# Patient Record
Sex: Female | Born: 1950
Health system: Southern US, Community
[De-identification: ages and names within clinical notes are randomized; demographics above are authoritative.]

## PROBLEM LIST (undated history)

## (undated) DIAGNOSIS — R51 Headache: Secondary | ICD-10-CM

## (undated) DIAGNOSIS — Z79899 Other long term (current) drug therapy: Secondary | ICD-10-CM

## (undated) DIAGNOSIS — G935 Compression of brain: Secondary | ICD-10-CM

## (undated) DIAGNOSIS — R9082 White matter disease, unspecified: Secondary | ICD-10-CM

## (undated) DIAGNOSIS — F411 Generalized anxiety disorder: Secondary | ICD-10-CM

## (undated) DIAGNOSIS — R413 Other amnesia: Secondary | ICD-10-CM

## (undated) DIAGNOSIS — M949 Disorder of cartilage, unspecified: Secondary | ICD-10-CM

## (undated) DIAGNOSIS — F32A Depression, unspecified: Secondary | ICD-10-CM

## (undated) DIAGNOSIS — N951 Menopausal and female climacteric states: Secondary | ICD-10-CM

## (undated) DIAGNOSIS — M1990 Unspecified osteoarthritis, unspecified site: Secondary | ICD-10-CM

## (undated) DIAGNOSIS — K219 Gastro-esophageal reflux disease without esophagitis: Secondary | ICD-10-CM

## (undated) DIAGNOSIS — R519 Headache, unspecified: Secondary | ICD-10-CM

## (undated) DIAGNOSIS — G5600 Carpal tunnel syndrome, unspecified upper limb: Secondary | ICD-10-CM

## (undated) DIAGNOSIS — M899 Disorder of bone, unspecified: Secondary | ICD-10-CM

## (undated) DIAGNOSIS — M8580 Other specified disorders of bone density and structure, unspecified site: Secondary | ICD-10-CM

## (undated) DIAGNOSIS — G4700 Insomnia, unspecified: Secondary | ICD-10-CM

## (undated) DIAGNOSIS — F329 Major depressive disorder, single episode, unspecified: Secondary | ICD-10-CM

## (undated) HISTORY — DX: Carpal tunnel syndrome, unspecified upper limb: G56.00

## (undated) HISTORY — DX: Insomnia, unspecified: G47.00

## (undated) HISTORY — PX: CARPAL TUNNEL RELEASE: SHX101

## (undated) HISTORY — DX: Menopausal and female climacteric states: N95.1

## (undated) HISTORY — DX: Headache: R51

## (undated) HISTORY — DX: Other amnesia: R41.3

## (undated) HISTORY — DX: Compression of brain: G93.5

## (undated) HISTORY — PX: COLONOSCOPY: SHX174

## (undated) HISTORY — DX: White matter disease, unspecified: R90.82

## (undated) HISTORY — DX: Disorder of bone, unspecified: M89.9

## (undated) HISTORY — PX: APPENDECTOMY: SHX54

## (undated) HISTORY — DX: Gastro-esophageal reflux disease without esophagitis: K21.9

## (undated) HISTORY — PX: CATARACT EXTRACTION, BILATERAL: SHX1313

## (undated) HISTORY — DX: Generalized anxiety disorder: F41.1

## (undated) HISTORY — DX: Other specified disorders of bone density and structure, unspecified site: M85.80

## (undated) HISTORY — DX: Disorder of cartilage, unspecified: M94.9

## (undated) HISTORY — DX: Other long term (current) drug therapy: Z79.899

## (undated) HISTORY — DX: Headache, unspecified: R51.9

---

## 1999-05-02 ENCOUNTER — Other Ambulatory Visit: Admission: RE | Admit: 1999-05-02 | Discharge: 1999-05-02 | Payer: Self-pay | Admitting: *Deleted

## 2000-05-06 ENCOUNTER — Other Ambulatory Visit: Admission: RE | Admit: 2000-05-06 | Discharge: 2000-05-06 | Payer: Self-pay | Admitting: *Deleted

## 2001-06-09 ENCOUNTER — Other Ambulatory Visit: Admission: RE | Admit: 2001-06-09 | Discharge: 2001-06-09 | Payer: Self-pay | Admitting: *Deleted

## 2002-06-16 ENCOUNTER — Encounter: Admission: RE | Admit: 2002-06-16 | Discharge: 2002-06-16 | Payer: Self-pay | Admitting: *Deleted

## 2005-07-10 ENCOUNTER — Encounter: Admission: RE | Admit: 2005-07-10 | Discharge: 2005-07-10 | Payer: Self-pay | Admitting: Obstetrics and Gynecology

## 2006-09-25 ENCOUNTER — Encounter: Admission: RE | Admit: 2006-09-25 | Discharge: 2006-09-25 | Payer: Self-pay | Admitting: Obstetrics and Gynecology

## 2007-10-09 ENCOUNTER — Encounter: Admission: RE | Admit: 2007-10-09 | Discharge: 2007-10-09 | Payer: Self-pay | Admitting: Obstetrics and Gynecology

## 2008-10-25 ENCOUNTER — Encounter: Admission: RE | Admit: 2008-10-25 | Discharge: 2008-10-25 | Payer: Self-pay | Admitting: Obstetrics and Gynecology

## 2009-10-20 ENCOUNTER — Encounter: Admission: RE | Admit: 2009-10-20 | Discharge: 2009-10-20 | Payer: Self-pay | Admitting: Obstetrics and Gynecology

## 2010-11-20 ENCOUNTER — Other Ambulatory Visit: Payer: Self-pay | Admitting: Obstetrics and Gynecology

## 2010-11-20 DIAGNOSIS — Z1231 Encounter for screening mammogram for malignant neoplasm of breast: Secondary | ICD-10-CM

## 2010-11-22 ENCOUNTER — Ambulatory Visit: Payer: Self-pay

## 2010-11-22 ENCOUNTER — Ambulatory Visit
Admission: RE | Admit: 2010-11-22 | Discharge: 2010-11-22 | Disposition: A | Discharge status: Home / Self Care | Payer: BC Managed Care – PPO | Source: Ambulatory Visit | Attending: Obstetrics and Gynecology | Admitting: Obstetrics and Gynecology

## 2010-11-22 DIAGNOSIS — Z1231 Encounter for screening mammogram for malignant neoplasm of breast: Secondary | ICD-10-CM

## 2011-10-24 ENCOUNTER — Other Ambulatory Visit: Payer: Self-pay | Admitting: Obstetrics and Gynecology

## 2011-10-24 DIAGNOSIS — Z1231 Encounter for screening mammogram for malignant neoplasm of breast: Secondary | ICD-10-CM

## 2011-11-23 ENCOUNTER — Ambulatory Visit
Admission: RE | Admit: 2011-11-23 | Discharge: 2011-11-23 | Disposition: A | Discharge status: Home / Self Care | Source: Ambulatory Visit | Attending: Obstetrics and Gynecology | Admitting: Obstetrics and Gynecology

## 2011-11-23 DIAGNOSIS — Z1231 Encounter for screening mammogram for malignant neoplasm of breast: Secondary | ICD-10-CM

## 2012-11-10 ENCOUNTER — Other Ambulatory Visit: Payer: Self-pay

## 2012-11-10 DIAGNOSIS — Z1231 Encounter for screening mammogram for malignant neoplasm of breast: Secondary | ICD-10-CM

## 2012-12-03 ENCOUNTER — Ambulatory Visit
Admission: RE | Admit: 2012-12-03 | Discharge: 2012-12-03 | Disposition: A | Discharge status: Home / Self Care | Source: Ambulatory Visit

## 2012-12-03 DIAGNOSIS — Z1231 Encounter for screening mammogram for malignant neoplasm of breast: Secondary | ICD-10-CM

## 2013-09-25 ENCOUNTER — Other Ambulatory Visit: Payer: Self-pay | Admitting: Family Medicine

## 2013-09-25 DIAGNOSIS — R413 Other amnesia: Secondary | ICD-10-CM

## 2013-09-29 ENCOUNTER — Other Ambulatory Visit: Payer: Self-pay | Admitting: Family Medicine

## 2013-09-29 DIAGNOSIS — Z139 Encounter for screening, unspecified: Secondary | ICD-10-CM

## 2013-10-02 ENCOUNTER — Ambulatory Visit
Admission: RE | Admit: 2013-10-02 | Discharge: 2013-10-02 | Disposition: A | Discharge status: Home / Self Care | Source: Ambulatory Visit | Attending: Family Medicine | Admitting: Family Medicine

## 2013-10-02 DIAGNOSIS — Z139 Encounter for screening, unspecified: Secondary | ICD-10-CM

## 2013-10-02 DIAGNOSIS — R413 Other amnesia: Secondary | ICD-10-CM

## 2013-10-02 MED ORDER — GADOBENATE DIMEGLUMINE 529 MG/ML IV SOLN
10.0000 mL | Freq: Once | INTRAVENOUS | Status: AC | PRN
  Administered 2013-10-02: 10 mL via INTRAVENOUS

## 2013-11-30 ENCOUNTER — Ambulatory Visit: Admitting: Neurology

## 2013-12-02 ENCOUNTER — Encounter: Payer: Self-pay | Admitting: Neurology

## 2013-12-03 ENCOUNTER — Ambulatory Visit (INDEPENDENT_AMBULATORY_CARE_PROVIDER_SITE_OTHER): Admitting: Neurology

## 2013-12-03 ENCOUNTER — Encounter (INDEPENDENT_AMBULATORY_CARE_PROVIDER_SITE_OTHER): Payer: Self-pay

## 2013-12-03 ENCOUNTER — Encounter: Payer: Self-pay | Admitting: *Deleted

## 2013-12-03 VITALS — BP 117/69 | HR 87 | Ht 59.0 in | Wt 129.0 lb

## 2013-12-03 DIAGNOSIS — R413 Other amnesia: Secondary | ICD-10-CM

## 2013-12-03 DIAGNOSIS — R51 Headache: Secondary | ICD-10-CM

## 2013-12-03 DIAGNOSIS — G4700 Insomnia, unspecified: Secondary | ICD-10-CM

## 2013-12-03 DIAGNOSIS — H538 Other visual disturbances: Secondary | ICD-10-CM

## 2013-12-03 DIAGNOSIS — G935 Compression of brain: Secondary | ICD-10-CM

## 2013-12-03 NOTE — Patient Instructions (Addendum)
You can discuss increasing the Aricept to 10 mg once daily.  Common side effects include dry eyes, dry mouth, confusion, low pulse, low blood pressure and rare side effects include hallucinations.   You have complaints of memory loss: memory loss or changes in cognitive function can have many reasons and does not always mean you have dementia. Conditions that can contribute to subjective or objective memory loss include: depression, stress, poor sleep from insomnia or sleep apnea, dehydration, fluctuation in blood sugar values, thyroid or electrolyte dysfunction. Dementia can be causes by stroke, brain atherosclerosis and by Alzheimer's disease or other, more rare and sometimes hereditary causes.  We can consider formal memory testing as well. I can refer you for neuropsychological testing if you wish or you can discuss with Mr. Tobie Lords.   Please remember, common headache triggers are: sleep deprivation, dehydration, overheating, stress, hypoglycemia or skipping meals and blood sugar fluctuations, excessive pain medications or excessive alcohol use or caffeine withdrawal. Some people have food triggers such as aged cheese, orange juice or chocolate, especially dark chocolate, or MSG (monosodium glutamate). Try to avoid these headache triggers as much possible. It may be helpful to keep a headache diary to figure out what makes your headaches worse or brings them on and what alleviates them. Some people report headache onset after exercise but studies have shown that regular exercise may actually prevent headaches from coming. If you have exercise-induced headaches, please make sure that you drink plenty of fluid before and after exercising and that you do not over do it and do not overheat.  Please remember to try to maintain good sleep hygiene, which means: Keep a regular sleep and wake schedule, try not to exercise or have a meal within 2 hours of your bedtime, try to keep your bedroom conducive for sleep,  that is, cool and dark, without light distractors such as an illuminated alarm clock, and refrain from watching TV right before sleep or in the middle of the night and do not keep the TV or radio on during the night. Also, try not to use or play on electronic devices at bedtime, such as your cell phone, tablet PC or laptop. If you like to read at bedtime on an electronic device, try to dim the background light as much as possible. Do not eat in the middle of the night.   Please make an appointment with your eye doctor.    I can see you back as needed.

## 2013-12-03 NOTE — Progress Notes (Signed)
Subjective:    Patient ID: Carly Larson is a 63 y.o. female.  HPI    Star Age, MD, PhD [facility] [address], [address] [place], [place] [address]   Dear Ovid Curd,  I saw your patient, Carly Larson, upon your kind request in my neurologic clinic today for initial consultation of her memory loss. The patient is accompanied by her husband today. As you know, Carly Larson is a 63 year old right-handed woman with an underlying medical history of reflux disease, carpal tunnel syndrome, osteopenia, anxiety, and vitamin D deficiency, who has had memory loss for over 2 years. She has been on Aricept 5 mg since March 2015. According to your office note from 11/11/2013 her MMSE in May 2013 was 24/30 and in March 2015 was 30 out of 30 but subjectively she felt that her memory was getting worse, which is why you started her on low-dose Aricept. After this she felt stabilized or perhaps a little improved. She had a brain MRI with and without contrast on 10/02/2013: The cerebellar tonsils extend 5 mm below the foramen magnum. Below there some around, the posterior fossa appear small. This suggests a mild Chiari 1 malformation. 2. Mild periventricular and subcortical white matter changes slightly advanced for age. The finding is nonspecific but can be seen in the setting of chronic microvascular ischemia, a demyelinating process such as multiple sclerosis, vasculitis, complicated migraine headaches, or as the sequelae of a prior infectious or inflammatory process. In addition, I personally reviewed the images through the PACS system.   I reviewed her labs which included normal CMP, normal TSH, normal CBC, vitamin B12 of 1185, ESR of 2, normal vitamin D.   She reports recurrent headaches. She describes a pounding HA in the top and front of her head, associated with some nausea and vomiting, and she had a total of 3 severe HAs since mid-April. She had spots in her vision. She  has no photophobia or sonophobia.  She has trouble going to sleep and takes an OTC sleep aid each night. She endorses stress. She works fulltime as a Psychiatric nurse. She describes thoughts racing. She has been on Effexor 75 mg 1 in AM and 2 at night.  There is no report of Auditory Hallucinations and Visual Hallucinations and there are no delusions, such as paranoia.  She has not been on any dementia medications. She has no symptoms of depression and denies suicidal ideations or homicidal ideations.  The patient denies prior TIA or stroke symptoms, such as sudden onset of one sided weakness, numbness, tingling, slurring of speech or droopy face, hearing loss, tinnitus, diplopia or visual field cut or monocular loss of vision.  Of note, the patient is reported to snore mildly but there is no report of witnessed apneas or choking sensations while asleep.  Her mother has Alzheimer's disease and is 61 years old. Her father passed away. He had a stroke. She has one daughter. She has been driving and has had no problems driving such as getting lost or misjudging. Her husband endorses that she drives well.  Her Past Medical History Is Significant For: Past Medical History  Diagnosis Date  . Encounter for long-term (current) use of other medications   . Esophageal reflux   . Carpal tunnel syndrome   . Disorder of bone and cartilage, unspecified   . Anxiety state, unspecified   . Symptomatic menopausal or female climacteric states   . Osteopenia     Hx of vitamin  D deficiency  . Memory loss     MMSE 24/30 on 12/05/11, 30/30 on 10/25/13    His Past Surgical History Is Significant For: Past Surgical History  Procedure Laterality Date  . Appendectomy      Her Family History Is Significant For: Family History  Problem Relation Age of Onset  . Breast cancer Maternal Aunt   . Diabetes Mother   . Osteoarthritis Mother   . Hypertension Mother   . Dementia Mother   . Stroke Father     Her Social History  Is Significant For: History   Social History  . Marital Status: Married    Spouse Name: Jeani Hawking     Number of Children: 1  . Years of Education: 12+   Occupational History  .     Social History Main Topics  . Smoking status: Never Smoker   . Smokeless tobacco: Never Used  . Alcohol Use: None  . Drug Use: None  . Sexual Activity: None   Other Topics Concern  . None   Social History Narrative   Patient lives at home with husband. Jeani Hawking   Patient has 1 child.    Patient is left handed.    Patient is currently working    Patient has a college education     Her Allergies Are:  No Known Allergies:   Her Current Medications Are:  Outpatient Encounter Prescriptions as of 12/03/2013  Medication Sig  . acyclovir ointment (ZOVIRAX) 5 % Apply 1 application topically every 3 (three) hours.  Marland Kitchen aspirin 81 MG tablet Take 81 mg by mouth daily.  . B Complex Vitamins (VITAMIN B-COMPLEX PO) Take 1 tablet by mouth daily.  . Cholecalciferol (VITAMIN D-3) 1000 UNITS CAPS Take 1 capsule by mouth 2 (two) times daily.  Marland Kitchen donepezil (ARICEPT) 5 MG tablet Take 5 mg by mouth at bedtime.  Marland Kitchen omeprazole (PRILOSEC) 20 MG capsule Take 20 mg by mouth daily.  Marland Kitchen POTASSIUM PO Take 1 tablet by mouth 2 (two) times daily.  Marland Kitchen venlafaxine (EFFEXOR) 75 MG tablet Take 75 mg by mouth 3 (three) times daily with meals.  :  Review of Systems:  Out of a complete 14 point review of systems, all are reviewed and negative with the exception of these symptoms as listed below:   Review of Systems  Eyes:       Loss of vision right eye  Endocrine: Positive for heat intolerance.       Flushing   Allergic/Immunologic: Positive for environmental allergies.       Runny nose   Neurological: Positive for headaches.       Memory loss, confusion  Psychiatric/Behavioral: Positive for confusion and sleep disturbance.    Objective:  Neurologic Exam  Physical Exam Physical Examination:   Filed Vitals:   12/03/13 1310  BP:  117/69  Pulse: 87    General Examination: The patient is a very pleasant 63 y.o. female in no acute distress. She is calm and cooperative with the exam. She denies Auditory Hallucinations and Visual Hallucinations. She is well groomed and situated in a chair.   HEENT: Normocephalic, atraumatic, pupils are equal, round and reactive to light and accommodation. Funduscopic exam is normal with sharp disc margins noted. Extraocular tracking shows no saccadic breakdown without nystagmus noted. Hearing is intact. Tympanic membranes are clear bilaterally. Face is symmetric with no facial masking and normal facial sensation. There is no lip, neck or jaw tremor. Neck is not rigid with intact passive ROM. There  are no carotid bruits on auscultation. Oropharynx exam reveals mild mouth dryness. No significant airway crowding is noted. Mallampati is class I. Tongue protrudes centrally and palate elevates symmetrically.    Chest: is clear to auscultation without wheezing, rhonchi or crackles noted.  Heart: sounds are regular and normal without murmurs, rubs or gallops noted.   Abdomen: is soft, non-tender and non-distended with normal bowel sounds appreciated on auscultation.  Extremities: There is no pitting edema in the distal lower extremities bilaterally. Pedal pulses are intact.   Skin: is warm and dry with no trophic changes noted. Age-related changes are noted on the skin.   Musculoskeletal: exam reveals no obvious joint deformities, tenderness or joint swelling or erythema.   Neurologically:  Mental status: The patient is awake and alert, paying good  attention. She is able to provide the history. Her husband provides details. She is oriented to: person, place, time/date, situation, day of week, month of year and year. Her memory, attention, language and knowledge are fairly good. There is no aphasia, agnosia, apraxia or anomia. There is a no significant degree of bradyphrenia. Speech is not hypophonic  with no dysarthria noted. Mood is congruent and affect is normal.  Her MMSE (Mini-Mental state exam) score is 28/30. AFT was 15/min.    Cranial nerves are as described above under HEENT exam. In addition, shoulder shrug is normal with equal shoulder height noted.  Motor exam: Normal bulk, and strength for age is noted. Tone is not rigid with absence of cogwheeling in the bilateral extremities. There is overall no bradykinesia. There is no drift or rebound. There is no tremor.   Romberg is negative. Reflexes are 2+ in the upper extremities and 3+ in the lower extremities. Toes are downgoing bilaterally. Fine motor skills: Finger taps, hand movements, and rapid alternating patting are not impaired bilaterally. Foot taps and foot agility are not impaired bilaterally.   Cerebellar testing shows no dysmetria or intention tremor on finger to nose testing. Heel to shin is unremarkable. There is no truncal or gait ataxia.   Sensory exam is intact to light touch, pinprick, vibration, temperature sense and proprioception in the upper and lower extremities.   Gait, station and balance: She stands up from the seated position with no difficulty. No veering to one side is noted. No leaning to one side. Posture is age-appropriate, not stooped. Stance is narrow-based. She turns en bloc. Tandem walk is not possible. Balance is not  impaired.   Assessment and Plan:   In summary, Breely R Bembenek is a very pleasant 63 y.o.-year old female with an underlying medical history of reflux disease, carpal tunnel syndrome, osteopenia, anxiety, and vitamin D deficiency, who has had memory loss for over 2 years. Her history and physical exam are in keeping with mild memory loss, probably in keeping with mild cognitive impairment or early dementia. I discussed her MRI findings with her. I explained Chiari 1 malformation to her. Her headaches may be late onset migraines. She certainly does not have any focality on exam and does not  endorse any TIA-type symptoms. Nevertheless because she has had blurry vision I asked her to have her eyes checked. She has not seen her eye doctor in 3 or 4 years she states. At this juncture, I advised her that she can go ahead and increase the Aricept with full dose to be on a maintenance dose. She would like to discuss this with you first. We also talked about potentially sending her for full  or formal memory testing in the form of neuropsychological evaluation. She would like to hold off and discuss this with you as well. At this juncture, I can see her back on an as-needed basis. We discussed her recent blood test findings, her MRI results and today's findings in detail. I answered all their questions. We talked about sleep hygiene and I also advised her about potential headache triggers. She was given instructions in writing.   Thank you very much for allowing me to participate in the care of this nice patient. If I can be of any further assistance to you please do not hesitate to call me at [phone].  Sincerely,   Star Age, MD, PhD

## 2014-01-07 ENCOUNTER — Other Ambulatory Visit: Payer: Self-pay | Admitting: Physician Assistant

## 2014-01-07 ENCOUNTER — Other Ambulatory Visit: Payer: Self-pay

## 2014-01-07 DIAGNOSIS — Z1231 Encounter for screening mammogram for malignant neoplasm of breast: Secondary | ICD-10-CM

## 2014-01-19 ENCOUNTER — Ambulatory Visit
Admission: RE | Admit: 2014-01-19 | Discharge: 2014-01-19 | Disposition: A | Discharge status: Home / Self Care | Source: Ambulatory Visit | Attending: Physician Assistant | Admitting: Physician Assistant

## 2014-01-19 DIAGNOSIS — Z1231 Encounter for screening mammogram for malignant neoplasm of breast: Secondary | ICD-10-CM

## 2014-10-26 ENCOUNTER — Ambulatory Visit: Admitting: Neurology

## 2014-10-26 ENCOUNTER — Telehealth: Payer: Self-pay

## 2014-10-26 NOTE — Telephone Encounter (Signed)
Patient did not show to appt today  

## 2014-10-27 ENCOUNTER — Encounter: Payer: Self-pay | Admitting: Neurology

## 2014-11-03 ENCOUNTER — Other Ambulatory Visit: Payer: Self-pay

## 2014-11-23 ENCOUNTER — Ambulatory Visit (INDEPENDENT_AMBULATORY_CARE_PROVIDER_SITE_OTHER): Payer: Medicare Other | Admitting: Neurology

## 2014-11-23 ENCOUNTER — Encounter: Payer: Self-pay | Admitting: Neurology

## 2014-11-23 VITALS — BP 132/70 | HR 102 | Resp 18 | Ht 59.0 in | Wt 116.0 lb

## 2014-11-23 DIAGNOSIS — R413 Other amnesia: Secondary | ICD-10-CM

## 2014-11-23 DIAGNOSIS — G935 Compression of brain: Secondary | ICD-10-CM | POA: Diagnosis not present

## 2014-11-23 NOTE — Patient Instructions (Signed)
I think overall you are doing fairly well but I do want to suggest a few things today:  Remember to drink plenty of fluid, eat healthy meals and do not skip any meals. Try to eat protein with a every meal and eat a healthy snack such as fruit or nuts in between meals. Try to keep a regular sleep-wake schedule and try to exercise daily, particularly in the form of walking, 20-30 minutes a day, if you can. Good nutrition, proper sleep and exercise can help her cognitive function.  Engage in social activities in your community and with your family and try to keep up with current events by reading the newspaper or watching the news. If you have computer and can go online, try [email or web address]. Also, you may like to do word finding puzzles or crossword puzzles.  As far as your medications are concerned, I would like to suggest: consider, changing namenda 10 mg 2 times a day to once daily longacting Namenda XR 28 mg.    As far as diagnostic testing: I would like for you to consider a more detailed memory test, called cognitive testing with a licensed neuropsychologist. Let me know. I can make a referral.   I would like to see you back in 6 months. Please call us with any interim questions, concerns, problems, updates or refill requests.

## 2014-11-23 NOTE — Progress Notes (Signed)
Subjective:    Patient ID: Carly Larson is a 64 y.o. female.  HPI     Interim history:   Carly Larson is a 64 year old right-handed woman with an underlying medical history of reflux disease, carpal tunnel syndrome, osteopenia, anxiety, and vitamin D deficiency, who presents for follow-up consultation of her memory loss of over 2 years duration. The patient is unaccompanied today. I first met her on 12/03/2013 at the request of her primary care provider, at which time she reported a 2 year history of memory loss. She had been on Aricept 5 mg since March 2015. Her MMSE was 28 out of 30 at the time. I encouraged her to increase Aricept to 10 mg. We talked about sending her for formal neuropsychological evaluation but she chose to hold off and wanted to discuss the increase in her Aricept with her primary care provider first as well. I suggested an as needed follow-up. Of note, the patient did not show for an appointment on 10/26/2014.   Today, 11/23/2014: She reports that her memory is worse. Her husband gives details and reports, that in the last year she has had a significant decline in her short-term memory. She perpetually misplaces things and forgets in particular where she put her keys and her glasses or cell phone. I reviewed an office note from her primary care physician from 09/10/2014. She is currently on Aricept 10 mg generic once daily and she has also been started on Namenda which is currently 10 mg twice daily. As I understand she may have started this about 6 months ago. She seems to tolerate her medications well. She sleeps fairly well. She does not report any other physical complaints or problems. She still works as a Psychiatric nurse. she still drives and there have not been any significant issues driving. However, they have not been driving any longer distances lately. If there is longer distance driving needed, her husband typically takes care of that. She has one daughter, age 30 who she reports  is also concerned about her memory. Her mother has memory issues.   Previously:   She has had memory loss for over 2 years. She has been on Aricept 5 mg since March 2015. According to your office note from 11/11/2013 her MMSE in May 2013 was 24/30 and in March 2015 was 30 out of 30 but subjectively she felt that her memory was getting worse, which is why you started her on low-dose Aricept. After this she felt stabilized or perhaps a little improved. She had a brain MRI with and without contrast on 10/02/2013: The cerebellar tonsils extend 5 mm below the foramen magnum. Below there some around, the posterior fossa appear small. This suggests a mild Chiari 1 malformation. 2. Mild periventricular and subcortical white matter changes slightly advanced for age. The finding is nonspecific but can be seen in the setting of chronic microvascular ischemia, a demyelinating process such as multiple sclerosis, vasculitis, complicated migraine headaches, or as the sequelae of a prior infectious or inflammatory process.  In addition, I personally reviewed the images through the PACS system.    I reviewed her labs which included normal CMP, normal TSH, normal CBC, vitamin B12 of 1185, ESR of 2, normal vitamin D.   She reports recurrent headaches. She describes a pounding HA in the top and front of her head, associated with some nausea and vomiting, and she had a total of 3 severe HAs since mid-April. She had spots in her vision. She has no  photophobia or sonophobia.   She has trouble going to sleep and takes an OTC sleep aid each night. She endorses stress. She works fulltime as a Psychiatric nurse. She describes thoughts racing. She has been on Effexor 75 mg 1 in AM and 2 at night.   There is no report of Auditory Hallucinations and Visual Hallucinations and there are no delusions, such as paranoia.   She has not been on any dementia medications. She has no symptoms of depression and denies suicidal ideations or homicidal  ideations.   The patient denies prior TIA or stroke symptoms, such as sudden onset of one sided weakness, numbness, tingling, slurring of speech or droopy face, hearing loss, tinnitus, diplopia or visual field cut or monocular loss of vision.  Of note, the patient is reported to snore mildly but there is no report of witnessed apneas or choking sensations while asleep.   Her mother has Alzheimer's disease and is 56 years old. Her father passed away. He had a stroke. She has one daughter. She has been driving and has had no problems driving such as getting lost or misjudging. Her husband endorses that she drives well.   Her Past Medical History Is Significant For: Past Medical History  Diagnosis Date  . Encounter for long-term (current) use of other medications   . Esophageal reflux   . Carpal tunnel syndrome   . Disorder of bone and cartilage, unspecified   . Anxiety state, unspecified   . Symptomatic menopausal or female climacteric states   . Osteopenia     Hx of vitamin D deficiency  . Memory loss     MMSE 24/30 on 12/05/11, 30/30 on 10/25/13  . Menopausal symptoms   . Memory loss   . Insomnia   . Chiari malformation type I   . Carpal tunnel syndrome, bilateral   . Headache   . Osteopenia   . White matter abnormality on MRI of brain   . High risk medication use     Her Past Surgical History Is Significant For: Past Surgical History  Procedure Laterality Date  . Appendectomy      Her Family History Is Significant For: Family History  Problem Relation Age of Onset  . Breast cancer Maternal Aunt   . Diabetes Mother   . Osteoarthritis Mother   . Hypertension Mother   . Dementia Mother   . Stroke Father   . Heart attack Brother   . CAD Brother     Her Social History Is Significant For: History   Social History  . Marital Status: Married    Spouse Name: Jeani Hawking   . Number of Children: 1  . Years of Education: 12+   Occupational History  . Florist    Social History  Main Topics  . Smoking status: Never Smoker   . Smokeless tobacco: Never Used  . Alcohol Use: No  . Drug Use: No  . Sexual Activity: Not on file   Other Topics Concern  . None   Social History Narrative   Patient lives at home with husband. Jeani Hawking   Patient has 1 child.    Patient is left handed.    Patient is currently working    Patient has a college education    3 cups of coffee a day     Her Allergies Are:  No Known Allergies:   Her Current Medications Are:  Outpatient Encounter Prescriptions as of 11/23/2014  Medication Sig  . acyclovir ointment (ZOVIRAX) 5 % Apply 1  application topically every 3 (three) hours.  Marland Kitchen alendronate (FOSAMAX) 70 MG tablet Take 70 mg by mouth once a week. Take with a full glass of water on an empty stomach.  . Apoaequorin 10 MG CAPS Take 10 mg by mouth.  Marland Kitchen aspirin 81 MG tablet Take 81 mg by mouth daily.  . B Complex Vitamins (VITAMIN B-COMPLEX PO) Take 1 tablet by mouth daily.  . Cholecalciferol (VITAMIN D-3) 1000 UNITS CAPS Take 1 capsule by mouth 2 (two) times daily.  Marland Kitchen donepezil (ARICEPT) 5 MG tablet Take 5 mg by mouth at bedtime.  . meloxicam (MOBIC) 15 MG tablet Take 15 mg by mouth daily.  . memantine (NAMENDA) 10 MG tablet Take 10 mg by mouth 2 (two) times daily.  . Multiple Vitamin (MULTIVITAMIN) capsule Take 1 capsule by mouth daily.  Marland Kitchen omeprazole (PRILOSEC) 20 MG capsule Take 20 mg by mouth daily.  Marland Kitchen POTASSIUM PO Take 1 tablet by mouth 2 (two) times daily.  . temazepam (RESTORIL) 30 MG capsule Take 30 mg by mouth at bedtime as needed for sleep.  . traMADol (ULTRAM) 50 MG tablet Take by mouth 2 (two) times daily.  Marland Kitchen venlafaxine (EFFEXOR) 75 MG tablet Take 75 mg by mouth 3 (three) times daily with meals.   No facility-administered encounter medications on file as of 11/23/2014.  :  Review of Systems:  Out of a complete 14 point review of systems, all are reviewed and negative with the exception of these symptoms as listed below:   Review  of Systems  HENT: Positive for rhinorrhea.   Gastrointestinal: Positive for constipation.  Endocrine:       Feeling hot   Musculoskeletal:       Joint swelling, Aching muscles   Allergic/Immunologic: Positive for environmental allergies.  Neurological: Positive for headaches.       Memory loss    Objective:  Neurologic Exam  Physical Exam Physical Examination:   Filed Vitals:   11/23/14 1333  BP: 132/70  Pulse: 102  Resp: 18   General Examination: The patient is a very pleasant 64 y.o. female in no acute distress. She is calm and cooperative with the exam. She denies Auditory Hallucinations and Visual Hallucinations. She is well groomed and situated in a chair. She is slightly anxious appearing. She is in good spirits today.   HEENT: Normocephalic, atraumatic, pupils are equal, round and reactive to light and accommodation. Funduscopic exam is normal with sharp disc margins noted. Extraocular tracking shows no saccadic breakdown without nystagmus noted. Hearing is intact. Tympanic membranes are clear bilaterally. Face is symmetric with no facial masking and normal facial sensation. There is no lip, neck or jaw tremor. Neck is not rigid with intact passive ROM. There are no carotid bruits on auscultation. Oropharynx exam reveals mild mouth dryness. No significant airway crowding is noted. Mallampati is class I. Tongue protrudes centrally and palate elevates symmetrically.    Chest: is clear to auscultation without wheezing, rhonchi or crackles noted.  Heart: sounds are regular and normal without murmurs, rubs or gallops noted.   Abdomen: is soft, non-tender and non-distended with normal bowel sounds appreciated on auscultation.  Extremities: There is no pitting edema in the distal lower extremities bilaterally. Pedal pulses are intact.   Skin: is warm and dry with no trophic changes noted. Age-related changes are noted on the skin.   Musculoskeletal: exam reveals no obvious joint  deformities, tenderness or joint swelling or erythema.   Neurologically:  Mental status: The patient is  awake and alert, paying good  attention. She is able to provide the history. Her husband provides details. She is oriented to: person, place, time/date, situation, day of week, month of year and year. Her memory, attention, language and knowledge are fairly good. There is no aphasia, agnosia, apraxia or anomia. There is a no significant degree of bradyphrenia. Speech is not hypophonic with no dysarthria noted. Mood is congruent and affect is normal.   In 5/15: Her MMSE (Mini-Mental state exam) score is 28/30. AFT was 15/min.    11/23/2014: MMSE: 24/30, CDT: 4/4, AFT: 14/min.  Cranial nerves are as described above under HEENT exam. In addition, shoulder shrug is normal with equal shoulder height noted.  Motor exam: Normal bulk, and strength for age is noted. Tone is not rigid with absence of cogwheeling in the bilateral extremities. There is overall no bradykinesia. There is no drift or rebound. There is no tremor.   Romberg is negative. Reflexes are 2+ in the upper extremities and 3+ in the lower extremities. Toes are downgoing bilaterally. Fine motor skills: Finger taps, hand movements, and rapid alternating patting are not impaired bilaterally. Foot taps and foot agility are not impaired bilaterally.   Cerebellar testing shows no dysmetria or intention tremor on finger to nose testing. Heel to shin is unremarkable. There is no truncal or gait ataxia.   Sensory exam is intact to light touch in the upper and lower extremities.   Gait, station and balance: She stands up from the seated position with no difficulty. No veering to one side is noted. No leaning to one side. Posture is age-appropriate, not stooped. Stance is narrow-based. She turns en bloc.   Assessment and Plan:   In summary, Tiffney R Messimer is a very pleasant 64 year old female with an underlying medical history of reflux disease,  carpal tunnel syndrome, osteopenia, anxiety, and vitamin D deficiency, who has had memory loss for over 3 years. her memory scores have declined since I first met her a year ago. She's currently on Aricept 10 mg once daily and Namenda 10 mg twice daily. I suggested we proceed with further more detailed memory testing in the form of neuropsychological evaluation with a licensed neuropsychologist. We discussed her prior MRI findings from March 2015 again today. Her physical exam and neurological exam are otherwise nonfocal and I reassured her in that regard. I discussed changing her Namenda 10 mg twice daily to Namenda long-acting 28 mg once daily. She would like to discuss this with her primary care provider first. She is not keen on having more testing done at this time and would like to think about it first. I had suggested cognitive testing last year but she wanted to hold off. Her husband reports that she has become worse with her memory perpetually. She may even be able to switch to Namzaric once daily instead of taking Aricept and Namenda separately. Again, she would like to check with her primary care provider with regards to any medication changes. I would like to see her back for recheck in 6 months, sooner if the need arises. I did ask her to try to stay active mentally and physically. I asked her to eat healthy and drink plenty of fluid. I did not make any changes to her medications today and I answered all their questions today and the patient and her husband were in agreement.  I spent 25 minutes in total face-to-face time with the patient, more than 50% of which was spent in  counseling and coordination of care, reviewing test results, reviewing medication and discussing or reviewing the diagnosis of dementia and memory loss, its prognosis and treatment options.

## 2014-12-16 DIAGNOSIS — R930 Abnormal findings on diagnostic imaging of skull and head, not elsewhere classified: Secondary | ICD-10-CM | POA: Diagnosis not present

## 2014-12-16 DIAGNOSIS — R413 Other amnesia: Secondary | ICD-10-CM | POA: Diagnosis not present

## 2014-12-16 DIAGNOSIS — Z0000 Encounter for general adult medical examination without abnormal findings: Secondary | ICD-10-CM | POA: Diagnosis not present

## 2014-12-16 DIAGNOSIS — Z23 Encounter for immunization: Secondary | ICD-10-CM | POA: Diagnosis not present

## 2014-12-16 DIAGNOSIS — Z6822 Body mass index (BMI) 22.0-22.9, adult: Secondary | ICD-10-CM | POA: Diagnosis not present

## 2014-12-16 DIAGNOSIS — Z136 Encounter for screening for cardiovascular disorders: Secondary | ICD-10-CM | POA: Diagnosis not present

## 2014-12-16 DIAGNOSIS — Z79899 Other long term (current) drug therapy: Secondary | ICD-10-CM | POA: Diagnosis not present

## 2014-12-16 DIAGNOSIS — M8580 Other specified disorders of bone density and structure, unspecified site: Secondary | ICD-10-CM | POA: Diagnosis not present

## 2015-01-04 DIAGNOSIS — L570 Actinic keratosis: Secondary | ICD-10-CM | POA: Diagnosis not present

## 2015-01-04 DIAGNOSIS — L918 Other hypertrophic disorders of the skin: Secondary | ICD-10-CM | POA: Diagnosis not present

## 2015-01-04 DIAGNOSIS — L821 Other seborrheic keratosis: Secondary | ICD-10-CM | POA: Diagnosis not present

## 2015-02-22 ENCOUNTER — Telehealth: Payer: Self-pay

## 2015-02-22 NOTE — Telephone Encounter (Signed)
I left a message for the patient to return my call.

## 2015-03-02 ENCOUNTER — Telehealth: Payer: Self-pay

## 2015-03-02 NOTE — Telephone Encounter (Signed)
The patient returned my called, and I presented the CREAD study to her. The patient wanted to know more information about the study and discuss it with her spouse before making a decision. I will mail the Informed Consent Form (ICF), and I will check with the patient in a week.

## 2015-03-07 ENCOUNTER — Telehealth: Payer: Self-pay

## 2015-03-07 NOTE — Telephone Encounter (Signed)
I spoke to the patient about the CREAD study. Patient is not interested in participating. I thanked her for her time.

## 2015-04-22 DIAGNOSIS — M25561 Pain in right knee: Secondary | ICD-10-CM | POA: Diagnosis not present

## 2015-04-22 DIAGNOSIS — Z6824 Body mass index (BMI) 24.0-24.9, adult: Secondary | ICD-10-CM | POA: Diagnosis not present

## 2015-04-22 DIAGNOSIS — Z1389 Encounter for screening for other disorder: Secondary | ICD-10-CM | POA: Diagnosis not present

## 2015-04-22 DIAGNOSIS — M179 Osteoarthritis of knee, unspecified: Secondary | ICD-10-CM | POA: Diagnosis not present

## 2015-04-29 ENCOUNTER — Other Ambulatory Visit: Payer: Self-pay

## 2015-04-29 DIAGNOSIS — Z1231 Encounter for screening mammogram for malignant neoplasm of breast: Secondary | ICD-10-CM

## 2015-05-25 DIAGNOSIS — M25561 Pain in right knee: Secondary | ICD-10-CM | POA: Diagnosis not present

## 2015-05-25 DIAGNOSIS — M25562 Pain in left knee: Secondary | ICD-10-CM | POA: Diagnosis not present

## 2015-05-30 ENCOUNTER — Ambulatory Visit: Payer: Medicare Other | Admitting: Neurology

## 2015-05-31 ENCOUNTER — Ambulatory Visit
Admission: RE | Admit: 2015-05-31 | Discharge: 2015-05-31 | Disposition: A | Discharge status: Home / Self Care | Payer: Medicare Other | Source: Ambulatory Visit

## 2015-05-31 DIAGNOSIS — Z1231 Encounter for screening mammogram for malignant neoplasm of breast: Secondary | ICD-10-CM | POA: Diagnosis not present

## 2015-06-02 DIAGNOSIS — G5601 Carpal tunnel syndrome, right upper limb: Secondary | ICD-10-CM | POA: Diagnosis not present

## 2015-06-15 DIAGNOSIS — M1712 Unilateral primary osteoarthritis, left knee: Secondary | ICD-10-CM | POA: Diagnosis not present

## 2015-06-15 DIAGNOSIS — M1711 Unilateral primary osteoarthritis, right knee: Secondary | ICD-10-CM | POA: Diagnosis not present

## 2015-06-22 DIAGNOSIS — F419 Anxiety disorder, unspecified: Secondary | ICD-10-CM | POA: Diagnosis not present

## 2015-06-22 DIAGNOSIS — G4700 Insomnia, unspecified: Secondary | ICD-10-CM | POA: Diagnosis not present

## 2015-06-22 DIAGNOSIS — M179 Osteoarthritis of knee, unspecified: Secondary | ICD-10-CM | POA: Diagnosis not present

## 2015-06-22 DIAGNOSIS — Z6824 Body mass index (BMI) 24.0-24.9, adult: Secondary | ICD-10-CM | POA: Diagnosis not present

## 2015-06-22 DIAGNOSIS — R413 Other amnesia: Secondary | ICD-10-CM | POA: Diagnosis not present

## 2015-06-22 DIAGNOSIS — Z23 Encounter for immunization: Secondary | ICD-10-CM | POA: Diagnosis not present

## 2015-07-13 ENCOUNTER — Ambulatory Visit

## 2015-09-01 DIAGNOSIS — C44319 Basal cell carcinoma of skin of other parts of face: Secondary | ICD-10-CM | POA: Diagnosis not present

## 2015-09-21 DIAGNOSIS — C44319 Basal cell carcinoma of skin of other parts of face: Secondary | ICD-10-CM | POA: Diagnosis not present

## 2015-10-27 ENCOUNTER — Ambulatory Visit: Payer: Medicare Other | Admitting: Neurology

## 2015-12-22 DIAGNOSIS — R930 Abnormal findings on diagnostic imaging of skull and head, not elsewhere classified: Secondary | ICD-10-CM | POA: Diagnosis not present

## 2015-12-22 DIAGNOSIS — G4700 Insomnia, unspecified: Secondary | ICD-10-CM | POA: Diagnosis not present

## 2015-12-22 DIAGNOSIS — Z6823 Body mass index (BMI) 23.0-23.9, adult: Secondary | ICD-10-CM | POA: Diagnosis not present

## 2015-12-22 DIAGNOSIS — Z79899 Other long term (current) drug therapy: Secondary | ICD-10-CM | POA: Diagnosis not present

## 2015-12-22 DIAGNOSIS — Z0000 Encounter for general adult medical examination without abnormal findings: Secondary | ICD-10-CM | POA: Diagnosis not present

## 2015-12-22 DIAGNOSIS — Z9181 History of falling: Secondary | ICD-10-CM | POA: Diagnosis not present

## 2015-12-22 DIAGNOSIS — F419 Anxiety disorder, unspecified: Secondary | ICD-10-CM | POA: Diagnosis not present

## 2015-12-22 DIAGNOSIS — R413 Other amnesia: Secondary | ICD-10-CM | POA: Diagnosis not present

## 2015-12-22 DIAGNOSIS — E7800 Pure hypercholesterolemia, unspecified: Secondary | ICD-10-CM | POA: Diagnosis not present

## 2015-12-22 DIAGNOSIS — M8580 Other specified disorders of bone density and structure, unspecified site: Secondary | ICD-10-CM | POA: Diagnosis not present

## 2015-12-22 DIAGNOSIS — Z23 Encounter for immunization: Secondary | ICD-10-CM | POA: Diagnosis not present

## 2015-12-22 DIAGNOSIS — M179 Osteoarthritis of knee, unspecified: Secondary | ICD-10-CM | POA: Diagnosis not present

## 2016-02-27 DIAGNOSIS — M1712 Unilateral primary osteoarthritis, left knee: Secondary | ICD-10-CM | POA: Diagnosis not present

## 2016-02-27 DIAGNOSIS — M1711 Unilateral primary osteoarthritis, right knee: Secondary | ICD-10-CM | POA: Diagnosis not present

## 2016-03-29 DIAGNOSIS — G309 Alzheimer's disease, unspecified: Secondary | ICD-10-CM | POA: Diagnosis not present

## 2016-03-29 DIAGNOSIS — E2839 Other primary ovarian failure: Secondary | ICD-10-CM | POA: Diagnosis not present

## 2016-03-29 DIAGNOSIS — F418 Other specified anxiety disorders: Secondary | ICD-10-CM | POA: Diagnosis not present

## 2016-03-29 DIAGNOSIS — Z6822 Body mass index (BMI) 22.0-22.9, adult: Secondary | ICD-10-CM | POA: Diagnosis not present

## 2016-04-05 DIAGNOSIS — E2839 Other primary ovarian failure: Secondary | ICD-10-CM | POA: Diagnosis not present

## 2016-04-06 ENCOUNTER — Other Ambulatory Visit: Payer: Self-pay | Admitting: Physician Assistant

## 2016-04-06 DIAGNOSIS — Z1231 Encounter for screening mammogram for malignant neoplasm of breast: Secondary | ICD-10-CM

## 2016-04-30 ENCOUNTER — Ambulatory Visit (INDEPENDENT_AMBULATORY_CARE_PROVIDER_SITE_OTHER): Payer: Medicare Other | Admitting: Orthopaedic Surgery

## 2016-05-21 DIAGNOSIS — C44319 Basal cell carcinoma of skin of other parts of face: Secondary | ICD-10-CM | POA: Diagnosis not present

## 2016-05-21 DIAGNOSIS — L570 Actinic keratosis: Secondary | ICD-10-CM | POA: Diagnosis not present

## 2016-06-05 ENCOUNTER — Ambulatory Visit
Admission: RE | Admit: 2016-06-05 | Discharge: 2016-06-05 | Disposition: A | Discharge status: Home / Self Care | Payer: Medicare Other | Source: Ambulatory Visit | Attending: Physician Assistant | Admitting: Physician Assistant

## 2016-06-05 DIAGNOSIS — Z1231 Encounter for screening mammogram for malignant neoplasm of breast: Secondary | ICD-10-CM

## 2016-07-04 DIAGNOSIS — C44319 Basal cell carcinoma of skin of other parts of face: Secondary | ICD-10-CM | POA: Diagnosis not present

## 2016-07-05 DIAGNOSIS — F419 Anxiety disorder, unspecified: Secondary | ICD-10-CM | POA: Diagnosis not present

## 2016-07-05 DIAGNOSIS — M8580 Other specified disorders of bone density and structure, unspecified site: Secondary | ICD-10-CM | POA: Diagnosis not present

## 2016-07-05 DIAGNOSIS — Z79899 Other long term (current) drug therapy: Secondary | ICD-10-CM | POA: Diagnosis not present

## 2016-07-05 DIAGNOSIS — Z23 Encounter for immunization: Secondary | ICD-10-CM | POA: Diagnosis not present

## 2016-07-05 DIAGNOSIS — M179 Osteoarthritis of knee, unspecified: Secondary | ICD-10-CM | POA: Diagnosis not present

## 2016-07-05 DIAGNOSIS — R930 Abnormal findings on diagnostic imaging of skull and head, not elsewhere classified: Secondary | ICD-10-CM | POA: Diagnosis not present

## 2016-07-05 DIAGNOSIS — R413 Other amnesia: Secondary | ICD-10-CM | POA: Diagnosis not present

## 2016-08-13 DIAGNOSIS — R748 Abnormal levels of other serum enzymes: Secondary | ICD-10-CM | POA: Diagnosis not present

## 2016-08-13 DIAGNOSIS — D751 Secondary polycythemia: Secondary | ICD-10-CM | POA: Diagnosis not present

## 2016-12-01 DIAGNOSIS — L820 Inflamed seborrheic keratosis: Secondary | ICD-10-CM | POA: Diagnosis not present

## 2017-03-08 DIAGNOSIS — Z23 Encounter for immunization: Secondary | ICD-10-CM | POA: Diagnosis not present

## 2017-03-08 DIAGNOSIS — Z0000 Encounter for general adult medical examination without abnormal findings: Secondary | ICD-10-CM | POA: Diagnosis not present

## 2017-03-08 DIAGNOSIS — Z1231 Encounter for screening mammogram for malignant neoplasm of breast: Secondary | ICD-10-CM | POA: Diagnosis not present

## 2017-03-08 DIAGNOSIS — Z1389 Encounter for screening for other disorder: Secondary | ICD-10-CM | POA: Diagnosis not present

## 2017-03-08 DIAGNOSIS — Z9181 History of falling: Secondary | ICD-10-CM | POA: Diagnosis not present

## 2017-03-08 DIAGNOSIS — R413 Other amnesia: Secondary | ICD-10-CM | POA: Diagnosis not present

## 2017-03-13 ENCOUNTER — Other Ambulatory Visit: Payer: Self-pay | Admitting: Physician Assistant

## 2017-03-13 DIAGNOSIS — Z1231 Encounter for screening mammogram for malignant neoplasm of breast: Secondary | ICD-10-CM

## 2017-03-21 DIAGNOSIS — M8580 Other specified disorders of bone density and structure, unspecified site: Secondary | ICD-10-CM | POA: Diagnosis not present

## 2017-03-21 DIAGNOSIS — M179 Osteoarthritis of knee, unspecified: Secondary | ICD-10-CM | POA: Diagnosis not present

## 2017-03-21 DIAGNOSIS — Z79899 Other long term (current) drug therapy: Secondary | ICD-10-CM | POA: Diagnosis not present

## 2017-03-21 DIAGNOSIS — G4700 Insomnia, unspecified: Secondary | ICD-10-CM | POA: Diagnosis not present

## 2017-03-21 DIAGNOSIS — F0390 Unspecified dementia without behavioral disturbance: Secondary | ICD-10-CM | POA: Diagnosis not present

## 2017-03-21 DIAGNOSIS — R748 Abnormal levels of other serum enzymes: Secondary | ICD-10-CM | POA: Diagnosis not present

## 2017-04-26 DIAGNOSIS — R748 Abnormal levels of other serum enzymes: Secondary | ICD-10-CM | POA: Diagnosis not present

## 2017-05-20 DIAGNOSIS — F419 Anxiety disorder, unspecified: Secondary | ICD-10-CM | POA: Diagnosis not present

## 2017-05-20 DIAGNOSIS — F0390 Unspecified dementia without behavioral disturbance: Secondary | ICD-10-CM | POA: Diagnosis not present

## 2017-05-20 DIAGNOSIS — R55 Syncope and collapse: Secondary | ICD-10-CM | POA: Diagnosis not present

## 2017-05-27 ENCOUNTER — Ambulatory Visit (INDEPENDENT_AMBULATORY_CARE_PROVIDER_SITE_OTHER): Payer: Medicare Other | Admitting: Neurology

## 2017-05-27 ENCOUNTER — Encounter: Payer: Self-pay | Admitting: Neurology

## 2017-05-27 VITALS — BP 139/65 | HR 72 | Ht 59.0 in | Wt 123.0 lb

## 2017-05-27 DIAGNOSIS — R55 Syncope and collapse: Secondary | ICD-10-CM

## 2017-05-27 NOTE — Patient Instructions (Addendum)
Vasovagal syncope is one of the most common causes of fainting. Vasovagal syncope occurs when your body overreacts to certain triggers, such as the sight of blood or extreme emotional distress or overheating.  The vasovagal syncope trigger causes a sudden drop in your heart rate and blood pressure, which leads to reduced blood flow to your brain, which results in a brief loss of consciousness. Vasovagal syncope is usually harmless and requires no treatment. However, if you collapse and fall, it is possible you may injure yourself.  Pre-sycopal symptoms include (but are not limited to): Skin paleness, lightheadedness, tunnel vision, nausea, a rising feeling of warmth, feeling cold and clammy, excess yawning, and blurry vision. You may have jerky, abnormal movements, a slow and weak pulse and dilated pupils and you may have some confusion and mental slowing when you come to.  Common triggers for vasovagal syncope include (but are not limited to): Standing for long periods of time, heat exposure, the sight of blood or having blood drawn, fear and straining, such as during a bowel movement.  There is no specific medication for treatment of fainting. Sometimes we use medications to keep the blood pressure elevated but this is rare. Supportive treatment includes foot exercises, wearing compression stockings or tensing your leg muscles when standing, increasing salt in your diet (unless you have high blood pressure). Avoid prolonged standing - especially in hot, crowded places - and drink plenty of fluids and change position from sitting to standing or lying to standing slowly and avoid overheating.    Your exam looks good from the neurological standpoint. We will do additional testing:  We will do a brain scan, called MRI and call you with the test results. We will have to schedule you for this on a separate date. This test requires authorization from your insurance, and we will take care of the insurance  process.  We will do an EEG (brainwave test), which we will schedule. We will call you with the results.  So long as your test results are stable or age-appropriate, I will see you back as needed.   If you have problems with feeling faint or lightheaded, talk to Cyndi Bender about seeing a cardiologist.

## 2017-05-27 NOTE — Progress Notes (Signed)
Subjective:    Patient ID: Carly Larson is a 66 y.o. female.  HPI     Interim history:   Carly Larson is a 66 year old right-handed woman with an underlying medical history of reflux disease, carpal tunnel syndrome, osteopenia, anxiety, and vitamin D deficiency, and memory loss, who was referred for a new problem of near-syncope. The patient is accompanied by her husband today. I last saw her on 11/23/2014, at which time she reported her memory was worse. Her husband had noted a decline in her memory as well, she was misplacing things and was more forgetful. She was on Aricept once daily and Namenda 10 mg twice daily. This was started per primary care physician. 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 Of note, she did not return for follow-up, canceled an appointment on 05/30/2015 and no showed for an appointment on 10/27/2015.  Today, 05/27/17: She reports feeling at baseline. Her husband reports that she had an episode of passing out which lasted less than a minute. This happened about 2 weeks ago. He did not notice any twitching or convulsion. She does not have recollection of the events. He noted that she became pale and sweaty. They were at church, cooking. She remembers feeling really hot. She tries to hydrate well typically. She denies any chest pain or shortness of breath. She had no prodromal illness. She did not lose bowel or bladder control or bit her tongue. Her husband noticed no foaming at the mouth. After a couple minutes she was back to baseline. She has not had any episode of severe headache one-sided weakness or numbness or tingling or slurring of speech or droopy face.  Previously:   The patient's allergies, current medications, family history, past medical history, past social history, past surgical history and problem list were reviewed and updated as appropriate.   I first met her on 12/03/2013 at the request of her primary care provider, at which time she reported a 2  year history of memory loss. She had been on Aricept 5 mg since March 2015. Her MMSE was 28 out of 30 at the time. I encouraged her to increase Aricept to 10 mg. We talked about sending her for formal neuropsychological evaluation but she chose to hold off and wanted to discuss the increase in her Aricept with her primary care provider first as well. I suggested an as needed follow-up. Of note, the patient did not show for an appointment on 10/26/2014.    She has had memory loss for over 2 years. She has been on Aricept 5 mg since March 2015. According to your office note from 11/11/2013 her MMSE in May 2013 was 24/30 and in March 2015 was 30 out of 30 but subjectively she felt that her memory was getting worse, which is why you started her on low-dose Aricept. After this she felt stabilized or perhaps a little improved. She had a brain MRI with and without contrast on 10/02/2013: The cerebellar tonsils extend 5 mm below the foramen magnum. Below there some around, the posterior fossa appear small. This suggests a mild Chiari 1 malformation. 2. Mild periventricular and subcortical white matter changes slightly advanced for age. The finding is nonspecific but can be seen in the setting of chronic microvascular ischemia, a demyelinating process such as multiple sclerosis, vasculitis, complicated migraine headaches, or as the sequelae of a prior infectious or inflammatory process.  In addition, I personally reviewed the images through the PACS system.  I reviewed her labs which included normal CMP, normal TSH, normal CBC, vitamin B12 of 1185, ESR of 2, normal vitamin D.    She reports recurrent headaches. She describes a pounding HA in the top and front of her head, associated with some nausea and vomiting, and she had a total of 3 severe HAs since mid-April. She had spots in her vision. She has no photophobia or sonophobia.   She has trouble going to sleep and takes an OTC sleep aid each night. She endorses  stress. She works fulltime as a Psychiatric nurse. She describes thoughts racing. She has been on Effexor 75 mg 1 in AM and 2 at night.   There is no report of Auditory Hallucinations and Visual Hallucinations and there are no delusions, such as paranoia.   She has not been on any dementia medications. She has no symptoms of depression and denies suicidal ideations or homicidal ideations.   The patient denies prior TIA or stroke symptoms, such as sudden onset of one sided weakness, numbness, tingling, slurring of speech or droopy face, hearing loss, tinnitus, diplopia or visual field cut or monocular loss of vision.  Of note, the patient is reported to snore mildly but there is no report of witnessed apneas or choking sensations while asleep.   Her mother has Alzheimer's disease and is 19 years old. Her father passed away. He had a stroke. She has one daughter. She has been driving and has had no problems driving such as getting lost or misjudging. Her husband endorses that she drives well.  Her Past Medical History Is Significant For: Past Medical History:  Diagnosis Date  . Anxiety state, unspecified   . Carpal tunnel syndrome   . Carpal tunnel syndrome, bilateral   . Chiari malformation type I ([place])   . Disorder of bone and cartilage, unspecified   . Encounter for long-term (current) use of other medications   . Esophageal reflux   . Headache   . High risk medication use   . Insomnia   . Memory loss    MMSE 24/30 on 12/05/11, 30/30 on 10/25/13  . Memory loss   . Menopausal symptoms   . Osteopenia    Hx of vitamin D deficiency  . Osteopenia   . Symptomatic menopausal or female climacteric states   . White matter abnormality on MRI of brain     Her Past Surgical History Is Significant For: Past Surgical History:  Procedure Laterality Date  . APPENDECTOMY      Her Family History Is Significant For: Family History  Problem Relation Age of Onset  . Breast cancer Maternal Aunt   . Diabetes  Mother   . Osteoarthritis Mother   . Hypertension Mother   . Dementia Mother   . Stroke Father   . Heart attack Brother   . CAD Brother     Her Social History Is Significant For: Social History   Socioeconomic History  . Marital status: Married    Spouse name: Jeani Hawking   . Number of children: 1  . Years of education: 12+  . Highest education level: None  Social Needs  . Financial resource strain: None  . Food insecurity - worry: None  . Food insecurity - inability: None  . Transportation needs - medical: None  . Transportation needs - non-medical: None  Occupational History  . Occupation: Teacher, music: [employer]  Tobacco Use  . Smoking status: Never Smoker  . Smokeless tobacco: Never  Used  Substance and Sexual Activity  . Alcohol use: No    Alcohol/week: 0.0 oz  . Drug use: No  . Sexual activity: None  Other Topics Concern  . None  Social History Narrative   Patient lives at home with husband. Jeani Hawking   Patient has 1 child.    Patient is left handed.    Patient is currently working    Patient has a college education    3 cups of coffee a day     Her Allergies Are:  No Known Allergies:   Her Current Medications Are:  Outpatient Encounter Medications as of 05/27/2017  Medication Sig  . Cholecalciferol (VITAMIN D-3) 1000 UNITS CAPS Take 1 capsule by mouth 2 (two) times daily.  Marland Kitchen donepezil (ARICEPT) 5 MG tablet Take 5 mg by mouth at bedtime.  . meloxicam (MOBIC) 15 MG tablet Take 15 mg by mouth daily.  . memantine (NAMENDA) 10 MG tablet Take 10 mg by mouth 2 (two) times daily.  . Multiple Vitamin (MULTIVITAMIN) capsule Take 1 capsule by mouth daily.  . temazepam (RESTORIL) 30 MG capsule Take 30 mg by mouth at bedtime as needed for sleep.  . [DISCONTINUED] acyclovir ointment (ZOVIRAX) 5 % Apply 1 application topically every 3 (three) hours.  . [DISCONTINUED] alendronate (FOSAMAX) 70 MG tablet Take 70 mg by mouth once a week. Take with a full glass  of water on an empty stomach.  . [DISCONTINUED] Apoaequorin 10 MG CAPS Take 10 mg by mouth.  . [DISCONTINUED] aspirin 81 MG tablet Take 81 mg by mouth daily.  . [DISCONTINUED] B Complex Vitamins (VITAMIN B-COMPLEX PO) Take 1 tablet by mouth daily.  . [DISCONTINUED] omeprazole (PRILOSEC) 20 MG capsule Take 20 mg by mouth daily.  . [DISCONTINUED] POTASSIUM PO Take 1 tablet by mouth 2 (two) times daily.  . [DISCONTINUED] traMADol (ULTRAM) 50 MG tablet Take by mouth 2 (two) times daily.  . [DISCONTINUED] venlafaxine (EFFEXOR) 75 MG tablet Take 75 mg by mouth 3 (three) times daily with meals.   No facility-administered encounter medications on file as of 05/27/2017.   :  Review of Systems:  Out of a complete 14 point review of systems, all are reviewed and negative with the exception of these symptoms as listed below:  Review of Systems  Neurological:       Patient's husband reports that about 2 weeks ago, patient got really pale, started sweating, and passed out for about 25-30 seconds. He denies any seizure activity. Patient states that she doesn't remember it happening.     Objective:  Neurological Exam  Physical Exam Physical Examination:   Vitals:   05/27/17 1429  BP: 139/65  Pulse: 72    General Examination: The patient is a very pleasant 67 y.o. female in no acute distress. She appears well-developed and well-nourished and well groomed.   HEENT: Normocephalic, atraumatic, pupils are equal, round and reactive to light and accommodation. Extraocular tracking shows no saccadic breakdown without nystagmus noted. Hearing is intact. Face is symmetric with no facial masking and normal facial sensation. There is no lip, neck or jaw tremor. Neck is not rigid with intact passive ROM. There are no carotid bruits on auscultation. Oropharynx exam reveals mild mouth dryness. No significant airway crowding is noted. Mallampati is class I. Tongue protrudes centrally and palate elevates  symmetrically.    Chest: is clear to auscultation without wheezing, rhonchi or crackles noted.  Heart: sounds are regular and normal without murmurs, rubs or gallops noted.  Abdomen: is soft, non-tender and non-distended with normal bowel sounds appreciated on auscultation.  Extremities: There is no pitting edema in the distal lower extremities bilaterally. Pedal pulses are intact.   Skin: is warm and dry with no trophic changes noted. Age-related changes are noted on the skin.   Musculoskeletal: exam reveals no obvious joint deformities, tenderness or joint swelling or erythema.   Neurologically:  Mental status: The patient is awake and alert, paying good  attention. She is able to provide the history. Her husband provides details from his end. She is oriented to: person, place, time/date, situation, day of week, month of year and year. Her memory, attention, language and knowledge are fair. There is no aphasia, agnosia, apraxia or anomia. There is a no significant degree of bradyphrenia. Speech is not hypophonic with no dysarthria noted. Mood is congruent and affect is normal.   (In 5/15: Her MMSE (Mini-Mental state exam) score is 28/30. AFT was 15/min.)    (On 11/23/2014: MMSE: 24/30, CDT: 4/4, AFT: 14/min.)  Cranial nerves are as described above under HEENT exam. In addition, shoulder shrug is normal with equal shoulder height noted.  Motor exam: Normal bulk, and strength for age is noted. Tone is not rigid with absence of cogwheeling in the bilateral extremities. There is overall no bradykinesia. There is no drift or rebound. There is no tremor.   Romberg is negative. Reflexes are 2+ in the upper extremities and lower extremities. Toes are downgoing bilaterally. Fine motor skills: Finger taps, hand movements, and rapid alternating patting are not impaired bilaterally. Foot taps and foot agility are not impaired bilaterally.   Cerebellar testing shows no dysmetria or  intention tremor on finger to nose testing. Heel to shin is unremarkable. There is no truncal or gait ataxia.   Sensory exam is intact to light touch in the upper and lower extremities.   Gait, station and balance: She stands up from the seated position with no difficulty. No veering to one side is noted. No leaning to one side. Posture is age-appropriate, not stooped. Stance is narrow-based. Tandem walk is good for age.  Assessment and Plan:   In summary, Victoriah R Clune is a very pleasant 66 year old female with an underlying medical history of reflux disease, carpal tunnel syndrome, osteopenia, anxiety, memory loss, and vitamin D deficiency, who presents for evaluation after a syncopal spell. She most likely had a vasovagal syncope. Neurological exam is nonfocal which is reassuring, history also not suggestive of a seizure events or TIA. Nevertheless, I suggested further workup from my end of things in the form of EEG and brain MRI. I suggested she continue with her memory medication and follow-up with primary care provider. If she has any recurrence of symptoms of lightheadedness or faint feeling, she is advised to talk to Mr. Tobie Lords about seeing a cardiologist next. We will keep them posted as to her test results. So long as her MRI is age-appropriate/stable/nonfocal and EEG is within normal range for age, she can follow-up with me on an as-needed basis. I did encourage patient to pursue healthy lifestyle including regular exercise, good hydration, good nutrition. I answered all their questions today and the patient and her husband were in agreement  We discussed her prior MRI findings from March 2015 again today.   Star Age, MD, PhD [facility])

## 2017-06-07 ENCOUNTER — Ambulatory Visit: Payer: Medicare Other

## 2017-06-12 ENCOUNTER — Ambulatory Visit
Admission: RE | Admit: 2017-06-12 | Discharge: 2017-06-12 | Disposition: A | Discharge status: Home / Self Care | Payer: Medicare Other | Source: Ambulatory Visit | Attending: Physician Assistant | Admitting: Physician Assistant

## 2017-06-12 DIAGNOSIS — Z1231 Encounter for screening mammogram for malignant neoplasm of breast: Secondary | ICD-10-CM | POA: Diagnosis not present

## 2017-06-17 ENCOUNTER — Other Ambulatory Visit: Payer: Medicare Other

## 2017-06-18 ENCOUNTER — Encounter: Payer: Self-pay | Admitting: Neurology

## 2017-06-19 ENCOUNTER — Ambulatory Visit
Admission: RE | Admit: 2017-06-19 | Discharge: 2017-06-19 | Disposition: A | Discharge status: Home / Self Care | Payer: Medicare Other | Source: Ambulatory Visit | Attending: Neurology | Admitting: Neurology

## 2017-06-19 DIAGNOSIS — R55 Syncope and collapse: Secondary | ICD-10-CM | POA: Diagnosis not present

## 2017-06-20 NOTE — Progress Notes (Signed)
Please call patient or her husband, her brain MRI without contrast from 06/19/2017 shows age-appropriate findings, no acute findings. I had ordered an EEG which does not appear to be scheduled yet. Please inquire about EEG appt.  Star Age, MD, PhD [facility])

## 2017-06-24 ENCOUNTER — Telehealth: Payer: Self-pay

## 2017-06-24 NOTE — Telephone Encounter (Signed)
-----   Message from Star Age, MD sent at 06/20/2017  5:20 PM EST ----- Please call patient or her husband, her brain MRI without contrast from 06/19/2017 shows age-appropriate findings, no acute findings. I had ordered an EEG which does not appear to be scheduled yet. Please inquire about EEG appt.  Star Age, MD, PhD [facility])

## 2017-06-24 NOTE — Telephone Encounter (Signed)
Called pt.'s spouse and scheduled EEG. JBA

## 2017-06-24 NOTE — Telephone Encounter (Signed)
I called pt. I advised her that her MRI brain showed age appropriate findings, nothing acute. Pt no showed for her EEG appt on 06/17/17. Pt is asking for this to be rescheduled. Will send to our check out staff to ask if pt can be rescheduled. Pt verbalized understanding of results. Pt had no questions at this time but was encouraged to call back if questions arise.

## 2017-07-04 ENCOUNTER — Ambulatory Visit (INDEPENDENT_AMBULATORY_CARE_PROVIDER_SITE_OTHER): Payer: Medicare Other

## 2017-07-04 DIAGNOSIS — R55 Syncope and collapse: Secondary | ICD-10-CM

## 2017-07-04 NOTE — Procedures (Signed)
    History:  Carly Larson is a 66 year old patient with a history of some memory loss and recent onset of near syncopal events.  The patient has had episodes of becoming pale and sweaty possibly with brief loss of consciousness without jerking.  The patient is being evaluated for these events.  This is a routine EEG.  No skull defects are noted.  Medications include vitamin D, Aricept, Mobic, Namenda, multivitamins, and Restoril.  EEG classification: Normal awake  Description of the recording: The background rhythms of this recording consists of a fairly well modulated medium amplitude alpha rhythm of 9 Hz that is reactive to eye opening and closure. As the record progresses, the patient appears to remain in the waking state throughout the recording. Photic stimulation was performed, resulting in a bilateral and symmetric photic driving response. Hyperventilation was also performed, resulting in a minimal buildup of the background rhythm activities without significant slowing seen. At no time during the recording does there appear to be evidence of spike or spike wave discharges or evidence of focal slowing. EKG monitor shows no evidence of cardiac rhythm abnormalities with a heart rate of 60.  Impression: This is a normal EEG recording in the waking state. No evidence of ictal or interictal discharges are seen.

## 2017-07-05 ENCOUNTER — Telehealth: Payer: Self-pay

## 2017-07-05 NOTE — Telephone Encounter (Signed)
I called pt to discuss her EEG results. No answer, no VM set up, will try again later.

## 2017-07-05 NOTE — Progress Notes (Signed)
Please call and advise the patient that the EEG or brain wave test we performed was reported as normal in the awake state. We checked for abnormal electrical discharges in the brain waves and the report suggested normal findings. No further action is required on this test at this time. Please remind patient to keep any upcoming appointments or tests and to call us with any interim questions, concerns, problems or updates. Thanks,  Annalis Kaczmarczyk, MD, PhD  

## 2017-07-05 NOTE — Telephone Encounter (Signed)
-----   Message from Star Age, MD sent at 07/05/2017  8:09 AM EST ----- Please call and advise the patient that the EEG or brain wave test we performed was reported as normal in the awake state. We checked for abnormal electrical discharges in the brain waves and the report suggested normal findings. No further action is required on this test at this time. Please remind patient to keep any upcoming appointments or tests and to call us with any interim questions, concerns, problems or updates. Thanks,  Star Age, MD, PhD

## 2017-07-09 NOTE — Telephone Encounter (Signed)
I called pt. I advised her that her EEG was reported as normal. Pt verbalized understanding of results. Pt had no questions at this time but was encouraged to call back if questions arise.

## 2017-07-24 DIAGNOSIS — M7062 Trochanteric bursitis, left hip: Secondary | ICD-10-CM | POA: Diagnosis not present

## 2017-07-24 DIAGNOSIS — Z6825 Body mass index (BMI) 25.0-25.9, adult: Secondary | ICD-10-CM | POA: Diagnosis not present

## 2017-08-05 DIAGNOSIS — Z6824 Body mass index (BMI) 24.0-24.9, adult: Secondary | ICD-10-CM | POA: Diagnosis not present

## 2017-08-05 DIAGNOSIS — N390 Urinary tract infection, site not specified: Secondary | ICD-10-CM | POA: Diagnosis not present

## 2017-08-05 DIAGNOSIS — F0390 Unspecified dementia without behavioral disturbance: Secondary | ICD-10-CM | POA: Diagnosis not present

## 2017-08-05 DIAGNOSIS — M25552 Pain in left hip: Secondary | ICD-10-CM | POA: Diagnosis not present

## 2017-08-08 ENCOUNTER — Ambulatory Visit (INDEPENDENT_AMBULATORY_CARE_PROVIDER_SITE_OTHER): Payer: Medicare Other | Admitting: Orthopaedic Surgery

## 2017-08-08 ENCOUNTER — Encounter (INDEPENDENT_AMBULATORY_CARE_PROVIDER_SITE_OTHER): Payer: Self-pay | Admitting: Orthopaedic Surgery

## 2017-08-08 ENCOUNTER — Other Ambulatory Visit (INDEPENDENT_AMBULATORY_CARE_PROVIDER_SITE_OTHER): Payer: Self-pay

## 2017-08-08 ENCOUNTER — Ambulatory Visit (INDEPENDENT_AMBULATORY_CARE_PROVIDER_SITE_OTHER): Payer: Medicare Other

## 2017-08-08 DIAGNOSIS — M5432 Sciatica, left side: Secondary | ICD-10-CM | POA: Insufficient documentation

## 2017-08-08 DIAGNOSIS — M25561 Pain in right knee: Secondary | ICD-10-CM | POA: Diagnosis not present

## 2017-08-08 DIAGNOSIS — G8929 Other chronic pain: Secondary | ICD-10-CM | POA: Diagnosis not present

## 2017-08-08 DIAGNOSIS — M25562 Pain in left knee: Secondary | ICD-10-CM

## 2017-08-08 DIAGNOSIS — M4807 Spinal stenosis, lumbosacral region: Secondary | ICD-10-CM

## 2017-08-08 MED ORDER — METHYLPREDNISOLONE ACETATE 40 MG/ML IJ SUSP
40.0000 mg | INTRAMUSCULAR | Status: AC | PRN
  Administered 2017-08-08: 40 mg via INTRA_ARTICULAR

## 2017-08-08 MED ORDER — LIDOCAINE HCL 1 % IJ SOLN
3.0000 mL | INTRAMUSCULAR | Status: AC | PRN
Start: 2017-08-08 — End: 2017-08-08
  Administered 2017-08-08: 3 mL

## 2017-08-08 MED ORDER — METHOCARBAMOL 500 MG PO TABS: 500.0000 mg | ORAL_TABLET | Freq: Four times a day (QID) | ORAL | 0 refills | Status: DC | PRN

## 2017-08-08 MED ORDER — GABAPENTIN 300 MG PO CAPS: 300.0000 mg | ORAL_CAPSULE | Freq: Two times a day (BID) | ORAL | 0 refills | Status: DC

## 2017-08-08 MED ORDER — LIDOCAINE HCL 1 % IJ SOLN
3.0000 mL | INTRAMUSCULAR | Status: AC | PRN
  Administered 2017-08-08: 3 mL

## 2017-08-08 NOTE — Progress Notes (Signed)
Office Visit Note   Patient: Carly Larson           Date of Birth: [birth year]           MRN: [id number] Visit Date: 08/08/2017              Requested by: Cyndi Bender, PA-C [address] [place], [place] [address] PCP: Cyndi Bender, PA-C   Assessment & Plan: Visit Diagnoses:  1. Sciatica, left side   2. Chronic pain of right knee   3. Chronic pain of left knee     Plan: At this point we would like to obtain an MRI of her lumbar spine to rule out herniated disc given the anterolisthesis and given her continued sciatic symptoms that are worsening in spite of conservative treatment including a steroid taper.  Also provide a steroid injections in both her knees today and she is appropriate candidate for hyaluronic acid in both knees.  We will see her back in 2 weeks ago for the MRI.  And will start on Neurontin as well as Robaxin to take as needed.  In 2 weeks hopefully will place a hyaluronic acid injections in both knees and I would like an AP and lateral both knees at that visit as well.  All questions concerns were answered and addressed.  Follow-Up Instructions: Return in about 2 weeks (around 08/22/2017).   Orders:  Orders Placed This Encounter  Procedures  . Large Joint Inj  . Large Joint Inj  . XR HIP UNILAT W OR W/O PELVIS 1V LEFT  . XR Lumbar Spine 2-3 Views   Meds ordered this encounter  Medications  . gabapentin (NEURONTIN) 300 MG capsule    Sig: Take 1 capsule (300 mg total) by mouth 2 (two) times daily.    Dispense:  60 capsule    Refill:  0  . methocarbamol (ROBAXIN) 500 MG tablet    Sig: Take 1 tablet (500 mg total) by mouth every 6 (six) hours as needed for muscle spasms.    Dispense:  60 tablet    Refill:  0      Procedures: Large Joint Inj: R knee on 08/08/2017 1:22 PM Indications: diagnostic evaluation and pain Details: 22 G 1.5 in needle, superolateral approach  Arthrogram: No  Medications: 3 mL lidocaine 1 %; 40 mg methylPREDNISolone acetate 40  MG/ML Outcome: tolerated well, no immediate complications Procedure, treatment alternatives, risks and benefits explained, specific risks discussed. Consent was given by the patient. Immediately prior to procedure a time out was called to verify the correct patient, procedure, equipment, support staff and site/side marked as required. Patient was prepped and draped in the usual sterile fashion.   Large Joint Inj: L knee on 08/08/2017 1:22 PM Indications: diagnostic evaluation and pain Details: 22 G 1.5 in needle, superolateral approach  Arthrogram: No  Medications: 3 mL lidocaine 1 %; 40 mg methylPREDNISolone acetate 40 MG/ML Outcome: tolerated well, no immediate complications Procedure, treatment alternatives, risks and benefits explained, specific risks discussed. Consent was given by the patient. Immediately prior to procedure a time out was called to verify the correct patient, procedure, equipment, support staff and site/side marked as required. Patient was prepped and draped in the usual sterile fashion.       Clinical Data: No additional findings.   Subjective: Chief Complaint  Patient presents with  . Left Hip - Pain  . Lower Back - Pain  The patient is a 67 year old that we have not seen a long  period of time.  She comes with a chief complaint of bilateral knee pain and low back pain with sciatica.  She is been treated for bursitis as well as her hip and is unclear what the biggest etiology of her pain is but when she stands she points to her backside on the left side and sciatic region as a source of her pain.  She is not a diabetic but does get occasional numbness and tingling pains in her foot as well who said both knees hurt quite a bit.  She had a steroid taper recently on meloxicam and from her primary care physician said that has not helped injections in her knees about a year ago she states that not help either.  She denies any locking catching but says it just aches all  the time.  She denies any trauma.  Her husband is with her today.  She is not a diabetic.  HPI  Review of Systems She denies any headache, chest pain, shortness of breath, fever, chills, nausea, vomiting.  Objective: Vital Signs: There were no vitals taken for this visit.  Physical Exam She is alert and oriented x3 and in no acute distress Ortho Exam Examination of her lower extremities showed no significant findings.  Both knees have no effusion with full range of motion but is painful there is no significant crepitation.  She has a positive straight leg raise on the left side but is only mild.  She has some mild bursitis to palpation of the trochanteric area of her hip and IT band syndrome.  She has pain on stretch his sciatic nerve area.  She has pain with flexion extension of the lumbar spine as well. Specialty Comments:  No specialty comments available.  Imaging: Xr Hip Unilat W Or W/o Pelvis 1v Left  Result Date: 08/08/2017 An AP pelvis and lateral of her left hip shows no acute findings of either hip or the pelvis in general.  Both hips are well located with no significant arthritic changes.  There are no cortical irregularities around the trochanteric areas.  Xr Lumbar Spine 2-3 Views  Result Date: 08/08/2017 2 views of the lumbar spine show no acute findings.  There is slight anterolisthesis of L3 on L4 and slight disc space narrowing at L4-L5 and L5-S1.    PMFS History: Patient Active Problem List   Diagnosis Date Noted  . Chronic pain of left knee 08/08/2017  . Chronic pain of right knee 08/08/2017  . Sciatica, left side 08/08/2017  . Memory loss 12/03/2013   Past Medical History:  Diagnosis Date  . Anxiety state, unspecified   . Carpal tunnel syndrome   . Carpal tunnel syndrome, bilateral   . Chiari malformation type I ([place])   . Disorder of bone and cartilage, unspecified   . Encounter for long-term (current) use of other medications   . Esophageal reflux   .  Headache   . High risk medication use   . Insomnia   . Memory loss    MMSE 24/30 on 12/05/11, 30/30 on 10/25/13  . Memory loss   . Menopausal symptoms   . Osteopenia    Hx of vitamin D deficiency  . Osteopenia   . Symptomatic menopausal or female climacteric states   . White matter abnormality on MRI of brain     Family History  Problem Relation Age of Onset  . Diabetes Mother   . Osteoarthritis Mother   . Hypertension Mother   . Dementia Mother   .  Stroke Father   . Heart attack Brother   . CAD Brother   . Breast cancer Maternal Aunt     Past Surgical History:  Procedure Laterality Date  . APPENDECTOMY     Social History   Occupational History  . 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 Smoking status: Never Smoker  . Smokeless tobacco: Never Used  Substance and Sexual Activity  . Alcohol use: No    Alcohol/week: 0.0 oz  . Drug use: No  . Sexual activity: Not on file

## 2017-08-22 ENCOUNTER — Ambulatory Visit (INDEPENDENT_AMBULATORY_CARE_PROVIDER_SITE_OTHER): Payer: Medicare Other

## 2017-08-22 ENCOUNTER — Encounter (INDEPENDENT_AMBULATORY_CARE_PROVIDER_SITE_OTHER): Payer: Self-pay | Admitting: Orthopaedic Surgery

## 2017-08-22 ENCOUNTER — Ambulatory Visit (INDEPENDENT_AMBULATORY_CARE_PROVIDER_SITE_OTHER): Payer: Medicare Other | Admitting: Orthopaedic Surgery

## 2017-08-22 DIAGNOSIS — M25561 Pain in right knee: Secondary | ICD-10-CM | POA: Diagnosis not present

## 2017-08-22 DIAGNOSIS — G8929 Other chronic pain: Secondary | ICD-10-CM

## 2017-08-22 DIAGNOSIS — M25562 Pain in left knee: Secondary | ICD-10-CM

## 2017-08-22 NOTE — Progress Notes (Signed)
The patient is someone I seen recently in the clinic.  I placed steroid injections in both her knees her chronic pain symptoms did not help one bit at all.  I want to get x-rays of her knees today since this did not help at all.  I want to consider hyaluronic acid injections but due to the fact that the steroid injection did help her 1 better touch her pain I am concerned about putting those injections in her knees today.  I want to get x-rays of her knees today to see what is going on she still scheduled for an MRI of her lumbar spine on 2 February she still has sciatic symptoms.  On examination of both knees neither knee has an effusion but have significant medial joint line tenderness on both knees.  Again she told me that the steroid injections did not even help her one single bit.  There is no effusion of either knee.  X-rays of both knees were obtained today and show no acute findings or gross abnormalities.  At this point due to the severity of her left knee pain and the failure of conservative treatment we will obtain a left knee MRI to rule out any type of internal derangement that explained the severity of her pain and take the steroid did not touch anything.  We will see her back in follow-up after having the MRI of her lumbar spine and MRI of her left knee.  All questions concerns were answered and addressed.

## 2017-08-23 ENCOUNTER — Other Ambulatory Visit (INDEPENDENT_AMBULATORY_CARE_PROVIDER_SITE_OTHER): Payer: Self-pay

## 2017-08-23 DIAGNOSIS — M25562 Pain in left knee: Principal | ICD-10-CM

## 2017-08-23 DIAGNOSIS — G8929 Other chronic pain: Secondary | ICD-10-CM

## 2017-08-24 ENCOUNTER — Ambulatory Visit
Admission: RE | Admit: 2017-08-24 | Discharge: 2017-08-24 | Disposition: A | Discharge status: Home / Self Care | Payer: Medicare Other | Source: Ambulatory Visit | Attending: Orthopaedic Surgery | Admitting: Orthopaedic Surgery

## 2017-08-24 DIAGNOSIS — M48061 Spinal stenosis, lumbar region without neurogenic claudication: Secondary | ICD-10-CM | POA: Diagnosis not present

## 2017-08-24 DIAGNOSIS — M4807 Spinal stenosis, lumbosacral region: Secondary | ICD-10-CM

## 2017-09-11 ENCOUNTER — Ambulatory Visit (INDEPENDENT_AMBULATORY_CARE_PROVIDER_SITE_OTHER): Payer: Medicare Other | Admitting: Orthopaedic Surgery

## 2017-09-13 ENCOUNTER — Ambulatory Visit
Admission: RE | Admit: 2017-09-13 | Discharge: 2017-09-13 | Disposition: A | Discharge status: Home / Self Care | Payer: Medicare Other | Source: Ambulatory Visit | Attending: Orthopaedic Surgery | Admitting: Orthopaedic Surgery

## 2017-09-13 DIAGNOSIS — M25562 Pain in left knee: Principal | ICD-10-CM

## 2017-09-13 DIAGNOSIS — G8929 Other chronic pain: Secondary | ICD-10-CM

## 2017-09-23 ENCOUNTER — Ambulatory Visit (INDEPENDENT_AMBULATORY_CARE_PROVIDER_SITE_OTHER): Payer: Medicare Other | Admitting: Orthopaedic Surgery

## 2017-09-23 ENCOUNTER — Encounter (INDEPENDENT_AMBULATORY_CARE_PROVIDER_SITE_OTHER): Payer: Self-pay | Admitting: Orthopaedic Surgery

## 2017-09-23 DIAGNOSIS — G8929 Other chronic pain: Secondary | ICD-10-CM | POA: Diagnosis not present

## 2017-09-23 DIAGNOSIS — M1712 Unilateral primary osteoarthritis, left knee: Secondary | ICD-10-CM | POA: Diagnosis not present

## 2017-09-23 DIAGNOSIS — M25562 Pain in left knee: Secondary | ICD-10-CM

## 2017-09-23 NOTE — Progress Notes (Signed)
Patient is here for follow-up to go over an MRI of her left knee.  She is been having chronic problems with that left knee and has had at least a steroid injection but continued pain is mainly on the medial side of her knee.  We finally sent her for an MRI since her joint space is well-maintained on plain films.  On exam she has good range of motion of her knee with slight varus malalignment of the left knee.  The knee feels ligamentously stable.  She does have medial joint line tenderness.  MRI is reviewed with her and it does show mild to moderate arthritic changes of the medial compartment of her knee but no areas of full-thickness cartilage loss.  The remainder of her knee actually appears normal.  There is no significant patellofemoral arthritis.  The ACL and PCL are intact as well as the medial lateral meniscus.  The collateral ligaments are all intact as well.  There is no effusion.  She is appropriate candidate for hyaluronic acid and I talked her about this in detail.  We will work on ordering this for her and we will see her back in follow-up to place a hyaluronic acid injection left knee to treat mild to moderate arthritic pain.  All questions concerns were answered and addressed she does wish to proceed with this next step.

## 2017-09-24 DIAGNOSIS — F0390 Unspecified dementia without behavioral disturbance: Secondary | ICD-10-CM | POA: Diagnosis not present

## 2017-09-24 DIAGNOSIS — Z6825 Body mass index (BMI) 25.0-25.9, adult: Secondary | ICD-10-CM | POA: Diagnosis not present

## 2017-09-24 DIAGNOSIS — M179 Osteoarthritis of knee, unspecified: Secondary | ICD-10-CM | POA: Diagnosis not present

## 2017-09-24 DIAGNOSIS — M8580 Other specified disorders of bone density and structure, unspecified site: Secondary | ICD-10-CM | POA: Diagnosis not present

## 2017-09-24 DIAGNOSIS — G4700 Insomnia, unspecified: Secondary | ICD-10-CM | POA: Diagnosis not present

## 2017-09-24 DIAGNOSIS — F419 Anxiety disorder, unspecified: Secondary | ICD-10-CM | POA: Diagnosis not present

## 2017-09-24 DIAGNOSIS — N951 Menopausal and female climacteric states: Secondary | ICD-10-CM | POA: Diagnosis not present

## 2017-09-24 DIAGNOSIS — Z79899 Other long term (current) drug therapy: Secondary | ICD-10-CM | POA: Diagnosis not present

## 2017-09-30 ENCOUNTER — Telehealth (INDEPENDENT_AMBULATORY_CARE_PROVIDER_SITE_OTHER): Payer: Self-pay

## 2017-09-30 NOTE — Telephone Encounter (Signed)
Submitted application online for Monovisc injection for left knee.

## 2017-10-08 ENCOUNTER — Telehealth (INDEPENDENT_AMBULATORY_CARE_PROVIDER_SITE_OTHER): Payer: Self-pay

## 2017-10-08 NOTE — Telephone Encounter (Signed)
Talked with patient and advised her that she is covered for Monovisc Inj., left knee.   Buy & Bill No co-pay Left Knee, Monovisc Inj. Patient may have to pay 5% which could be an estimate of $100 per knee or maybe nothing.

## 2017-10-15 DIAGNOSIS — F0390 Unspecified dementia without behavioral disturbance: Secondary | ICD-10-CM | POA: Diagnosis not present

## 2017-10-15 DIAGNOSIS — M179 Osteoarthritis of knee, unspecified: Secondary | ICD-10-CM | POA: Diagnosis not present

## 2017-10-15 DIAGNOSIS — R531 Weakness: Secondary | ICD-10-CM | POA: Diagnosis not present

## 2017-10-23 ENCOUNTER — Ambulatory Visit (INDEPENDENT_AMBULATORY_CARE_PROVIDER_SITE_OTHER): Payer: Medicare Other | Admitting: Orthopaedic Surgery

## 2017-10-23 ENCOUNTER — Encounter (INDEPENDENT_AMBULATORY_CARE_PROVIDER_SITE_OTHER): Payer: Self-pay | Admitting: Orthopaedic Surgery

## 2017-10-23 DIAGNOSIS — M1712 Unilateral primary osteoarthritis, left knee: Secondary | ICD-10-CM

## 2017-10-23 DIAGNOSIS — M1711 Unilateral primary osteoarthritis, right knee: Secondary | ICD-10-CM

## 2017-10-23 MED ORDER — HYALURONAN 88 MG/4ML IX SOSY
88.0000 mg | PREFILLED_SYRINGE | INTRA_ARTICULAR | Status: AC | PRN
  Administered 2017-10-23: 88 mg via INTRA_ARTICULAR

## 2017-10-23 NOTE — Progress Notes (Signed)
   Procedure Note  Patient: Carly Larson             Date of Birth: 1951-04-25           MRN: [id number]             Visit Date: 10/23/2017  Procedures: Visit Diagnoses: Unilateral primary osteoarthritis, left knee  Large Joint Inj: L knee on 10/23/2017 1:17 PM Indications: pain and diagnostic evaluation Details: 22 G 1.5 in needle, superolateral approach  Arthrogram: No  Medications: 88 mg Hyaluronan 88 MG/4ML Outcome: tolerated well, no immediate complications Procedure, treatment alternatives, risks and benefits explained, specific risks discussed. Consent was given by the patient. Immediately prior to procedure a time out was called to verify the correct patient, procedure, equipment, support staff and site/side marked as required. Patient was prepped and draped in the usual sterile fashion.   Large Joint Inj: R knee on 10/23/2017 1:24 PM Indications: pain, diagnostic evaluation and joint swelling Details: 22 G 1.5 in needle, superolateral approach  Arthrogram: No  Medications: 88 mg Hyaluronan 88 MG/4ML Outcome: tolerated well, no immediate complications Procedure, treatment alternatives, risks and benefits explained, specific risks discussed. Consent was given by the patient. Immediately prior to procedure a time out was called to verify the correct patient, procedure, equipment, support staff and site/side marked as required. Patient was prepped and draped in the usual sterile fashion.     The patient has known moderate osteoarthritis degenerative joint disease in both her knees.  She has tried steroid injections and now is here today for scheduled Monovisc injections in her knees.  This is to treat the pain from moderate osteoarthritis.  She understands fully the risk benefits injections and I explained her in detail showing of injections and literature about it.  She tolerated injections well.  She always knows that she can get steroid injections again in about 3 months if she  needs it.  All questions and concerns were answered and addressed.

## 2017-11-28 DIAGNOSIS — F0390 Unspecified dementia without behavioral disturbance: Secondary | ICD-10-CM | POA: Diagnosis not present

## 2017-11-28 DIAGNOSIS — Z6825 Body mass index (BMI) 25.0-25.9, adult: Secondary | ICD-10-CM | POA: Diagnosis not present

## 2017-11-28 DIAGNOSIS — R011 Cardiac murmur, unspecified: Secondary | ICD-10-CM | POA: Diagnosis not present

## 2017-11-28 DIAGNOSIS — R55 Syncope and collapse: Secondary | ICD-10-CM | POA: Diagnosis not present

## 2017-12-11 ENCOUNTER — Telehealth: Payer: Self-pay

## 2017-12-11 NOTE — Telephone Encounter (Signed)
SENT REFERRAL TO SCHEDULING 

## 2017-12-20 DIAGNOSIS — R011 Cardiac murmur, unspecified: Secondary | ICD-10-CM

## 2017-12-20 DIAGNOSIS — F0390 Unspecified dementia without behavioral disturbance: Secondary | ICD-10-CM

## 2017-12-20 DIAGNOSIS — R55 Syncope and collapse: Secondary | ICD-10-CM

## 2017-12-23 DIAGNOSIS — R55 Syncope and collapse: Secondary | ICD-10-CM | POA: Insufficient documentation

## 2017-12-23 DIAGNOSIS — F0390 Unspecified dementia without behavioral disturbance: Secondary | ICD-10-CM | POA: Insufficient documentation

## 2017-12-23 DIAGNOSIS — R011 Cardiac murmur, unspecified: Secondary | ICD-10-CM | POA: Insufficient documentation

## 2017-12-24 ENCOUNTER — Ambulatory Visit (INDEPENDENT_AMBULATORY_CARE_PROVIDER_SITE_OTHER): Payer: Medicare Other | Admitting: Cardiology

## 2017-12-24 ENCOUNTER — Encounter: Payer: Self-pay | Admitting: Cardiology

## 2017-12-24 VITALS — BP 110/62 | HR 64 | Ht 60.0 in | Wt 123.0 lb

## 2017-12-24 DIAGNOSIS — F0390 Unspecified dementia without behavioral disturbance: Secondary | ICD-10-CM

## 2017-12-24 DIAGNOSIS — R413 Other amnesia: Secondary | ICD-10-CM | POA: Diagnosis not present

## 2017-12-24 DIAGNOSIS — R011 Cardiac murmur, unspecified: Secondary | ICD-10-CM

## 2017-12-24 DIAGNOSIS — R55 Syncope and collapse: Secondary | ICD-10-CM

## 2017-12-24 NOTE — Progress Notes (Signed)
Cardiology Consultation:    Date:  12/24/2017   ID:  Carly Larson, DOB January 09, 1951, MRN [id number]  PCP:  Cyndi Bender, PA-C  Cardiologist:  Jenne Campus, MD   Referring MD: Alonna Buckler*   Chief Complaint  Patient presents with  . Loss of Consciousness  Passing out  History of Present Illness:    Carly Larson is a 67 y.o. female who is being seen today for the evaluation of passing out at the request of Alonna Buckler*.  For last 3 months she had 3 episodes of passing out first episode she was sitting at the table she got up and started walking started feeling strange and then next thing she knew she was on the floor according to her husband who was a witness of this event she was unconscious for about 5 seconds.  2 additional episodes happened when she was in a hot it happening [employer] she would sit in the hot tub for 7 to 10 minutes then when she was walking out of the hot tub she would get dizzy and passed out her husband was also able to catch her.  She always feel sweaty just before it happens.  There is no chest pain no palpitation tightness squeezing pressure burning chest.  She never had episode like this before. She does have some degree of dementia therefore history is somewhat difficult to obtain all history is obtained from her husband.  She adds very little to it.  She has never had any heart trouble and she was recently told to have a heart murmur.  She try to exercise on the regular basis and goes to [facility].  Past Medical History:  Diagnosis Date  . Anxiety state, unspecified   . Carpal tunnel syndrome   . Carpal tunnel syndrome, bilateral   . Chiari malformation type I ([place])   . Disorder of bone and cartilage, unspecified   . Encounter for long-term (current) use of other medications   . Esophageal reflux   . Headache   . High risk medication use   . Insomnia   . Memory loss    MMSE 24/30 on 12/05/11, 30/30 on 10/25/13  . Memory loss   .  Menopausal symptoms   . Osteopenia    Hx of vitamin D deficiency  . Osteopenia   . Symptomatic menopausal or female climacteric states   . White matter abnormality on MRI of brain     Past Surgical History:  Procedure Laterality Date  . APPENDECTOMY    . CATARACT EXTRACTION, BILATERAL      Current Medications: Current Meds  Medication Sig  . alendronate (FOSAMAX) 70 MG tablet Take 70 mg by mouth once a week. Take with a full glass of water on an empty stomach.  Marland Kitchen aspirin EC 81 MG tablet Take 81 mg by mouth daily.  . Cholecalciferol (VITAMIN D-3) 1000 UNITS CAPS Take 1 capsule by mouth 2 (two) times daily.  Marland Kitchen donepezil (ARICEPT) 23 MG TABS tablet Take 23 mg by mouth at bedtime.   . gabapentin (NEURONTIN) 300 MG capsule Take 1 capsule (300 mg total) by mouth 2 (two) times daily.  . meloxicam (MOBIC) 15 MG tablet Take 15 mg by mouth daily.  . memantine (NAMENDA) 10 MG tablet Take 10 mg by mouth 2 (two) times daily.  . Multiple Vitamin (MULTIVITAMIN) capsule Take 1 capsule by mouth daily.  . Potassium 99 MG TABS Take 1 tablet by mouth 2 (two) times daily.  Marland Kitchen  temazepam (RESTORIL) 30 MG capsule Take 30 mg by mouth at bedtime as needed for sleep.  Marland Kitchen thiamine (VITAMIN B-1) 100 MG tablet Take 100 mg by mouth daily.  Marland Kitchen venlafaxine XR (EFFEXOR-XR) 150 MG 24 hr capsule Take 150 mg by mouth 2 (two) times daily.     Allergies:   Patient has no known allergies.   Social History   Socioeconomic History  . Marital status: Married    Spouse name: Jeani Hawking   . Number of children: 1  . Years of education: 12+  . Highest education level: Not on file  Occupational History  . Occupation: Teacher, music: [employer]  . Financial resource strain: Not on file  . Food insecurity:    Worry: Not on file    Inability: Not on file  . Transportation needs:    Medical: Not on file    Non-medical: Not on file  Tobacco Use  . Smoking status: Never Smoker  . Smokeless  tobacco: Never Used  Substance and Sexual Activity  . Alcohol use: No    Alcohol/week: 0.0 oz  . Drug use: No  . Sexual activity: Not on file  Lifestyle  . Physical activity:    Days per week: Not on file    Minutes per session: Not on file  . Stress: Not on file  Relationships  . Social connections:    Talks on phone: Not on file    Gets together: Not on file    Attends religious service: Not on file    Active member of club or organization: Not on file    Attends meetings of clubs or organizations: Not on file    Relationship status: Not on file  Other Topics Concern  . Not on file  Social History Narrative   Patient lives at home with husband. Jeani Hawking   Patient has 1 child.    Patient is left handed.    Patient is currently working    Patient has a college education    3 cups of coffee a day      Family History: The patient's family history includes Breast cancer in her maternal aunt; CAD in her brother; Dementia in her mother; Diabetes in her mother; Heart attack in her brother; Hypertension in her mother; Osteoarthritis in her mother; Stroke in her father. ROS:   Please see the history of present illness.    All 14 point review of systems negative except as described per history of present illness.  EKGs/Labs/Other Studies Reviewed:    The following studies were reviewed today:   EKG:  EKG is  ordered today.  The ekg ordered today demonstrates dermal sinus rhythm normal P interval normal QS complex duration morphology  Recent Labs: No results found for requested labs within last 8760 hours.  Recent Lipid Panel No results found for: CHOL, TRIG, HDL, CHOLHDL, VLDL, LDLCALC, LDLDIRECT  Physical Exam:    VS:  BP 110/62   Pulse 64   Ht 5' (1.524 m)   Wt 123 lb (55.8 kg)   SpO2 99%   BMI 24.02 kg/m     Wt Readings from Last 3 Encounters:  12/24/17 123 lb (55.8 kg)  05/27/17 123 lb (55.8 kg)  11/23/14 116 lb (52.6 kg)     GEN:  Well nourished, well developed  in no acute distress HEENT: Normal NECK: No JVD; No carotid bruits LYMPHATICS: No lymphadenopathy CARDIAC: RRR, soft diastolic murmur grade 1/6 best  heard left border sternum., no rubs, no gallops RESPIRATORY:  Clear to auscultation without rales, wheezing or rhonchi  ABDOMEN: Soft, non-tender, non-distended MUSCULOSKELETAL:  No edema; No deformity  SKIN: Warm and dry NEUROLOGIC:  Alert and oriented x 3 PSYCHIATRIC:  Normal affect   ASSESSMENT:    1. Syncope, unspecified syncope type   2. Heart murmur   3. Memory loss   4. Dementia without behavioral disturbance, unspecified dementia type    PLAN:    In order of problems listed above:  1. Syncope.  Of these 2 episodes appears to be vasovagal/orthostatic when she was leaving her heart.  Obviously I told her not to go to have that anymore.  However another episodes when she got up of the table make me concerned.  Part of it may look somewhat vasovagal however very brief prodromal symptoms make me somewhat concerned that it may be related to arrhythmia.  I think it would be reasonable to put monitor on her for a month.  I will also ask her to have an echocardiogram to assess the murmur as well as make sure structurally her heart is normal.  I told her not to swim at the pool that we have the situation clarified. 2. Heart murmur: Echocardiogram will be done 3. Memory loss with dementia that being followed by internal medicine team.  Her back in 6 weeks or sooner if he has a problem   Medication Adjustments/Labs and Tests Ordered: Current medicines are reviewed at length with the patient today.  Concerns regarding medicines are outlined above.  No orders of the defined types were placed in this encounter.  No orders of the defined types were placed in this encounter.   Signed, Park Liter, MD, [facility]. 12/24/2017 9:30 AM    [place]

## 2017-12-24 NOTE — Patient Instructions (Signed)
Medication Instructions:  Your physician recommends that you continue on your current medications as directed. Please refer to the Current Medication list given to you today.   Labwork: None  Testing/Procedures: You had an EKG today.  Your physician has requested that you have an echocardiogram. Echocardiography is a painless test that uses sound waves to create images of your heart. It provides your doctor with information about the size and shape of your heart and how well your heart's chambers and valves are working. This procedure takes approximately one hour. There are no restrictions for this procedure.  Your physician has recommended that you wear an event monitor. Event monitors are medical devices that record the heart's electrical activity. Doctors most often us these monitors to diagnose arrhythmias. Arrhythmias are problems with the speed or rhythm of the heartbeat. The monitor is a small, portable device. You can wear one while you do your normal daily activities. This is usually used to diagnose what is causing palpitations/syncope (passing out). Wear for 30 days.   Follow-Up: Your physician recommends that you schedule a follow-up appointment in: 6 weeks.   If you need a refill on your cardiac medications before your next appointment, please call your pharmacy.   Thank you for choosing [facility]! Catherine Lockhart, RN [phone]    

## 2017-12-30 ENCOUNTER — Other Ambulatory Visit (HOSPITAL_BASED_OUTPATIENT_CLINIC_OR_DEPARTMENT_OTHER): Payer: Medicare Other

## 2018-01-09 ENCOUNTER — Ambulatory Visit (HOSPITAL_BASED_OUTPATIENT_CLINIC_OR_DEPARTMENT_OTHER)
Admission: RE | Admit: 2018-01-09 | Discharge: 2018-01-09 | Disposition: A | Discharge status: Home / Self Care | Payer: Medicare Other | Source: Ambulatory Visit | Attending: Cardiology | Admitting: Cardiology

## 2018-01-09 ENCOUNTER — Ambulatory Visit: Payer: Medicare Other

## 2018-01-09 DIAGNOSIS — I080 Rheumatic disorders of both mitral and aortic valves: Secondary | ICD-10-CM | POA: Insufficient documentation

## 2018-01-09 DIAGNOSIS — R011 Cardiac murmur, unspecified: Secondary | ICD-10-CM | POA: Insufficient documentation

## 2018-01-09 DIAGNOSIS — R55 Syncope and collapse: Secondary | ICD-10-CM

## 2018-01-09 NOTE — Progress Notes (Signed)
Echocardiogram 2D Echocardiogram has been performed.  Joelene Millin 01/09/2018, 3:38 PM

## 2018-02-05 DIAGNOSIS — F419 Anxiety disorder, unspecified: Secondary | ICD-10-CM | POA: Diagnosis not present

## 2018-02-05 DIAGNOSIS — M7062 Trochanteric bursitis, left hip: Secondary | ICD-10-CM | POA: Diagnosis not present

## 2018-02-05 DIAGNOSIS — Z1339 Encounter for screening examination for other mental health and behavioral disorders: Secondary | ICD-10-CM | POA: Diagnosis not present

## 2018-02-10 ENCOUNTER — Ambulatory Visit (INDEPENDENT_AMBULATORY_CARE_PROVIDER_SITE_OTHER): Payer: Medicare Other | Admitting: Cardiology

## 2018-02-10 VITALS — BP 144/66 | HR 101 | Ht 60.0 in | Wt 126.4 lb

## 2018-02-10 DIAGNOSIS — F0390 Unspecified dementia without behavioral disturbance: Secondary | ICD-10-CM

## 2018-02-10 DIAGNOSIS — R011 Cardiac murmur, unspecified: Secondary | ICD-10-CM

## 2018-02-10 DIAGNOSIS — R55 Syncope and collapse: Secondary | ICD-10-CM | POA: Diagnosis not present

## 2018-02-10 NOTE — Progress Notes (Signed)
Cardiology Office Note:    Date:  02/10/2018   ID:  Carly Larson, DOB [birth year], MRN [id number]  PCP:  Cyndi Bender, PA-C  Cardiologist:  Jenne Campus, MD    Referring MD: Cyndi Bender, PA-C   Chief Complaint  Patient presents with  . 6 week follow up  Doing well denies having episode of syncope  History of Present Illness:    Carly Larson is a 67 y.o. female with 3 episodes of syncope.  At least 2 or looking like vasovagal.  However one was suspicion for possibility of arrhythmia I did echocardiogram which showed preserved left ventricular ejection fraction and insignificant leaks from the valves.  She wear event recorder for a month said the echo 2017 hrs. of recording.  She had difficulty wearing the monitor.  Luckily since time I seen her last time there is no more episode of syncope.  She seems to be doing well she goes to [facility] on the regular basis  Past Medical History:  Diagnosis Date  . Anxiety state, unspecified   . Carpal tunnel syndrome   . Carpal tunnel syndrome, bilateral   . Chiari malformation type I ([place])   . Disorder of bone and cartilage, unspecified   . Encounter for long-term (current) use of other medications   . Esophageal reflux   . Headache   . High risk medication use   . Insomnia   . Memory loss    MMSE 24/30 on 12/05/11, 30/30 on 10/25/13  . Memory loss   . Menopausal symptoms   . Osteopenia    Hx of vitamin D deficiency  . Osteopenia   . Symptomatic menopausal or female climacteric states   . White matter abnormality on MRI of brain     Past Surgical History:  Procedure Laterality Date  . APPENDECTOMY    . CATARACT EXTRACTION, BILATERAL      Current Medications: Current Meds  Medication Sig  . alendronate (FOSAMAX) 70 MG tablet Take 70 mg by mouth once a week. Take with a full glass of water on an empty stomach.  Marland Kitchen aspirin EC 81 MG tablet Take 81 mg by mouth daily.  . Cholecalciferol (VITAMIN D-3) 1000 UNITS CAPS Take 1 capsule  by mouth 2 (two) times daily.  Marland Kitchen donepezil (ARICEPT) 23 MG TABS tablet Take 23 mg by mouth at bedtime.   . gabapentin (NEURONTIN) 300 MG capsule Take 1 capsule (300 mg total) by mouth 2 (two) times daily.  . meloxicam (MOBIC) 15 MG tablet Take 15 mg by mouth daily.  . memantine (NAMENDA) 10 MG tablet Take 10 mg by mouth 2 (two) times daily.  . methocarbamol (ROBAXIN) 500 MG tablet Take 1 tablet (500 mg total) by mouth every 6 (six) hours as needed for muscle spasms.  . Multiple Vitamin (MULTIVITAMIN) capsule Take 1 capsule by mouth daily.  . Potassium 99 MG TABS Take 1 tablet by mouth 2 (two) times daily.  . temazepam (RESTORIL) 30 MG capsule Take 30 mg by mouth at bedtime as needed for sleep.  Marland Kitchen thiamine (VITAMIN B-1) 100 MG tablet Take 100 mg by mouth daily.  Marland Kitchen venlafaxine XR (EFFEXOR-XR) 150 MG 24 hr capsule Take 150 mg by mouth 2 (two) times daily.     Allergies:   Patient has no known allergies.   Social History   Socioeconomic History  . Marital status: Married    Spouse name: Jeani Hawking   . Number of children: 1  . Years of education: 12+  .  Highest education level: Not on file  Occupational History  . Occupation: Teacher, music: [employer]  . Financial resource strain: Not on file  . Food insecurity:    Worry: Not on file    Inability: Not on file  . Transportation needs:    Medical: Not on file    Non-medical: Not on file  Tobacco Use  . Smoking status: Never Smoker  . Smokeless tobacco: Never Used  Substance and Sexual Activity  . Alcohol use: No    Alcohol/week: 0.0 oz  . Drug use: No  . Sexual activity: Not on file  Lifestyle  . Physical activity:    Days per week: Not on file    Minutes per session: Not on file  . Stress: Not on file  Relationships  . Social connections:    Talks on phone: Not on file    Gets together: Not on file    Attends religious service: Not on file    Active member of club or organization: Not on file      Attends meetings of clubs or organizations: Not on file    Relationship status: Not on file  Other Topics Concern  . Not on file  Social History Narrative   Patient lives at home with husband. Jeani Hawking   Patient has 1 child.    Patient is left handed.    Patient is currently working    Patient has a college education    3 cups of coffee a day      Family History: The patient's family history includes Breast cancer in her maternal aunt; CAD in her brother; Dementia in her mother; Diabetes in her mother; Heart attack in her brother; Hypertension in her mother; Osteoarthritis in her mother; Stroke in her father. ROS:   Please see the history of present illness.    All 14 point review of systems negative except as described per history of present illness  EKGs/Labs/Other Studies Reviewed:      Recent Labs: No results found for requested labs within last 8760 hours.  Recent Lipid Panel No results found for: CHOL, TRIG, HDL, CHOLHDL, VLDL, LDLCALC, LDLDIRECT  Physical Exam:    VS:  BP (!) 144/66   Pulse (!) 101   Ht 5' (1.524 m)   Wt 126 lb 6.4 oz (57.3 kg)   SpO2 98%   BMI 24.69 kg/m     Wt Readings from Last 3 Encounters:  02/10/18 126 lb 6.4 oz (57.3 kg)  12/24/17 123 lb (55.8 kg)  05/27/17 123 lb (55.8 kg)     GEN:  Well nourished, well developed in no acute distress HEENT: Normal NECK: No JVD; No carotid bruits LYMPHATICS: No lymphadenopathy CARDIAC: RRR, n systolic and diastolic murmur heard at the left upper portion of the sternum grade 1/6 to 2/6., no rubs, no gallops RESPIRATORY:  Clear to auscultation without rales, wheezing or rhonchi  ABDOMEN: Soft, non-tender, non-distended MUSCULOSKELETAL:  No edema; No deformity  SKIN: Warm and dry LOWER EXTREMITIES: no swelling NEUROLOGIC:  Alert and oriented x 3 PSYCHIATRIC:  Normal affect   ASSESSMENT:    1. Vasovagal syncope   2. Dementia without behavioral disturbance, unspecified dementia type   3. Heart  murmur    PLAN:    In order of problems listed above:  1. Vasovagal syncope.  We talked about that I  I told her how to avoid triggers.  I encouraged her to make sure  she is doing well hydrated and also reports some salt intake.  I see her back in my office in about 3 months if she was still have recurrences of syncope that would consider implantable loop recorder. 2. Dementia: Stable followed by internal medicine team. 3. Heart murmur: No critical lesions identified.  Plan is to see her back in 3 months or sooner if she has a problem   Medication Adjustments/Labs and Tests Ordered: Current medicines are reviewed at length with the patient today.  Concerns regarding medicines are outlined above.  No orders of the defined types were placed in this encounter.  Medication changes: No orders of the defined types were placed in this encounter.   Signed, Park Liter, MD, [facility] 02/10/2018 8:58 AM    [place]

## 2018-02-10 NOTE — Patient Instructions (Signed)
Medication Instructions:  Your physician recommends that you continue on your current medications as directed. Please refer to the Current Medication list given to you today.  Labwork: none  Testing/Procedures: None  Follow-Up: Your physician recommends that you schedule a follow-up appointment in: 3 months    Any Other Special Instructions Will Be Listed Below (If Applicable).     If you need a refill on your cardiac medications before your next appointment, please call your pharmacy.   

## 2018-02-19 ENCOUNTER — Telehealth: Payer: Self-pay

## 2018-02-19 NOTE — Telephone Encounter (Signed)
Called patient and could not leave voicemail because patient voicemail has not been set up so will continue my efforts and call again.

## 2018-02-21 ENCOUNTER — Telehealth: Payer: Self-pay

## 2018-02-21 NOTE — Telephone Encounter (Signed)
Called patient three times and no answer and voice mailbox is full so will mail it.

## 2018-03-13 DIAGNOSIS — M48061 Spinal stenosis, lumbar region without neurogenic claudication: Secondary | ICD-10-CM | POA: Diagnosis not present

## 2018-03-13 DIAGNOSIS — M5116 Intervertebral disc disorders with radiculopathy, lumbar region: Secondary | ICD-10-CM | POA: Diagnosis not present

## 2018-03-13 DIAGNOSIS — Z6824 Body mass index (BMI) 24.0-24.9, adult: Secondary | ICD-10-CM | POA: Diagnosis not present

## 2018-04-03 ENCOUNTER — Other Ambulatory Visit (INDEPENDENT_AMBULATORY_CARE_PROVIDER_SITE_OTHER): Payer: Self-pay | Admitting: Orthopaedic Surgery

## 2018-04-03 NOTE — Telephone Encounter (Signed)
Please advise 

## 2018-04-14 DIAGNOSIS — Z136 Encounter for screening for cardiovascular disorders: Secondary | ICD-10-CM | POA: Diagnosis not present

## 2018-04-14 DIAGNOSIS — Z1239 Encounter for other screening for malignant neoplasm of breast: Secondary | ICD-10-CM | POA: Diagnosis not present

## 2018-04-14 DIAGNOSIS — Z1331 Encounter for screening for depression: Secondary | ICD-10-CM | POA: Diagnosis not present

## 2018-04-14 DIAGNOSIS — M48061 Spinal stenosis, lumbar region without neurogenic claudication: Secondary | ICD-10-CM | POA: Diagnosis not present

## 2018-04-14 DIAGNOSIS — M5430 Sciatica, unspecified side: Secondary | ICD-10-CM | POA: Diagnosis not present

## 2018-04-14 DIAGNOSIS — Z9181 History of falling: Secondary | ICD-10-CM | POA: Diagnosis not present

## 2018-04-14 DIAGNOSIS — Z139 Encounter for screening, unspecified: Secondary | ICD-10-CM | POA: Diagnosis not present

## 2018-04-14 DIAGNOSIS — E785 Hyperlipidemia, unspecified: Secondary | ICD-10-CM | POA: Diagnosis not present

## 2018-04-14 DIAGNOSIS — Z23 Encounter for immunization: Secondary | ICD-10-CM | POA: Diagnosis not present

## 2018-04-14 DIAGNOSIS — N959 Unspecified menopausal and perimenopausal disorder: Secondary | ICD-10-CM | POA: Diagnosis not present

## 2018-04-14 DIAGNOSIS — Z0000 Encounter for general adult medical examination without abnormal findings: Secondary | ICD-10-CM | POA: Diagnosis not present

## 2018-04-15 ENCOUNTER — Other Ambulatory Visit: Payer: Self-pay | Admitting: Physician Assistant

## 2018-04-15 DIAGNOSIS — Z1231 Encounter for screening mammogram for malignant neoplasm of breast: Secondary | ICD-10-CM

## 2018-04-15 DIAGNOSIS — E2839 Other primary ovarian failure: Secondary | ICD-10-CM

## 2018-04-23 DIAGNOSIS — M25652 Stiffness of left hip, not elsewhere classified: Secondary | ICD-10-CM | POA: Diagnosis not present

## 2018-04-23 DIAGNOSIS — M47816 Spondylosis without myelopathy or radiculopathy, lumbar region: Secondary | ICD-10-CM | POA: Diagnosis not present

## 2018-04-23 DIAGNOSIS — M6281 Muscle weakness (generalized): Secondary | ICD-10-CM | POA: Diagnosis not present

## 2018-05-05 DIAGNOSIS — M48061 Spinal stenosis, lumbar region without neurogenic claudication: Secondary | ICD-10-CM | POA: Diagnosis not present

## 2018-05-07 DIAGNOSIS — M47816 Spondylosis without myelopathy or radiculopathy, lumbar region: Secondary | ICD-10-CM | POA: Diagnosis not present

## 2018-05-07 DIAGNOSIS — M25652 Stiffness of left hip, not elsewhere classified: Secondary | ICD-10-CM | POA: Diagnosis not present

## 2018-05-07 DIAGNOSIS — M6281 Muscle weakness (generalized): Secondary | ICD-10-CM | POA: Diagnosis not present

## 2018-05-12 ENCOUNTER — Ambulatory Visit (INDEPENDENT_AMBULATORY_CARE_PROVIDER_SITE_OTHER): Payer: Medicare Other | Admitting: Orthopaedic Surgery

## 2018-05-12 ENCOUNTER — Encounter (INDEPENDENT_AMBULATORY_CARE_PROVIDER_SITE_OTHER): Payer: Self-pay | Admitting: Orthopaedic Surgery

## 2018-05-12 DIAGNOSIS — M4807 Spinal stenosis, lumbosacral region: Secondary | ICD-10-CM | POA: Diagnosis not present

## 2018-05-12 DIAGNOSIS — G8929 Other chronic pain: Secondary | ICD-10-CM

## 2018-05-12 DIAGNOSIS — M5432 Sciatica, left side: Secondary | ICD-10-CM

## 2018-05-12 DIAGNOSIS — M25562 Pain in left knee: Secondary | ICD-10-CM

## 2018-05-12 NOTE — Progress Notes (Signed)
The patient somewhat seen before.  She is been dealing with left hip and left knee pain also findings in the lumbar spine.  This has been a vague type of pain.  We have an MRI of her left knee.  Left hip x-rays were normal and left knee x-rays are normal.  The MRI of her left knee showed only mild arthritic changes.  He does wear knee brace on that knee.  I provided a steroid injection of the trochanteric area which made her pain go away for only one day.  She is been to physical therapy as well.  MRI of her lumbar spine did show significant changes L3-L4 and L4-L5 this may be contributing to some of her sciatic symptoms.  Some days she is pain-free.  I see notes that indicates she has mild touch of dementia but I did not address that with her husband today.  On exam I can easily put her left hip and left knee through motion without any difficulties at all but she does withdraw to some pain but is hard to tell where it is.  At this point I would like to send her to Dr. Ernestina Patches to consider left-sided L3-L4 injection to see if this would help with her sciatic symptoms.  From my standpoint is not much else I did offer.  We will see her back in 4 weeks and hopefully she will have had an intervention by Dr. Ernestina Patches and continue therapy.

## 2018-05-13 ENCOUNTER — Other Ambulatory Visit (INDEPENDENT_AMBULATORY_CARE_PROVIDER_SITE_OTHER): Payer: Self-pay

## 2018-05-13 DIAGNOSIS — M5432 Sciatica, left side: Secondary | ICD-10-CM

## 2018-05-27 ENCOUNTER — Ambulatory Visit (INDEPENDENT_AMBULATORY_CARE_PROVIDER_SITE_OTHER): Payer: Medicare Other | Admitting: Physical Medicine and Rehabilitation

## 2018-05-27 ENCOUNTER — Encounter (INDEPENDENT_AMBULATORY_CARE_PROVIDER_SITE_OTHER): Payer: Self-pay | Admitting: Physical Medicine and Rehabilitation

## 2018-05-27 ENCOUNTER — Ambulatory Visit (INDEPENDENT_AMBULATORY_CARE_PROVIDER_SITE_OTHER): Payer: Self-pay

## 2018-05-27 VITALS — BP 127/60 | HR 82 | Temp 98.3°F

## 2018-05-27 DIAGNOSIS — M5416 Radiculopathy, lumbar region: Secondary | ICD-10-CM | POA: Diagnosis not present

## 2018-05-27 MED ORDER — BETAMETHASONE SOD PHOS & ACET 6 (3-3) MG/ML IJ SUSP
12.0000 mg | Freq: Once | INTRAMUSCULAR | Status: AC
  Administered 2018-05-27: 12 mg

## 2018-05-27 NOTE — Progress Notes (Signed)
 .  Numeric Pain Rating Scale and Functional Assessment Average Pain 10   In the last MONTH (on 0-10 scale) has pain interfered with the following?  1. General activity like being  able to carry out your everyday physical activities such as walking, climbing stairs, carrying groceries, or moving a chair?  Rating(6)   +Driver, -BT, -Dye Allergies.  

## 2018-05-27 NOTE — Patient Instructions (Signed)

## 2018-05-27 NOTE — Procedures (Signed)
Lumbosacral Transforaminal Epidural Steroid Injection - Sub-Pedicular Approach with Fluoroscopic Guidance  Patient: Carly Larson      Date of Birth: 09/19/1950 MRN: [id number] PCP: Cyndi Bender, PA-C      Visit Date: 05/27/2018   Universal Protocol:    Date/Time: 05/27/2018  Consent Given By: the patient  Position: PRONE  Additional Comments: Vital signs were monitored before and after the procedure. Patient was prepped and draped in the usual sterile fashion. The correct patient, procedure, and site was verified.   Injection Procedure Details:  Procedure Site One Meds Administered:  Meds ordered this encounter  Medications  . betamethasone acetate-betamethasone sodium phosphate (CELESTONE) injection 12 mg    Laterality: Left  Location/Site:  L3-L4  Needle size: 22 G  Needle type: Spinal  Needle Placement: Transforaminal  Findings:    -Comments: Excellent flow of contrast along the nerve and into the epidural space.  Procedure Details: After squaring off the end-plates to get a true AP view, the C-arm was positioned so that an oblique view of the foramen as noted above was visualized. The target area is just inferior to the "nose of the scotty dog" or sub pedicular. The soft tissues overlying this structure were infiltrated with 2-3 ml. of 1% Lidocaine without Epinephrine.  The spinal needle was inserted toward the target using a "trajectory" view along the fluoroscope beam.  Under AP and lateral visualization, the needle was advanced so it did not puncture dura and was located close the 6 O'Clock position of the pedical in AP tracterory. Biplanar projections were used to confirm position. Aspiration was confirmed to be negative for CSF and/or blood. A 1-2 ml. volume of Isovue-250 was injected and flow of contrast was noted at each level. Radiographs were obtained for documentation purposes.   After attaining the desired flow of contrast documented above, a 0.5 to 1.0  ml test dose of 0.25% Marcaine was injected into each respective transforaminal space.  The patient was observed for 90 seconds post injection.  After no sensory deficits were reported, and normal lower extremity motor function was noted,   the above injectate was administered so that equal amounts of the injectate were placed at each foramen (level) into the transforaminal epidural space.   Additional Comments:  The patient tolerated the procedure well Dressing: Band-Aid    Post-procedure details: Patient was observed during the procedure. Post-procedure instructions were reviewed.  Patient left the clinic in stable condition.

## 2018-06-09 ENCOUNTER — Ambulatory Visit (INDEPENDENT_AMBULATORY_CARE_PROVIDER_SITE_OTHER): Payer: Medicare Other | Admitting: Orthopaedic Surgery

## 2018-06-09 ENCOUNTER — Encounter (INDEPENDENT_AMBULATORY_CARE_PROVIDER_SITE_OTHER): Payer: Self-pay | Admitting: Orthopaedic Surgery

## 2018-06-09 DIAGNOSIS — M5432 Sciatica, left side: Secondary | ICD-10-CM | POA: Diagnosis not present

## 2018-06-09 NOTE — Progress Notes (Signed)
[name] - 67 y.o. female MRN [id number]  Date of birth: Jul 06, 1951  Office Visit Note: Visit Date: 05/27/2018 PCP: Cyndi Bender, PA-C Referred by: Cyndi Bender, PA-C  Subjective: Chief Complaint  Patient presents with  . Lower Back - Pain  . Left Thigh - Pain   HPI:  Carly Larson is a 67 y.o. female who comes in today For planned left L3 transforaminal epidural steroid injection for chronic worsening severe left sided low back and hip and leg pain.  She is failed conservative care through Dr. Ninfa Linden and he did order an MRI of the lumbar spine which does show severe multifactorial stenosis at L3-4 with listhesis.  ROS Otherwise per HPI.  Assessment & Plan: Visit Diagnoses:  1. Lumbar radiculopathy     Plan: No additional findings.   Meds & Orders:  Meds ordered this encounter  Medications  . betamethasone acetate-betamethasone sodium phosphate (CELESTONE) injection 12 mg    Orders Placed This Encounter  Procedures  . XR C-ARM NO REPORT  . Epidural Steroid injection    Follow-up: Return for Jean Rosenthal, M.D..   Procedures: No procedures performed  Lumbosacral Transforaminal Epidural Steroid Injection - Sub-Pedicular Approach with Fluoroscopic Guidance  Patient: [name]      Date of Birth: Feb 11, 1951 MRN: [id number] PCP: Cyndi Bender, PA-C      Visit Date: 05/27/2018   Universal Protocol:    Date/Time: 05/27/2018  Consent Given By: the patient  Position: PRONE  Additional Comments: Vital signs were monitored before and after the procedure. Patient was prepped and draped in the usual sterile fashion. The correct patient, procedure, and site was verified.   Injection Procedure Details:  Procedure Site One Meds Administered:  Meds ordered this encounter  Medications  . betamethasone acetate-betamethasone sodium phosphate (CELESTONE) injection 12 mg    Laterality: Left  Location/Site:  L3-L4  Needle size: 22 G  Needle type:  Spinal  Needle Placement: Transforaminal  Findings:    -Comments: Excellent flow of contrast along the nerve and into the epidural space.  Procedure Details: After squaring off the end-plates to get a true AP view, the C-arm was positioned so that an oblique view of the foramen as noted above was visualized. The target area is just inferior to the "nose of the scotty dog" or sub pedicular. The soft tissues overlying this structure were infiltrated with 2-3 ml. of 1% Lidocaine without Epinephrine.  The spinal needle was inserted toward the target using a "trajectory" view along the fluoroscope beam.  Under AP and lateral visualization, the needle was advanced so it did not puncture dura and was located close the 6 O'Clock position of the pedical in AP tracterory. Biplanar projections were used to confirm position. Aspiration was confirmed to be negative for CSF and/or blood. A 1-2 ml. volume of Isovue-250 was injected and flow of contrast was noted at each level. Radiographs were obtained for documentation purposes.   After attaining the desired flow of contrast documented above, a 0.5 to 1.0 ml test dose of 0.25% Marcaine was injected into each respective transforaminal space.  The patient was observed for 90 seconds post injection.  After no sensory deficits were reported, and normal lower extremity motor function was noted,   the above injectate was administered so that equal amounts of the injectate were placed at each foramen (level) into the transforaminal epidural space.   Additional Comments:  The patient tolerated the procedure well Dressing: Band-Aid    Post-procedure details:  Patient was observed during the procedure. Post-procedure instructions were reviewed.  Patient left the clinic in stable condition.     Clinical History: MRI LUMBAR SPINE WITHOUT CONTRAST  TECHNIQUE: Multiplanar, multisequence MR imaging of the lumbar spine was performed. No intravenous contrast was  administered.  COMPARISON:  None.  FINDINGS: Segmentation:  Standard.  Alignment: 4 mm anterolisthesis of L3 on L4 secondary to facet disease.  Vertebrae:  No fracture, evidence of discitis, or bone lesion.  Conus medullaris and cauda equina: Conus extends to the T12 level. Conus and cauda equina appear normal.  Paraspinal and other soft tissues: No focal paraspinal abnormality.  Disc levels:  Disc spaces: Disc desiccation throughout the lumbar spine. Mild disc height loss at L3-4.  T12-L1: No significant disc bulge. No evidence of neural foraminal stenosis. No central canal stenosis.  L1-L2: No significant disc bulge. No evidence of neural foraminal stenosis. No central canal stenosis. Mild bilateral facet arthropathy.  L2-L3: No significant disc bulge. No evidence of neural foraminal stenosis. No central canal stenosis. Mild bilateral facet arthropathy.  L3-L4: Broad-based disc bulge. Severe bilateral facet arthropathy with ligamentum flavum infolding. Severe spinal stenosis. Moderate right foraminal stenosis. No left foraminal stenosis.  L4-L5: Mild broad-based disc bulge flattening the ventral thecal sac. Moderate bilateral facet arthropathy ligamentum flavum infolding. Mild spinal stenosis and bilateral lateral recess stenosis. Mild right foraminal stenosis. No left foraminal stenosis.  L5-S1: Mild broad-based disc bulge. No evidence of neural foraminal stenosis. No central canal stenosis.  IMPRESSION: 1. At L3-4 there is a broad-based disc bulge. Severe bilateral facet arthropathy with ligamentum flavum infolding. Severe spinal stenosis. Moderate right foraminal stenosis. Grade 1 anterolisthesis of L3 on L4 secondary to facet arthropathy. 2. At L4-5 there is a mild broad-based disc bulge flattening the ventral thecal sac. Moderate bilateral facet arthropathy ligamentum flavum infolding. Mild spinal stenosis and bilateral lateral  recess stenosis. Mild right foraminal stenosis.   Electronically Signed   By: Kathreen Devoid   On: 08/24/2017 16:11     Objective:  VS:  HT:    WT:   BMI:     BP:127/60  HR:82bpm  TEMP:98.3 F (36.8 C)(Oral)  RESP:  Physical Exam  Ortho Exam Imaging: No results found.

## 2018-06-09 NOTE — Progress Notes (Signed)
The patient is a 67 year old who is following up after having a left-sided intervention by Dr. Ernestina Patches in terms of an epidural steroid injection.  Did help short-term and her husband said she is at least 75% better overall.  She has not had a physical therapy specifically for her back and I think that may help her the most.  She still points to the sciatic region left side as source of her pain discomfort.  On exam she has a positive straight leg raise the left side but otherwise her exam is normal.  She is mobilizing well.  I do feel its appropriate to send her to outpatient physical therapy and I gave her prescription for [facility].  We will see her back in 4 weeks to see how she is doing overall.  Her husband wants to have her consider another injection at a later date if possible.

## 2018-06-18 ENCOUNTER — Ambulatory Visit
Admission: RE | Admit: 2018-06-18 | Discharge: 2018-06-18 | Disposition: A | Discharge status: Home / Self Care | Payer: Medicare Other | Source: Ambulatory Visit | Attending: Physician Assistant | Admitting: Physician Assistant

## 2018-06-18 DIAGNOSIS — E2839 Other primary ovarian failure: Secondary | ICD-10-CM

## 2018-06-18 DIAGNOSIS — Z780 Asymptomatic menopausal state: Secondary | ICD-10-CM | POA: Diagnosis not present

## 2018-06-18 DIAGNOSIS — Z1231 Encounter for screening mammogram for malignant neoplasm of breast: Secondary | ICD-10-CM

## 2018-06-18 DIAGNOSIS — M8589 Other specified disorders of bone density and structure, multiple sites: Secondary | ICD-10-CM | POA: Diagnosis not present

## 2018-06-24 DIAGNOSIS — M6281 Muscle weakness (generalized): Secondary | ICD-10-CM | POA: Diagnosis not present

## 2018-06-24 DIAGNOSIS — M25652 Stiffness of left hip, not elsewhere classified: Secondary | ICD-10-CM | POA: Diagnosis not present

## 2018-06-24 DIAGNOSIS — M25552 Pain in left hip: Secondary | ICD-10-CM | POA: Diagnosis not present

## 2018-06-26 DIAGNOSIS — M6281 Muscle weakness (generalized): Secondary | ICD-10-CM | POA: Diagnosis not present

## 2018-06-26 DIAGNOSIS — M25652 Stiffness of left hip, not elsewhere classified: Secondary | ICD-10-CM | POA: Diagnosis not present

## 2018-06-26 DIAGNOSIS — M25552 Pain in left hip: Secondary | ICD-10-CM | POA: Diagnosis not present

## 2018-07-01 DIAGNOSIS — M6281 Muscle weakness (generalized): Secondary | ICD-10-CM | POA: Diagnosis not present

## 2018-07-01 DIAGNOSIS — M25552 Pain in left hip: Secondary | ICD-10-CM | POA: Diagnosis not present

## 2018-07-01 DIAGNOSIS — M25652 Stiffness of left hip, not elsewhere classified: Secondary | ICD-10-CM | POA: Diagnosis not present

## 2018-07-07 ENCOUNTER — Ambulatory Visit (INDEPENDENT_AMBULATORY_CARE_PROVIDER_SITE_OTHER): Payer: Medicare Other | Admitting: Orthopaedic Surgery

## 2018-07-21 ENCOUNTER — Telehealth (INDEPENDENT_AMBULATORY_CARE_PROVIDER_SITE_OTHER): Payer: Self-pay | Admitting: Orthopaedic Surgery

## 2018-07-21 ENCOUNTER — Telehealth (INDEPENDENT_AMBULATORY_CARE_PROVIDER_SITE_OTHER): Payer: Self-pay | Admitting: *Deleted

## 2018-07-21 NOTE — Telephone Encounter (Signed)
LMOM for patient letting her know we were returning her call She may call back and leave message

## 2018-07-21 NOTE — Telephone Encounter (Signed)
Ok to repeat last L3 TF esi

## 2018-07-21 NOTE — Telephone Encounter (Signed)
Called pt and lvm #1 

## 2018-07-21 NOTE — Telephone Encounter (Signed)
Pt is scheduled 08/04/18 with driver.

## 2018-07-21 NOTE — Telephone Encounter (Signed)
Patient's husband left a message requesting to speak with someone in regards to the patient.  CB#[phone].  Thank you.

## 2018-07-24 DIAGNOSIS — M9903 Segmental and somatic dysfunction of lumbar region: Secondary | ICD-10-CM | POA: Diagnosis not present

## 2018-07-24 DIAGNOSIS — M9902 Segmental and somatic dysfunction of thoracic region: Secondary | ICD-10-CM | POA: Diagnosis not present

## 2018-07-24 DIAGNOSIS — M5416 Radiculopathy, lumbar region: Secondary | ICD-10-CM | POA: Diagnosis not present

## 2018-07-24 DIAGNOSIS — M62830 Muscle spasm of back: Secondary | ICD-10-CM | POA: Diagnosis not present

## 2018-07-24 DIAGNOSIS — M9905 Segmental and somatic dysfunction of pelvic region: Secondary | ICD-10-CM | POA: Diagnosis not present

## 2018-07-29 DIAGNOSIS — M9903 Segmental and somatic dysfunction of lumbar region: Secondary | ICD-10-CM | POA: Diagnosis not present

## 2018-07-29 DIAGNOSIS — M9902 Segmental and somatic dysfunction of thoracic region: Secondary | ICD-10-CM | POA: Diagnosis not present

## 2018-07-29 DIAGNOSIS — M9905 Segmental and somatic dysfunction of pelvic region: Secondary | ICD-10-CM | POA: Diagnosis not present

## 2018-07-29 DIAGNOSIS — M62830 Muscle spasm of back: Secondary | ICD-10-CM | POA: Diagnosis not present

## 2018-07-29 DIAGNOSIS — M5416 Radiculopathy, lumbar region: Secondary | ICD-10-CM | POA: Diagnosis not present

## 2018-07-31 DIAGNOSIS — M9902 Segmental and somatic dysfunction of thoracic region: Secondary | ICD-10-CM | POA: Diagnosis not present

## 2018-07-31 DIAGNOSIS — M5416 Radiculopathy, lumbar region: Secondary | ICD-10-CM | POA: Diagnosis not present

## 2018-07-31 DIAGNOSIS — M62830 Muscle spasm of back: Secondary | ICD-10-CM | POA: Diagnosis not present

## 2018-07-31 DIAGNOSIS — M9905 Segmental and somatic dysfunction of pelvic region: Secondary | ICD-10-CM | POA: Diagnosis not present

## 2018-07-31 DIAGNOSIS — M9903 Segmental and somatic dysfunction of lumbar region: Secondary | ICD-10-CM | POA: Diagnosis not present

## 2018-08-04 ENCOUNTER — Ambulatory Visit (INDEPENDENT_AMBULATORY_CARE_PROVIDER_SITE_OTHER): Payer: Medicare Other | Admitting: Physical Medicine and Rehabilitation

## 2018-08-04 ENCOUNTER — Ambulatory Visit (INDEPENDENT_AMBULATORY_CARE_PROVIDER_SITE_OTHER): Payer: Self-pay

## 2018-08-04 ENCOUNTER — Encounter (INDEPENDENT_AMBULATORY_CARE_PROVIDER_SITE_OTHER): Payer: Self-pay | Admitting: Physical Medicine and Rehabilitation

## 2018-08-04 VITALS — BP 150/70 | HR 79 | Temp 98.8°F

## 2018-08-04 DIAGNOSIS — M25552 Pain in left hip: Secondary | ICD-10-CM

## 2018-08-04 DIAGNOSIS — M5416 Radiculopathy, lumbar region: Secondary | ICD-10-CM

## 2018-08-04 DIAGNOSIS — M48062 Spinal stenosis, lumbar region with neurogenic claudication: Secondary | ICD-10-CM | POA: Diagnosis not present

## 2018-08-04 MED ORDER — BETAMETHASONE SOD PHOS & ACET 6 (3-3) MG/ML IJ SUSP: 12.0000 mg | Freq: Once | INTRAMUSCULAR | Status: DC

## 2018-08-04 NOTE — Progress Notes (Signed)
.  Numeric Pain Rating Scale and Functional Assessment Average Pain 10   In the last MONTH (on 0-10 scale) has pain interfered with the following?  1. General activity like being  able to carry out your everyday physical activities such as walking, climbing stairs, carrying groceries, or moving a chair?  Rating(9)   +Driver, -BT, -Dye Allergies.  

## 2018-08-05 ENCOUNTER — Other Ambulatory Visit (INDEPENDENT_AMBULATORY_CARE_PROVIDER_SITE_OTHER): Payer: Self-pay | Admitting: Physical Medicine and Rehabilitation

## 2018-08-05 ENCOUNTER — Encounter (INDEPENDENT_AMBULATORY_CARE_PROVIDER_SITE_OTHER): Payer: Self-pay | Admitting: Physical Medicine and Rehabilitation

## 2018-08-05 NOTE — Procedures (Signed)
Lumbosacral Transforaminal Epidural Steroid Injection - Sub-Pedicular Approach with Fluoroscopic Guidance  Patient: Carly Larson      Date of Birth: Aug 20, 1950 MRN: [id number] PCP: Cyndi Bender, PA-C      Visit Date: 08/04/2018   Universal Protocol:    Date/Time: 08/04/2018  Consent Given By: the patient  Position: PRONE  Additional Comments: Vital signs were monitored before and after the procedure. Patient was prepped and draped in the usual sterile fashion. The correct patient, procedure, and site was verified.   Injection Procedure Details:  Procedure Site One Meds Administered:  Meds ordered this encounter  Medications  . betamethasone acetate-betamethasone sodium phosphate (CELESTONE) injection 12 mg    Laterality: Left  Location/Site:  L3-L4  Needle size: 22 G  Needle type: Spinal  Needle Placement: Transforaminal  Findings:    -Comments: Excellent flow of contrast along the nerve and into the epidural space.  Procedure Details: After squaring off the end-plates to get a true AP view, the C-arm was positioned so that an oblique view of the foramen as noted above was visualized. The target area is just inferior to the "nose of the scotty dog" or sub pedicular. The soft tissues overlying this structure were infiltrated with 2-3 ml. of 1% Lidocaine without Epinephrine.  The spinal needle was inserted toward the target using a "trajectory" view along the fluoroscope beam.  Under AP and lateral visualization, the needle was advanced so it did not puncture dura and was located close the 6 O'Clock position of the pedical in AP tracterory. Biplanar projections were used to confirm position. Aspiration was confirmed to be negative for CSF and/or blood. A 1-2 ml. volume of Isovue-250 was injected and flow of contrast was noted at each level. Radiographs were obtained for documentation purposes.   After attaining the desired flow of contrast documented above, a 0.5 to 1.0  ml test dose of 0.25% Marcaine was injected into each respective transforaminal space.  The patient was observed for 90 seconds post injection.  After no sensory deficits were reported, and normal lower extremity motor function was noted,   the above injectate was administered so that equal amounts of the injectate were placed at each foramen (level) into the transforaminal epidural space.   Additional Comments:  The patient tolerated the procedure well Dressing: Band-Aid    Post-procedure details: Patient was observed during the procedure. Post-procedure instructions were reviewed.  Patient left the clinic in stable condition.

## 2018-08-05 NOTE — Patient Instructions (Signed)

## 2018-08-05 NOTE — Progress Notes (Signed)
Carly Larson - 68 y.o. female MRN [id number]  Date of birth: [birth year]  Office Visit Note: Visit Date: 08/04/2018 PCP: Cyndi Bender, PA-C Referred by: Cyndi Bender, PA-C  Subjective: Chief Complaint  Patient presents with  . Left Thigh - Pain   HPI: Carly Larson is a 68 y.o. female who comes in today For evaluation management of chronic worsening low back pain on the left buttock and thigh pain.  We last saw her in November completed left L3 transforaminal epidural steroid injection.  She received 75 to 80% relief for about 6 weeks and then the pain returned to its prior level.  She rates her pain as a 10 out of 10.  This started really back in December 2019 worsening again after the injection.  Standing and walking make the symptoms much worse and sitting and resting makes it better.  Not much helps with the pain when she is up standing.  She is really failed other conservative care.  She has had therapy and medication management without much relief.  Pain is very severe and does limit her daily activities.  She has not noted any focal weakness or foot drop or foot dragging.  She has had no bowel or bladder changes no new trauma.  She denies any right-sided complaints.  MRI shows severe multifactorial stenosis at L3-4 and moderate at L4-5.  She has no listhesis or other alignment difficulties.  She does want to talk to a spine surgeon about decompression.  Case complicated by early dementia and memory loss.  Review of Systems  Constitutional: Negative for chills, fever, malaise/fatigue and weight loss.  HENT: Negative for hearing loss and sinus pain.   Eyes: Negative for blurred vision, double vision and photophobia.  Respiratory: Negative for cough and shortness of breath.   Cardiovascular: Negative for chest pain, palpitations and leg swelling.  Gastrointestinal: Negative for abdominal pain, nausea and vomiting.  Genitourinary: Negative for flank pain.  Musculoskeletal: Positive for  back pain. Negative for myalgias.       Left hip and thigh pain  Skin: Negative for itching and rash.  Neurological: Negative for tremors, focal weakness and weakness.  Endo/Heme/Allergies: Negative.   Psychiatric/Behavioral: Negative for depression.  All other systems reviewed and are negative.  Otherwise per HPI.  Assessment & Plan: Visit Diagnoses:  1. Lumbar radiculopathy   2. Spinal stenosis of lumbar region with neurogenic claudication   3. Pain in left hip     Plan: Findings:  Chronic worsening axial low back pain with referral in the right hip and thigh and a pretty classic L4 distribution.  She has severe stenosis multifactorial at L3-4.  No right-sided complaints and no focal weakness.  Alignment is good otherwise except for the multifactorial stenosis.  She is not had any red flag complaints other than the radicular pain consistent with stenosis and neurogenic claudication.  We are going to place a referral to Dr. Jeneen Rinks neck in our practice for decompression treatment if applicable.  We are going to complete left L3 transforaminal injection today diagnostically hopefully therapeutically.  Prior injection gave her 75% relief for 6 weeks.  I think she would possibly do well with surgical intervention if the injection just not lasting very long.    Meds & Orders:  Meds ordered this encounter  Medications  . betamethasone acetate-betamethasone sodium phosphate (CELESTONE) injection 12 mg    Orders Placed This Encounter  Procedures  . XR C-ARM NO REPORT  . Ambulatory referral to  Orthopedic Surgery  . Epidural Steroid injection    Follow-up: No follow-ups on file.   Procedures: No procedures performed  Lumbosacral Transforaminal Epidural Steroid Injection - Sub-Pedicular Approach with Fluoroscopic Guidance  Patient: Carly Larson      Date of Birth: 1951/05/08 MRN: [id number] PCP: Cyndi Bender, PA-C      Visit Date: 08/04/2018   Universal Protocol:    Date/Time:  08/04/2018  Consent Given By: the patient  Position: PRONE  Additional Comments: Vital signs were monitored before and after the procedure. Patient was prepped and draped in the usual sterile fashion. The correct patient, procedure, and site was verified.   Injection Procedure Details:  Procedure Site One Meds Administered:  Meds ordered this encounter  Medications  . betamethasone acetate-betamethasone sodium phosphate (CELESTONE) injection 12 mg    Laterality: Left  Location/Site:  L3-L4  Needle size: 22 G  Needle type: Spinal  Needle Placement: Transforaminal  Findings:    -Comments: Excellent flow of contrast along the nerve and into the epidural space.  Procedure Details: After squaring off the end-plates to get a true AP view, the C-arm was positioned so that an oblique view of the foramen as noted above was visualized. The target area is just inferior to the "nose of the scotty dog" or sub pedicular. The soft tissues overlying this structure were infiltrated with 2-3 ml. of 1% Lidocaine without Epinephrine.  The spinal needle was inserted toward the target using a "trajectory" view along the fluoroscope beam.  Under AP and lateral visualization, the needle was advanced so it did not puncture dura and was located close the 6 O'Clock position of the pedical in AP tracterory. Biplanar projections were used to confirm position. Aspiration was confirmed to be negative for CSF and/or blood. A 1-2 ml. volume of Isovue-250 was injected and flow of contrast was noted at each level. Radiographs were obtained for documentation purposes.   After attaining the desired flow of contrast documented above, a 0.5 to 1.0 ml test dose of 0.25% Marcaine was injected into each respective transforaminal space.  The patient was observed for 90 seconds post injection.  After no sensory deficits were reported, and normal lower extremity motor function was noted,   the above injectate was  administered so that equal amounts of the injectate were placed at each foramen (level) into the transforaminal epidural space.   Additional Comments:  The patient tolerated the procedure well Dressing: Band-Aid    Post-procedure details: Patient was observed during the procedure. Post-procedure instructions were reviewed.  Patient left the clinic in stable condition.     Clinical History: MRI LUMBAR SPINE WITHOUT CONTRAST  TECHNIQUE: Multiplanar, multisequence MR imaging of the lumbar spine was performed. No intravenous contrast was administered.  COMPARISON:  None.  FINDINGS: Segmentation:  Standard.  Alignment: 4 mm anterolisthesis of L3 on L4 secondary to facet disease.  Vertebrae:  No fracture, evidence of discitis, or bone lesion.  Conus medullaris and cauda equina: Conus extends to the T12 level. Conus and cauda equina appear normal.  Paraspinal and other soft tissues: No focal paraspinal abnormality.  Disc levels:  Disc spaces: Disc desiccation throughout the lumbar spine. Mild disc height loss at L3-4.  T12-L1: No significant disc bulge. No evidence of neural foraminal stenosis. No central canal stenosis.  L1-L2: No significant disc bulge. No evidence of neural foraminal stenosis. No central canal stenosis. Mild bilateral facet arthropathy.  L2-L3: No significant disc bulge. No evidence of neural foraminal stenosis. No central  canal stenosis. Mild bilateral facet arthropathy.  L3-L4: Broad-based disc bulge. Severe bilateral facet arthropathy with ligamentum flavum infolding. Severe spinal stenosis. Moderate right foraminal stenosis. No left foraminal stenosis.  L4-L5: Mild broad-based disc bulge flattening the ventral thecal sac. Moderate bilateral facet arthropathy ligamentum flavum infolding. Mild spinal stenosis and bilateral lateral recess stenosis. Mild right foraminal stenosis. No left foraminal stenosis.  L5-S1: Mild  broad-based disc bulge. No evidence of neural foraminal stenosis. No central canal stenosis.  IMPRESSION: 1. At L3-4 there is a broad-based disc bulge. Severe bilateral facet arthropathy with ligamentum flavum infolding. Severe spinal stenosis. Moderate right foraminal stenosis. Grade 1 anterolisthesis of L3 on L4 secondary to facet arthropathy. 2. At L4-5 there is a mild broad-based disc bulge flattening the ventral thecal sac. Moderate bilateral facet arthropathy ligamentum flavum infolding. Mild spinal stenosis and bilateral lateral recess stenosis. Mild right foraminal stenosis.   Electronically Signed   By: Kathreen Devoid   On: 08/24/2017 16:11   She reports that she has never smoked. She has never used smokeless tobacco. No results for input(s): HGBA1C, LABURIC in the last 8760 hours.  Objective:  VS:  HT:    WT:   BMI:     BP:(!) 150/70  HR:79bpm  TEMP:98.8 F (37.1 C)(Oral)  RESP:  Physical Exam Vitals signs and nursing note reviewed.  Constitutional:      General: She is not in acute distress.    Appearance: Normal appearance. She is well-developed. She is not ill-appearing.  HENT:     Head: Normocephalic and atraumatic.  Eyes:     Conjunctiva/sclera: Conjunctivae normal.     Pupils: Pupils are equal, round, and reactive to light.  Cardiovascular:     Rate and Rhythm: Normal rate.     Pulses: Normal pulses.  Pulmonary:     Effort: Pulmonary effort is normal.  Musculoskeletal:     Right lower leg: No edema.     Left lower leg: No edema.     Comments: Patient ambulates without aid with an antalgic gait to the left.  No pain with hip rotation and no pain over the greater trochanters and good distal strength.  She does stand with a forward flexed lumbar spine has pain with extension and facet loading.  Skin:    General: Skin is warm and dry.     Findings: No erythema or rash.  Neurological:     General: No focal deficit present.     Mental Status: She is  alert and oriented to person, place, and time.     Sensory: No sensory deficit.     Motor: No abnormal muscle tone.     Coordination: Coordination normal.     Gait: Gait abnormal.  Psychiatric:        Mood and Affect: Mood normal.        Behavior: Behavior normal.     Ortho Exam Imaging: Xr C-arm No Report  Result Date: 08/04/2018 Please see Notes tab for imaging impression.   Past Medical/Family/Surgical/Social History: Medications & Allergies reviewed per EMR, new medications updated. Patient Active Problem List   Diagnosis Date Noted  . Dementia ([place]) 12/23/2017  . Syncope 12/23/2017  . Heart murmur 12/23/2017  . Unilateral primary osteoarthritis, right knee 10/23/2017  . Unilateral primary osteoarthritis, left knee 09/23/2017  . Chronic pain of left knee 08/08/2017  . Chronic pain of right knee 08/08/2017  . Sciatica, left side 08/08/2017  . Memory loss 12/03/2013   Past Medical History:  Diagnosis  Date  . Anxiety state, unspecified   . Carpal tunnel syndrome   . Carpal tunnel syndrome, bilateral   . Chiari malformation type I ([place])   . Disorder of bone and cartilage, unspecified   . Encounter for long-term (current) use of other medications   . Esophageal reflux   . Headache   . High risk medication use   . Insomnia   . Memory loss    MMSE 24/30 on 12/05/11, 30/30 on 10/25/13  . Memory loss   . Menopausal symptoms   . Osteopenia    Hx of vitamin D deficiency  . Osteopenia   . Symptomatic menopausal or female climacteric states   . White matter abnormality on MRI of brain    Family History  Problem Relation Age of Onset  . Diabetes Mother   . Osteoarthritis Mother   . Hypertension Mother   . Dementia Mother   . Stroke Father   . Heart attack Brother   . CAD Brother   . Breast cancer Maternal Aunt    Past Surgical History:  Procedure Laterality Date  . APPENDECTOMY    . CATARACT EXTRACTION, BILATERAL     Social History   Occupational History  .  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 Smoking status: Never Smoker  . Smokeless tobacco: Never Used  Substance and Sexual Activity  . Alcohol use: No    Alcohol/week: 0.0 standard drinks  . Drug use: No  . Sexual activity: Not on file

## 2018-08-12 ENCOUNTER — Ambulatory Visit (INDEPENDENT_AMBULATORY_CARE_PROVIDER_SITE_OTHER): Payer: Self-pay

## 2018-08-12 ENCOUNTER — Ambulatory Visit (INDEPENDENT_AMBULATORY_CARE_PROVIDER_SITE_OTHER): Payer: Medicare Other | Admitting: Specialist

## 2018-08-12 ENCOUNTER — Encounter (INDEPENDENT_AMBULATORY_CARE_PROVIDER_SITE_OTHER): Payer: Self-pay | Admitting: Specialist

## 2018-08-12 VITALS — BP 147/73 | HR 69 | Ht 60.0 in | Wt 120.0 lb

## 2018-08-12 DIAGNOSIS — M4316 Spondylolisthesis, lumbar region: Secondary | ICD-10-CM

## 2018-08-12 DIAGNOSIS — M48062 Spinal stenosis, lumbar region with neurogenic claudication: Secondary | ICD-10-CM

## 2018-08-12 DIAGNOSIS — M4807 Spinal stenosis, lumbosacral region: Secondary | ICD-10-CM

## 2018-08-12 MED ORDER — TRAMADOL HCL 50 MG PO TABS: 50.0000 mg | ORAL_TABLET | Freq: Four times a day (QID) | ORAL | 0 refills | Status: DC | PRN

## 2018-08-12 NOTE — Patient Instructions (Signed)
Avoid bending, stooping and avoid lifting weights greater than 10 lbs. Avoid prolong standing and walking. Order for a new walker with wheels. Surgery scheduling secretary Kandice Hams, will call you in the next week to schedule for surgery.  Surgery recommended is a one level lumbar fusion L3-4 and decompression L3-4, L4-5 this would be done with rods, screws and cages with local bone graft and allograft (donor bone graft). Take hydrocodone for for pain. Risk of surgery includes risk of infection 1 in 300 patients, bleeding 1/2% chance you would need a transfusion.   Risk to the nerves is one in 10,000. You will need to use a brace for 3 months and wean from the brace on the 4th month. Expect improved walking and standing tolerance. Expect relief of leg pain but numbness may persist depending on the length and degree of pressure that has been present. Start gabapentin 600 mg at night.  Celebrex 200mg  po BID

## 2018-08-12 NOTE — Progress Notes (Signed)
Office Visit Note   Patient: Carly Larson           Date of Birth: Jul 27, 1950           MRN: [id number] Visit Date: 08/12/2018              Requested by: Cyndi Bender, PA-C [id number] [place], [place] [address] PCP: Cyndi Bender, PA-C   Assessment & Plan: Visit Diagnoses:  1. Spinal stenosis of lumbosacral region   2. Spondylolisthesis, lumbar region     Plan: Avoid bending, stooping and avoid lifting weights greater than 10 lbs. Avoid prolong standing and walking. Order for a new walker with wheels. Surgery scheduling secretary Kandice Hams, will call you in the next week to schedule for surgery.  Surgery recommended is a one level lumbar fusion L3-4 and decompression L3-4, L4-5 this would be done with rods, screws and cages with local bone graft and allograft (donor bone graft). Take hydrocodone for for pain. Risk of surgery includes risk of infection 1 in 300 patients, bleeding 1/2% chance you would need a transfusion.   Risk to the nerves is one in 10,000. You will need to use a brace for 3 months and wean from the brace on the 4th month. Expect improved walking and standing tolerance. Expect relief of leg pain but numbness may persist depending on the length and degree of pressure that has been present. Start gabapentin 600 mg at night.  Celebrex 200mg  po BID  Follow-Up Instructions: Return in about 4 weeks (around 09/09/2018).   Orders:  Orders Placed This Encounter  Procedures  . XR Lumbar Spine 2-3 Views   Meds ordered this encounter  Medications  . traMADol (ULTRAM) 50 MG tablet    Sig: Take 1 tablet (50 mg total) by mouth every 6 (six) hours as needed for up to 7 days.    Dispense:  28 tablet    Refill:  0      Procedures: No procedures performed   Clinical Data: No additional findings.   Subjective: Chief Complaint  Patient presents with  . Lower Back - Pain    68 year old female with long history of back and left buttock pain. The pain  is worse with sitting, bending sometimes, can't lift anything of weight. Riding in the car is painful especially when it is like it is now. She has been experiencing worsened pain the last 6 months. It just keeps on getting worse. No bowel or bladder difficulties. Can't walk a mile or even 50 yards due to pain. She goes to the [facility] in the am and swims or floats and not able to use a stationary bike because the knees hurt too much. She has been to PT and to the DC without relief.  Has undergone several injections, 2 by Dr. Ernestina Patches, Dr. Ninfa Linden injected the knee and the hip. She has pain at night and is having to take a sleeping pill and that helps. No weight loss or gain. Unable to walk for exercise for a year or more now. Pain is left buttock into the left posterior thigh. Cough or sneeze unless sitting she feel pain into the left leg. Unable to climb stairs, can reach her shoes and socks. She is very flexible, can do PT in  Her sleep.    Review of Systems  Constitutional: Negative for activity change, appetite change, chills, diaphoresis, fatigue, fever and unexpected weight change.  HENT: Positive for congestion and rhinorrhea. Negative for dental  problem, drooling, ear discharge, ear pain, postnasal drip and sinus pressure.   Eyes: Negative for photophobia, pain, discharge, redness, itching and visual disturbance.  Respiratory: Negative for apnea, cough, choking, chest tightness, shortness of breath, wheezing and stridor.   Cardiovascular: Negative for chest pain, palpitations and leg swelling.  Gastrointestinal: Negative for abdominal distention, abdominal pain, anal bleeding, blood in stool, constipation, diarrhea, nausea, rectal pain and vomiting.  Genitourinary: Negative for difficulty urinating, dyspareunia, dysuria, enuresis and flank pain.  Musculoskeletal: Positive for back pain and myalgias. Negative for arthralgias, gait problem, joint swelling, neck pain and neck stiffness.  Skin:  Negative for color change, pallor, rash and wound.  Allergic/Immunologic: Negative for environmental allergies, food allergies and immunocompromised state.  Neurological: Positive for weakness and numbness. Negative for dizziness, tremors, seizures, syncope, facial asymmetry, speech difficulty, light-headedness and headaches.  Hematological: Negative for adenopathy. Does not bruise/bleed easily.  Psychiatric/Behavioral: Negative for agitation, behavioral problems, confusion, decreased concentration, dysphoric mood, hallucinations, self-injury, sleep disturbance and suicidal ideas. The patient is not nervous/anxious and is not hyperactive.      Objective: Vital Signs: BP (!) 147/73 (BP Location: Left Arm, Patient Position: Sitting)   Pulse 69   Ht 5' (1.524 m)   Wt 120 lb (54.4 kg)   BMI 23.44 kg/m   Physical Exam Constitutional:      Appearance: She is well-developed.  HENT:     Head: Normocephalic and atraumatic.  Eyes:     Pupils: Pupils are equal, round, and reactive to light.  Neck:     Musculoskeletal: Normal range of motion and neck supple.  Pulmonary:     Effort: Pulmonary effort is normal.     Breath sounds: Normal breath sounds.  Abdominal:     General: Bowel sounds are normal.     Palpations: Abdomen is soft.  Musculoskeletal: Normal range of motion.  Skin:    General: Skin is warm and dry.  Neurological:     Mental Status: She is alert and oriented to person, place, and time.  Psychiatric:        Behavior: Behavior normal.        Thought Content: Thought content normal.        Judgment: Judgment normal.     Ortho Exam  Specialty Comments:  No specialty comments available.  Imaging: No results found.   PMFS History: Patient Active Problem List   Diagnosis Date Noted  . Dementia ([place]) 12/23/2017  . Syncope 12/23/2017  . Heart murmur 12/23/2017  . Unilateral primary osteoarthritis, right knee 10/23/2017  . Unilateral primary osteoarthritis, left knee  09/23/2017  . Chronic pain of left knee 08/08/2017  . Chronic pain of right knee 08/08/2017  . Sciatica, left side 08/08/2017  . Memory loss 12/03/2013   Past Medical History:  Diagnosis Date  . Anxiety state, unspecified   . Carpal tunnel syndrome   . Carpal tunnel syndrome, bilateral   . Chiari malformation type I ([place])   . Disorder of bone and cartilage, unspecified   . Encounter for long-term (current) use of other medications   . Esophageal reflux   . Headache   . High risk medication use   . Insomnia   . Memory loss    MMSE 24/30 on 12/05/11, 30/30 on 10/25/13  . Memory loss   . Menopausal symptoms   . Osteopenia    Hx of vitamin D deficiency  . Osteopenia   . Symptomatic menopausal or female climacteric states   . White matter abnormality  on MRI of brain     Family History  Problem Relation Age of Onset  . Diabetes Mother   . Osteoarthritis Mother   . Hypertension Mother   . Dementia Mother   . Stroke Father   . Heart attack Brother   . CAD Brother   . Breast cancer Maternal Aunt     Past Surgical History:  Procedure Laterality Date  . APPENDECTOMY    . CATARACT EXTRACTION, BILATERAL     Social History   Occupational History  . 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 Smoking status: Never Smoker  . Smokeless tobacco: Never Used  Substance and Sexual Activity  . Alcohol use: No    Alcohol/week: 0.0 standard drinks  . Drug use: No  . Sexual activity: Not on file

## 2018-08-20 DIAGNOSIS — F0390 Unspecified dementia without behavioral disturbance: Secondary | ICD-10-CM | POA: Diagnosis not present

## 2018-08-20 DIAGNOSIS — Z01818 Encounter for other preprocedural examination: Secondary | ICD-10-CM | POA: Diagnosis not present

## 2018-08-20 DIAGNOSIS — M48061 Spinal stenosis, lumbar region without neurogenic claudication: Secondary | ICD-10-CM | POA: Diagnosis not present

## 2018-08-20 DIAGNOSIS — M8580 Other specified disorders of bone density and structure, unspecified site: Secondary | ICD-10-CM | POA: Diagnosis not present

## 2018-08-25 ENCOUNTER — Telehealth (INDEPENDENT_AMBULATORY_CARE_PROVIDER_SITE_OTHER): Payer: Self-pay | Admitting: Specialist

## 2018-08-25 NOTE — Telephone Encounter (Signed)
Patient's husband called to get the patient's surgery schedule with Dr. Louanne Skye.  Please advise.  CB#[phone]

## 2018-08-26 NOTE — Telephone Encounter (Signed)
I called number listed and no answer.  I called home number and no answer/mailbox is full.

## 2018-08-28 NOTE — Telephone Encounter (Signed)
Scheduled

## 2018-09-02 NOTE — Pre-Procedure Instructions (Addendum)
Carly Larson  09/02/2018     Your procedure is scheduled on Tuesday, February 18  Report to [facility] Admitting at 5:30 AM                 Your surgery or procedure is scheduled for  7:30 AM   Call this number if you have problems the morning of surgery: [phone]  This is the number for the Pre- Surgical Desk.                 For any other questions, please call [phone], Monday - Friday 8 AM - 4 PM.   Remember:  Do not eat or drink after midnight Monday ,February 17.    Take these medicines the morning of surgery with A SIP OF WATER: Gabapentin Memantine (Namenda) Claritin Venlaflexine  Take if needed: Hydrocodone- Acetaminophen or Tramadol    STOP taking Aspirin, Aspirin Products (Goody Powder, Excedrin Migraine), Ibuprofen (Advil), Naproxen (Aleve), Meloxicam Vitamins and Herbal Products (ie Fish Oil). Follow your surgeon's instructions regarding holding or continuing Aspirin.   Special instructions:   [place]- Preparing For Surgery  Before surgery, you can play an important role. Because skin is not sterile, your skin needs to be as free of germs as possible. You can reduce the number of germs on your skin by washing with CHG (chlorahexidine gluconate) Soap before surgery.  CHG is an antiseptic cleaner which kills germs and bonds with the skin to continue killing germs even after washing.    Oral Hygiene is also important to reduce your risk of infection.  Remember - BRUSH YOUR TEETH THE MORNING OF SURGERY WITH YOUR REGULAR TOOTHPASTE  Please do not use if you have an allergy to CHG or antibacterial soaps. If your skin becomes reddened/irritated stop using the CHG.  Do not shave (including legs and underarms) for at least 48 hours prior to first CHG shower. It is OK to shave your face.  Please follow these instructions carefully.   1. Shower the NIGHT BEFORE SURGERY and the MORNING OF SURGERY with CHG.   2. If you chose to wash your hair, wash  your hair first as usual with your normal shampoo.  3. After you shampoo, wash your face and private area with the soap you use at home, then rinse your hair and body thoroughly to remove the shampoo and soap.   4. Use CHG as you would any other liquid soap. You can apply CHG directly to the skin and wash gently with a scrungie or a clean washcloth.   5. Apply the CHG Soap to your body ONLY FROM THE NECK DOWN.  Do not use on open wounds or open sores. Avoid contact with your eyes, ears, mouth and genitals (private parts).  6. Wash thoroughly, paying special attention to the area where your surgery will be performed.  7. Thoroughly rinse your body with warm water from the neck down.  8. DO NOT shower/wash with your normal soap after using and rinsing off the CHG Soap.  9. Pat yourself dry with a CLEAN TOWEL.  10. Wear CLEAN PAJAMAS to bed the night before surgery, wear comfortable clothes the morning of surgery  11. Place CLEAN SHEETS on your bed the night of your first shower and DO NOT SLEEP WITH PETS.  Day of Surgery: Shower as written above  Do not wear lotions, powders, or perfumes, or deodorant. Please wear clean clothes to the hospital/surgery center.   Remember  to brush your teeth WITH YOUR REGULAR TOOTHPASTE.  Do not wear jewelry, make-up or nail polish.  Do not shave 48 hours prior to surgery.  Men may shave face and neck.  Do not bring valuables to the hospital.  [facility] is not responsible for any belongings or valuables.  Contacts, dentures or bridgework may not be worn into surgery.  Leave your suitcase in the car.  After surgery it may be brought to your room.  For patients admitted to the hospital, discharge time will be determined by your treatment team.  Patients discharged the day of surgery will not be allowed to drive home.    Please read over the following fact sheets that you were given:  Managing Pain, Surgical Site Infection, Coughing and Deep  Breathing

## 2018-09-03 ENCOUNTER — Ambulatory Visit (INDEPENDENT_AMBULATORY_CARE_PROVIDER_SITE_OTHER): Payer: Medicare Other | Admitting: Orthopaedic Surgery

## 2018-09-03 NOTE — Pre-Procedure Instructions (Signed)
Carly Larson  09/03/2018      [facility] [place], [place] - [address] AT [address] [place] [address] Phone: [phone] Fax: [phone]    Your procedure is scheduled on Feb.18  Report to [facility] Entrance A  at 5:30 A.M.  Call this number if you have problems the morning of surgery:  [phone]   Remember:  Do not eat or drink after midnight.      Take these medicines the morning of surgery with A SIP OF WATER :              Gabapentin (neurontin)             memantine (namenda)             Methocarbamol (robaxin) if needed             Mirtazapine (remeron)             Tramadol if needed              Venlafaxine (effexor)             7 days prior to surgery STOP taking any Aspirin (unless otherwise instructed by your surgeon), Aleve, Naproxen, Ibuprofen, Motrin, Advil, Goody's, BC's, all herbal medications, fish oil, and all vitamins, including meloxicam/mobic.                 Follow your surgeon's instructions on when to stop Asprin.  If no instructions were given by your surgeon then you will need to call the office to get those instructions.      Do not wear jewelry, make-up or nail polish.  Do not wear lotions, powders, or perfumes, or deodorant.  Do not shave 48 hours prior to surgery.  Men may shave face and neck.  Do not bring valuables to the hospital.  [facility] is not responsible for any belongings or valuables.  Contacts, dentures or bridgework may not be worn into surgery.  Leave your suitcase in the car.  After surgery it may be brought to your room.  For patients admitted to the hospital, discharge time will be determined by your treatment team.  Patients discharged the day of surgery will not be allowed to drive home.    Special instructions:   [place]- Preparing For Surgery  Before surgery, you can play an important role. Because skin is not sterile, your skin needs to be as free  of germs as possible. You can reduce the number of germs on your skin by washing with CHG (chlorahexidine gluconate) Soap before surgery.  CHG is an antiseptic cleaner which kills germs and bonds with the skin to continue killing germs even after washing.    Oral Hygiene is also important to reduce your risk of infection.  Remember - BRUSH YOUR TEETH THE MORNING OF SURGERY WITH YOUR REGULAR TOOTHPASTE  Please do not use if you have an allergy to CHG or antibacterial soaps. If your skin becomes reddened/irritated stop using the CHG.  Do not shave (including legs and underarms) for at least 48 hours prior to first CHG shower. It is OK to shave your face.  Please follow these instructions carefully.   1. Shower the NIGHT BEFORE SURGERY and the MORNING OF SURGERY with CHG.   2. If you chose to wash your hair, wash your hair first as usual with your normal shampoo.  3. After you shampoo, rinse  your hair and body thoroughly to remove the shampoo.  4. Use CHG as you would any other liquid soap. You can apply CHG directly to the skin and wash gently with a scrungie or a clean washcloth.   5. Apply the CHG Soap to your body ONLY FROM THE NECK DOWN.  Do not use on open wounds or open sores. Avoid contact with your eyes, ears, mouth and genitals (private parts). Wash Face and genitals (private parts)  with your normal soap.  6. Wash thoroughly, paying special attention to the area where your surgery will be performed.  7. Thoroughly rinse your body with warm water from the neck down.  8. DO NOT shower/wash with your normal soap after using and rinsing off the CHG Soap.  9. Pat yourself dry with a CLEAN TOWEL.  10. Wear CLEAN PAJAMAS to bed the night before surgery, wear comfortable clothes the morning of surgery  11. Place CLEAN SHEETS on your bed the night of your first shower and DO NOT SLEEP WITH PETS.    Day of Surgery:  Do not apply any deodorants/lotions.  Please wear clean clothes to  the hospital/surgery center.   Remember to brush your teeth WITH YOUR REGULAR TOOTHPASTE.    Please read over the following fact sheets that you were given. Coughing and Deep Breathing, MRSA Information and Surgical Site Infection Prevention

## 2018-09-04 ENCOUNTER — Encounter (HOSPITAL_COMMUNITY): Payer: Self-pay

## 2018-09-04 ENCOUNTER — Encounter (HOSPITAL_COMMUNITY)
Admission: RE | Admit: 2018-09-04 | Discharge: 2018-09-04 | Disposition: A | Discharge status: Home / Self Care | Payer: Medicare Other | Source: Ambulatory Visit | Attending: Specialist | Admitting: Specialist

## 2018-09-04 ENCOUNTER — Encounter (INDEPENDENT_AMBULATORY_CARE_PROVIDER_SITE_OTHER): Payer: Self-pay | Admitting: Surgery

## 2018-09-04 ENCOUNTER — Ambulatory Visit (HOSPITAL_COMMUNITY)
Admission: RE | Admit: 2018-09-04 | Discharge: 2018-09-04 | Disposition: A | Discharge status: Home / Self Care | Payer: Medicare Other | Source: Ambulatory Visit | Attending: Surgery | Admitting: Surgery

## 2018-09-04 ENCOUNTER — Other Ambulatory Visit: Payer: Self-pay

## 2018-09-04 ENCOUNTER — Ambulatory Visit (INDEPENDENT_AMBULATORY_CARE_PROVIDER_SITE_OTHER): Payer: Medicare Other | Admitting: Surgery

## 2018-09-04 ENCOUNTER — Telehealth (INDEPENDENT_AMBULATORY_CARE_PROVIDER_SITE_OTHER): Payer: Self-pay | Admitting: Surgery

## 2018-09-04 VITALS — BP 138/65 | HR 65 | Temp 98.5°F | Ht 60.0 in | Wt 130.0 lb

## 2018-09-04 DIAGNOSIS — Z01818 Encounter for other preprocedural examination: Secondary | ICD-10-CM

## 2018-09-04 DIAGNOSIS — M48062 Spinal stenosis, lumbar region with neurogenic claudication: Secondary | ICD-10-CM

## 2018-09-04 DIAGNOSIS — M4316 Spondylolisthesis, lumbar region: Secondary | ICD-10-CM

## 2018-09-04 HISTORY — DX: Depression, unspecified: F32.A

## 2018-09-04 HISTORY — DX: Unspecified osteoarthritis, unspecified site: M19.90

## 2018-09-04 HISTORY — DX: Major depressive disorder, single episode, unspecified: F32.9

## 2018-09-04 LAB — URINALYSIS, ROUTINE W REFLEX MICROSCOPIC
Bilirubin Urine: NEGATIVE
Glucose, UA: NEGATIVE mg/dL
Hgb urine dipstick: NEGATIVE
Ketones, ur: NEGATIVE mg/dL
Leukocytes,Ua: NEGATIVE
NITRITE: NEGATIVE
Protein, ur: NEGATIVE mg/dL
Specific Gravity, Urine: 1.006 (ref 1.005–1.030)
pH: 5 (ref 5.0–8.0)

## 2018-09-04 LAB — CBC
HEMATOCRIT: 44.1 % (ref 36.0–46.0)
Hemoglobin: 13.4 g/dL (ref 12.0–15.0)
MCH: 30.8 pg (ref 26.0–34.0)
MCHC: 30.4 g/dL (ref 30.0–36.0)
MCV: 101.4 fL — AB (ref 80.0–100.0)
PLATELETS: 282 10*3/uL (ref 150–400)
RBC: 4.35 MIL/uL (ref 3.87–5.11)
RDW: 13.2 % (ref 11.5–15.5)
WBC: 9.1 10*3/uL (ref 4.0–10.5)
nRBC: 0 % (ref 0.0–0.2)

## 2018-09-04 LAB — TYPE AND SCREEN
ABO/RH(D): A POS
ANTIBODY SCREEN: NEGATIVE

## 2018-09-04 LAB — COMPREHENSIVE METABOLIC PANEL
ALT: 24 U/L (ref 0–44)
AST: 47 U/L — ABNORMAL HIGH (ref 15–41)
Albumin: 4 g/dL (ref 3.5–5.0)
Alkaline Phosphatase: 77 U/L (ref 38–126)
Anion gap: 8 (ref 5–15)
BUN: 8 mg/dL (ref 8–23)
CO2: 27 mmol/L (ref 22–32)
Calcium: 9.1 mg/dL (ref 8.9–10.3)
Chloride: 104 mmol/L (ref 98–111)
Creatinine, Ser: 1 mg/dL (ref 0.44–1.00)
GFR calc Af Amer: >60 mL/min (ref >60)
GFR, EST NON AFRICAN AMERICAN: 58 mL/min — AB (ref >60)
Glucose, Bld: 76 mg/dL (ref 70–99)
Potassium: 4.4 mmol/L (ref 3.5–5.1)
Sodium: 139 mmol/L (ref 135–145)
Total Bilirubin: 0.5 mg/dL (ref 0.3–1.2)
Total Protein: 6.4 g/dL — ABNORMAL LOW (ref 6.5–8.1)

## 2018-09-04 LAB — SURGICAL PCR SCREEN
MRSA, PCR: NEGATIVE
Staphylococcus aureus: NEGATIVE

## 2018-09-04 LAB — ABO/RH: ABO/RH(D): A POS

## 2018-09-04 NOTE — Progress Notes (Signed)
PCP - Cyndi Bender, PA- C, [facility] , [place].  I requested records.  Cardiologist - Dr Agustin Cree- saw 6//2020 For syncopal episodes.  Chest x-ray - 09/04/2018  EKG - 12/25/2018  Stress Test - no  ECHO - 11/2017  Cardiac Cath - no    Sleep Study - no CPAP - no  LABS- CBC, CMP, UA  ASA-no HA1C-naFasting  Blood Sugar - na Checks Blood Sugar 0 times a day  Anesthesia- patient's husband reports that patient has not had any further syncopal episodes  Pt denies having chest pain, sob, or fever at this time. All instructions explained to the pt, with a verbal understanding of the material. Pt agrees to go over the instructions while at home for a better understanding. The opportunity to ask questions was provided.

## 2018-09-04 NOTE — Telephone Encounter (Signed)
Pt called saying she is still at her pre opt at the hospital so she will be running late for her apt with Benjiman Core

## 2018-09-05 ENCOUNTER — Encounter (INDEPENDENT_AMBULATORY_CARE_PROVIDER_SITE_OTHER): Payer: Self-pay | Admitting: Surgery

## 2018-09-05 NOTE — H&P (View-Only) (Signed)
68 year old female with history of low back pain, lower extremity radiculopathy.  States that symptoms unchanged from previous visit.  She is wanting to proceed with LEFT TRANSFORAMINAL LUMBAR INTERBODY FUSION L3-4 WITH DEPUY MPACT PEDICLE SCREWS AND RODS AND CAGE L3-4, DECOMPRESSION/CENTRAL LAMINECTOMY L4-5 as scheduled.  Surgical procedure discussed in detail along with what is to be expected postop.  Patient will be needing short skilled nursing facility placement for rehab postop and would like to go to [facility] in [place].

## 2018-09-05 NOTE — Progress Notes (Signed)
Anesthesia Chart Review:  Case:  [id number] Date/Time:  09/09/18 0715   Procedure:  LEFT TRANSFORAMINAL LUMBAR INTERBODY FUSION L3-4 WITH DEPUY MPACT PEDICLE SCREWS AND RODS AND CAGE L3-4, DECOMPRESSION/CENTRAL LAMINECTOMY L4-5 (N/A )   Anesthesia type:  General   Pre-op diagnosis:  spondylolisthesis L3-4, lumbar spinal stenosis L4-5, left L4 radiculopathy   Location:  [facility] ROOM 05 / [facility] OR   Surgeon:  Jessy Oto, MD      DISCUSSION: 68 yo female never smoker. Pertinent hx includes GERD, Anxiety, HA, Depression, Chiari malformation, Dementia.  Pt was seen 12/2017 by Dr. Agustin Cree to evaluate episodes of syncope. She had an echo that showed normal LV function with EF 55-60%, mild AR, mild MR. She had an event monitor that showed sinus rhythm. She was diagnosed with vasovagal syncope. She was advised to stay well hydrated.  She has medical and cardiac clearance from PCP on chart stating pt is "average" surgical risk.    Anticipate she can proceed as planned barring acute status change.   VS: BP (!) 125/59   Pulse 73   Temp 36.8 C   Resp 18   Ht 5' (1.524 m)   Wt 59.5 kg   SpO2 98%   BMI 25.60 kg/m   PROVIDERS: Cyndi Bender, PA-C is PCP  Jenne Campus, MD is Cardiologist  LABS: Labs reviewed: Acceptable for surgery. (all labs ordered are listed, but only abnormal results are displayed)  Labs Reviewed  CBC - Abnormal; Notable for the following components:      Result Value   MCV 101.4 (*)    All other components within normal limits  COMPREHENSIVE METABOLIC PANEL - Abnormal; Notable for the following components:   Total Protein 6.4 (*)    AST 47 (*)    GFR calc non Af Amer 58 (*)    All other components within normal limits  URINALYSIS, ROUTINE W REFLEX MICROSCOPIC - Abnormal; Notable for the following components:   Color, Urine AMBER (*)    APPearance HAZY (*)    All other components within normal limits  SURGICAL PCR SCREEN  TYPE AND SCREEN  ABO/RH      IMAGES: CHEST - 2 VIEW 08/25/2018  COMPARISON:  None.  FINDINGS: The heart size and mediastinal contours are within normal limits. Both lungs are clear. The visualized skeletal structures are unremarkable.  IMPRESSION: No active cardiopulmonary disease.  EKG: 12/24/2017: NSR. Rate 67.  CV: Event monitor 01/09/2018: Baseline rhythm: Sinus rhythm  Atrial arrhythmia: None  Ventricular arrhythmia: None  Conduction abnormality: None  Symptoms: None  TTE 01/09/2018: Study Conclusions  - Left ventricle: The cavity size was normal. Systolic function was   normal. The estimated ejection fraction was in the range of 55%   to 60%. Wall motion was normal; there were no regional wall   motion abnormalities. The study is not technically sufficient to   allow evaluation of LV diastolic function. - Aortic valve: There was mild regurgitation. - Mitral valve: There was mild regurgitation.  Impressions:  - Normal LVEF.   Mild AR.   Mild MR.    Past Medical History:  Diagnosis Date  . Anxiety state, unspecified   . Arthritis   . Carpal tunnel syndrome    left  . Chiari malformation type I ([place])   . Depression   . Disorder of bone and cartilage, unspecified   . Encounter for long-term (current) use of other medications   . Esophageal reflux   . Headache   .  High risk medication use   . Insomnia   . Memory loss    MMSE 24/30 on 12/05/11, 30/30 on 10/25/13  . Memory loss   . Menopausal symptoms   . Osteopenia    Hx of vitamin D deficiency  . Osteopenia   . Symptomatic menopausal or female climacteric states   . White matter abnormality on MRI of brain     Past Surgical History:  Procedure Laterality Date  . APPENDECTOMY    . CARPAL TUNNEL RELEASE Left   . CATARACT EXTRACTION, BILATERAL    . COLONOSCOPY      MEDICATIONS: . alendronate (FOSAMAX) 70 MG tablet  . Cholecalciferol (VITAMIN D-3) 1000 UNITS CAPS  . donepezil (ARICEPT) 23 MG TABS tablet   . gabapentin (NEURONTIN) 300 MG capsule  . HYDROcodone-acetaminophen (NORCO/VICODIN) 5-325 MG tablet  . loratadine (CLARITIN) 10 MG tablet  . meloxicam (MOBIC) 15 MG tablet  . memantine (NAMENDA) 10 MG tablet  . mirtazapine (REMERON) 15 MG tablet  . Misc Natural Products (GLUCOSAMINE CHONDROITIN TRIPLE) TABS  . Multiple Minerals-Vitamins (CAL MAG ZINC +D3 PO)  . Multiple Vitamin (MULTIVITAMIN) capsule  . Potassium 99 MG TABS  . SUPER B COMPLEX/C PO  . temazepam (RESTORIL) 30 MG capsule  . thiamine (VITAMIN B-1) 100 MG tablet  . traMADol (ULTRAM) 50 MG tablet  . venlafaxine XR (EFFEXOR-XR) 150 MG 24 hr capsule   . betamethasone acetate-betamethasone sodium phosphate (CELESTONE) injection 12 mg    Wynonia Musty [facility] Short Stay Center/Anesthesiology Phone [phone] 09/05/2018 10:41 AM

## 2018-09-05 NOTE — Progress Notes (Signed)
68 year old female with history of low back pain, lower extremity radiculopathy.  States that symptoms unchanged from previous visit.  She is wanting to proceed with LEFT TRANSFORAMINAL LUMBAR INTERBODY FUSION L3-4 WITH DEPUY MPACT PEDICLE SCREWS AND RODS AND CAGE L3-4, DECOMPRESSION/CENTRAL LAMINECTOMY L4-5 as scheduled.  Surgical procedure discussed in detail along with what is to be expected postop.  Patient will be needing short skilled nursing facility placement for rehab postop and would like to go to [facility] in [place].

## 2018-09-05 NOTE — Anesthesia Preprocedure Evaluation (Addendum)
Anesthesia Evaluation  Patient identified by MRN, date of birth, ID band Patient awake    Reviewed: Allergy & Precautions, NPO status , Patient's Chart, lab work & pertinent test results  Airway Mallampati: II  TM Distance: >3 FB Neck ROM: Full    Dental  (+) Teeth Intact, Dental Advisory Given   Pulmonary neg pulmonary ROS,    Pulmonary exam normal breath sounds clear to auscultation       Cardiovascular Exercise Tolerance: Good (-) hypertension(-) angina(-) CAD and (-) Past MI negative cardio ROS Normal cardiovascular exam Rhythm:Regular Rate:Normal     Neuro/Psych  Headaches, PSYCHIATRIC DISORDERS Anxiety Depression Dementia Chiari malformation  Neuromuscular disease    GI/Hepatic Neg liver ROS, GERD  ,  Endo/Other  negative endocrine ROS  Renal/GU negative Renal ROS     Musculoskeletal  (+) Arthritis ,   Abdominal   Peds  Hematology negative hematology ROS (+)   Anesthesia Other Findings Day of surgery medications reviewed with the patient.  Reproductive/Obstetrics                            Anesthesia Physical Anesthesia Plan  ASA: III  Anesthesia Plan: General   Post-op Pain Management:    Induction:   PONV Risk Score and Plan: 4 or greater and Midazolam, Dexamethasone, Ondansetron and Propofol infusion  Airway Management Planned: Oral ETT  Additional Equipment:   Intra-op Plan:   Post-operative Plan: Extubation in OR  Informed Consent: I have reviewed the patients History and Physical, chart, labs and discussed the procedure including the risks, benefits and alternatives for the proposed anesthesia with the patient or authorized representative who has indicated his/her understanding and acceptance.     Dental advisory given  Plan Discussed with: CRNA  Anesthesia Plan Comments: (2nd IV after induction.)      Anesthesia Quick Evaluation

## 2018-09-09 ENCOUNTER — Inpatient Hospital Stay (HOSPITAL_COMMUNITY): Payer: Medicare Other

## 2018-09-09 ENCOUNTER — Inpatient Hospital Stay (HOSPITAL_COMMUNITY): Payer: Medicare Other | Admitting: Certified Registered Nurse Anesthetist

## 2018-09-09 ENCOUNTER — Other Ambulatory Visit: Payer: Self-pay

## 2018-09-09 ENCOUNTER — Inpatient Hospital Stay (HOSPITAL_COMMUNITY): Payer: Medicare Other | Admitting: Physician Assistant

## 2018-09-09 ENCOUNTER — Encounter (HOSPITAL_COMMUNITY): Payer: Self-pay

## 2018-09-09 ENCOUNTER — Inpatient Hospital Stay (HOSPITAL_COMMUNITY)
Admission: RE | Admit: 2018-09-09 | Discharge: 2018-09-12 | DRG: 453 | Disposition: A | Discharge status: Skilled Nursing Facility | Payer: Medicare Other | Attending: Specialist | Admitting: Specialist

## 2018-09-09 ENCOUNTER — Encounter (HOSPITAL_COMMUNITY)
Admission: RE | Disposition: A | Discharge status: Skilled Nursing Facility | Payer: Self-pay | Source: Home / Self Care | Attending: Specialist

## 2018-09-09 DIAGNOSIS — Z818 Family history of other mental and behavioral disorders: Secondary | ICD-10-CM | POA: Diagnosis not present

## 2018-09-09 DIAGNOSIS — M8580 Other specified disorders of bone density and structure, unspecified site: Secondary | ICD-10-CM | POA: Diagnosis present

## 2018-09-09 DIAGNOSIS — Z791 Long term (current) use of non-steroidal anti-inflammatories (NSAID): Secondary | ICD-10-CM

## 2018-09-09 DIAGNOSIS — Z79899 Other long term (current) drug therapy: Secondary | ICD-10-CM | POA: Diagnosis not present

## 2018-09-09 DIAGNOSIS — Z8249 Family history of ischemic heart disease and other diseases of the circulatory system: Secondary | ICD-10-CM | POA: Diagnosis not present

## 2018-09-09 DIAGNOSIS — R55 Syncope and collapse: Secondary | ICD-10-CM | POA: Diagnosis not present

## 2018-09-09 DIAGNOSIS — Z833 Family history of diabetes mellitus: Secondary | ICD-10-CM | POA: Diagnosis not present

## 2018-09-09 DIAGNOSIS — M4326 Fusion of spine, lumbar region: Secondary | ICD-10-CM | POA: Diagnosis not present

## 2018-09-09 DIAGNOSIS — M48062 Spinal stenosis, lumbar region with neurogenic claudication: Secondary | ICD-10-CM | POA: Diagnosis not present

## 2018-09-09 DIAGNOSIS — E559 Vitamin D deficiency, unspecified: Secondary | ICD-10-CM | POA: Diagnosis present

## 2018-09-09 DIAGNOSIS — Z419 Encounter for procedure for purposes other than remedying health state, unspecified: Secondary | ICD-10-CM

## 2018-09-09 DIAGNOSIS — F419 Anxiety disorder, unspecified: Secondary | ICD-10-CM | POA: Diagnosis not present

## 2018-09-09 DIAGNOSIS — M48061 Spinal stenosis, lumbar region without neurogenic claudication: Secondary | ICD-10-CM | POA: Diagnosis not present

## 2018-09-09 DIAGNOSIS — M5416 Radiculopathy, lumbar region: Secondary | ICD-10-CM | POA: Diagnosis present

## 2018-09-09 DIAGNOSIS — G9741 Accidental puncture or laceration of dura during a procedure: Secondary | ICD-10-CM | POA: Diagnosis not present

## 2018-09-09 DIAGNOSIS — F329 Major depressive disorder, single episode, unspecified: Secondary | ICD-10-CM | POA: Diagnosis present

## 2018-09-09 DIAGNOSIS — Z7983 Long term (current) use of bisphosphonates: Secondary | ICD-10-CM | POA: Diagnosis not present

## 2018-09-09 DIAGNOSIS — Z823 Family history of stroke: Secondary | ICD-10-CM

## 2018-09-09 DIAGNOSIS — Z803 Family history of malignant neoplasm of breast: Secondary | ICD-10-CM

## 2018-09-09 DIAGNOSIS — K219 Gastro-esophageal reflux disease without esophagitis: Secondary | ICD-10-CM | POA: Diagnosis present

## 2018-09-09 DIAGNOSIS — G5602 Carpal tunnel syndrome, left upper limb: Secondary | ICD-10-CM | POA: Diagnosis not present

## 2018-09-09 DIAGNOSIS — Z4789 Encounter for other orthopedic aftercare: Secondary | ICD-10-CM | POA: Diagnosis not present

## 2018-09-09 DIAGNOSIS — D62 Acute posthemorrhagic anemia: Secondary | ICD-10-CM | POA: Diagnosis not present

## 2018-09-09 DIAGNOSIS — G935 Compression of brain: Secondary | ICD-10-CM | POA: Diagnosis present

## 2018-09-09 DIAGNOSIS — M2550 Pain in unspecified joint: Secondary | ICD-10-CM | POA: Diagnosis not present

## 2018-09-09 DIAGNOSIS — Z79891 Long term (current) use of opiate analgesic: Secondary | ICD-10-CM

## 2018-09-09 DIAGNOSIS — R010 Benign and innocent cardiac murmurs: Secondary | ICD-10-CM | POA: Diagnosis not present

## 2018-09-09 DIAGNOSIS — F0390 Unspecified dementia without behavioral disturbance: Secondary | ICD-10-CM | POA: Diagnosis present

## 2018-09-09 DIAGNOSIS — G4700 Insomnia, unspecified: Secondary | ICD-10-CM | POA: Diagnosis present

## 2018-09-09 DIAGNOSIS — M4316 Spondylolisthesis, lumbar region: Secondary | ICD-10-CM | POA: Diagnosis not present

## 2018-09-09 DIAGNOSIS — Z981 Arthrodesis status: Secondary | ICD-10-CM | POA: Diagnosis not present

## 2018-09-09 DIAGNOSIS — M899 Disorder of bone, unspecified: Secondary | ICD-10-CM | POA: Diagnosis not present

## 2018-09-09 DIAGNOSIS — D649 Anemia, unspecified: Secondary | ICD-10-CM | POA: Diagnosis not present

## 2018-09-09 DIAGNOSIS — Z8261 Family history of arthritis: Secondary | ICD-10-CM | POA: Diagnosis not present

## 2018-09-09 DIAGNOSIS — F05 Delirium due to known physiological condition: Secondary | ICD-10-CM | POA: Diagnosis not present

## 2018-09-09 DIAGNOSIS — E46 Unspecified protein-calorie malnutrition: Secondary | ICD-10-CM | POA: Diagnosis not present

## 2018-09-09 DIAGNOSIS — Z7401 Bed confinement status: Secondary | ICD-10-CM | POA: Diagnosis not present

## 2018-09-09 DIAGNOSIS — M949 Disorder of cartilage, unspecified: Secondary | ICD-10-CM | POA: Diagnosis not present

## 2018-09-09 DIAGNOSIS — M1990 Unspecified osteoarthritis, unspecified site: Secondary | ICD-10-CM | POA: Diagnosis not present

## 2018-09-09 DIAGNOSIS — R5381 Other malaise: Secondary | ICD-10-CM | POA: Diagnosis not present

## 2018-09-09 HISTORY — PX: LUMBAR FUSION: SHX111

## 2018-09-09 SURGERY — POSTERIOR LUMBAR FUSION 1 LEVEL
Anesthesia: General

## 2018-09-09 MED ORDER — VITAMIN D 25 MCG (1000 UNIT) PO TABS
1000.0000 [IU] | ORAL_TABLET | Freq: Every day | ORAL | Status: DC
  Administered 2018-09-10 – 2018-09-12 (×3): 1000 [IU] via ORAL
  Filled 2018-09-09 (×3): qty 1

## 2018-09-09 MED ORDER — HEMOSTATIC AGENTS (NO CHARGE) OPTIME
TOPICAL | Status: DC | PRN
  Administered 2018-09-09: 1 via TOPICAL

## 2018-09-09 MED ORDER — VITAMIN B-1 100 MG PO TABS
100.0000 mg | ORAL_TABLET | Freq: Every day | ORAL | Status: DC
  Administered 2018-09-10 – 2018-09-12 (×3): 100 mg via ORAL
  Filled 2018-09-09 (×3): qty 1

## 2018-09-09 MED ORDER — MORPHINE SULFATE (PF) 2 MG/ML IV SOLN: 1.0000 mg | INTRAVENOUS | Status: DC | PRN

## 2018-09-09 MED ORDER — ROCURONIUM BROMIDE 50 MG/5ML IV SOSY
PREFILLED_SYRINGE | INTRAVENOUS | Status: DC | PRN
  Administered 2018-09-09: 50 mg via INTRAVENOUS

## 2018-09-09 MED ORDER — THROMBIN (RECOMBINANT) 20000 UNITS EX SOLR
CUTANEOUS | Status: AC
  Filled 2018-09-09: qty 20000

## 2018-09-09 MED ORDER — LACTATED RINGERS IV SOLN
INTRAVENOUS | Status: DC | PRN
  Administered 2018-09-09 (×2): via INTRAVENOUS

## 2018-09-09 MED ORDER — ONDANSETRON HCL 4 MG/2ML IJ SOLN
INTRAMUSCULAR | Status: AC
  Filled 2018-09-09: qty 2

## 2018-09-09 MED ORDER — CEFAZOLIN SODIUM-DEXTROSE 1-4 GM/50ML-% IV SOLN
1.0000 g | Freq: Three times a day (TID) | INTRAVENOUS | Status: AC
  Administered 2018-09-09 (×2): 1 g via INTRAVENOUS
  Filled 2018-09-09 (×2): qty 50

## 2018-09-09 MED ORDER — ACETAMINOPHEN 325 MG PO TABS: 650.0000 mg | ORAL_TABLET | ORAL | Status: DC | PRN

## 2018-09-09 MED ORDER — LIDOCAINE 2% (20 MG/ML) 5 ML SYRINGE
INTRAMUSCULAR | Status: AC
  Filled 2018-09-09: qty 5

## 2018-09-09 MED ORDER — ACETAMINOPHEN 500 MG PO TABS
1000.0000 mg | ORAL_TABLET | Freq: Once | ORAL | Status: AC
  Administered 2018-09-09: 1000 mg via ORAL
  Filled 2018-09-09: qty 2

## 2018-09-09 MED ORDER — ONDANSETRON HCL 4 MG/2ML IJ SOLN
INTRAMUSCULAR | Status: DC | PRN
  Administered 2018-09-09: 4 mg via INTRAVENOUS

## 2018-09-09 MED ORDER — BISACODYL 5 MG PO TBEC
5.0000 mg | DELAYED_RELEASE_TABLET | Freq: Every day | ORAL | Status: DC | PRN
  Administered 2018-09-11: 5 mg via ORAL
  Filled 2018-09-09: qty 1

## 2018-09-09 MED ORDER — DEXAMETHASONE SODIUM PHOSPHATE 10 MG/ML IJ SOLN
INTRAMUSCULAR | Status: AC
  Filled 2018-09-09: qty 1

## 2018-09-09 MED ORDER — ALBUMIN HUMAN 5 % IV SOLN
INTRAVENOUS | Status: DC | PRN
  Administered 2018-09-09 (×2): via INTRAVENOUS

## 2018-09-09 MED ORDER — ROCURONIUM BROMIDE 50 MG/5ML IV SOSY
PREFILLED_SYRINGE | INTRAVENOUS | Status: AC
  Filled 2018-09-09: qty 5

## 2018-09-09 MED ORDER — LORATADINE 10 MG PO TABS
10.0000 mg | ORAL_TABLET | Freq: Every day | ORAL | Status: DC
  Administered 2018-09-10 – 2018-09-12 (×3): 10 mg via ORAL
  Filled 2018-09-09 (×3): qty 1

## 2018-09-09 MED ORDER — DOCUSATE SODIUM 100 MG PO CAPS
100.0000 mg | ORAL_CAPSULE | Freq: Two times a day (BID) | ORAL | Status: DC
  Administered 2018-09-09 – 2018-09-12 (×6): 100 mg via ORAL
  Filled 2018-09-09 (×6): qty 1

## 2018-09-09 MED ORDER — CEFAZOLIN SODIUM-DEXTROSE 2-4 GM/100ML-% IV SOLN
2.0000 g | INTRAVENOUS | Status: AC
  Administered 2018-09-09: 2 g via INTRAVENOUS

## 2018-09-09 MED ORDER — ONDANSETRON HCL 4 MG/2ML IJ SOLN: 4.0000 mg | Freq: Four times a day (QID) | INTRAMUSCULAR | Status: DC | PRN

## 2018-09-09 MED ORDER — HYDROCODONE-ACETAMINOPHEN 10-325 MG PO TABS: 2.0000 | ORAL_TABLET | ORAL | Status: DC | PRN

## 2018-09-09 MED ORDER — PHENYLEPHRINE 40 MCG/ML (10ML) SYRINGE FOR IV PUSH (FOR BLOOD PRESSURE SUPPORT)
PREFILLED_SYRINGE | INTRAVENOUS | Status: AC
  Filled 2018-09-09: qty 10

## 2018-09-09 MED ORDER — DONEPEZIL HCL 23 MG PO TABS
23.0000 mg | ORAL_TABLET | Freq: Every day | ORAL | Status: DC
  Administered 2018-09-09 – 2018-09-11 (×3): 23 mg via ORAL
  Filled 2018-09-09 (×3): qty 1

## 2018-09-09 MED ORDER — KETAMINE HCL 10 MG/ML IJ SOLN
INTRAMUSCULAR | Status: DC | PRN
  Administered 2018-09-09 (×5): 10 mg via INTRAVENOUS

## 2018-09-09 MED ORDER — METHOCARBAMOL 500 MG PO TABS
ORAL_TABLET | ORAL | Status: AC
  Filled 2018-09-09: qty 1

## 2018-09-09 MED ORDER — THROMBIN 20000 UNITS EX SOLR: CUTANEOUS | Status: DC | PRN

## 2018-09-09 MED ORDER — POLYETHYLENE GLYCOL 3350 17 G PO PACK
17.0000 g | PACK | Freq: Every day | ORAL | Status: DC | PRN
  Administered 2018-09-11: 17 g via ORAL
  Filled 2018-09-09: qty 1

## 2018-09-09 MED ORDER — KETOROLAC TROMETHAMINE 15 MG/ML IJ SOLN
INTRAMUSCULAR | Status: AC
  Administered 2018-09-09: 15 mg
  Filled 2018-09-09: qty 1

## 2018-09-09 MED ORDER — LACTATED RINGERS IV SOLN
INTRAVENOUS | Status: DC | PRN
  Administered 2018-09-09: 08:00:00 via INTRAVENOUS

## 2018-09-09 MED ORDER — HYDROMORPHONE HCL 1 MG/ML IJ SOLN
0.2500 mg | INTRAMUSCULAR | Status: DC | PRN
  Administered 2018-09-09 (×2): 0.5 mg via INTRAVENOUS

## 2018-09-09 MED ORDER — SURGIFOAM 100 EX MISC
CUTANEOUS | Status: DC | PRN
  Administered 2018-09-09: 1 via TOPICAL

## 2018-09-09 MED ORDER — BUPIVACAINE HCL 0.5 % IJ SOLN
INTRAMUSCULAR | Status: DC | PRN
  Administered 2018-09-09: 10 mL

## 2018-09-09 MED ORDER — GABAPENTIN 300 MG PO CAPS
300.0000 mg | ORAL_CAPSULE | Freq: Once | ORAL | Status: AC
  Administered 2018-09-09: 300 mg via ORAL
  Filled 2018-09-09: qty 1

## 2018-09-09 MED ORDER — BUPIVACAINE HCL (PF) 0.5 % IJ SOLN
INTRAMUSCULAR | Status: AC
  Filled 2018-09-09: qty 30

## 2018-09-09 MED ORDER — PHENOL 1.4 % MT LIQD: 1.0000 | OROMUCOSAL | Status: DC | PRN

## 2018-09-09 MED ORDER — SODIUM CHLORIDE 0.9 % IV SOLN
INTRAVENOUS | Status: DC
  Administered 2018-09-09: 16:00:00 via INTRAVENOUS

## 2018-09-09 MED ORDER — PROPOFOL 10 MG/ML IV BOLUS
INTRAVENOUS | Status: AC
  Filled 2018-09-09: qty 40

## 2018-09-09 MED ORDER — HYDROCODONE-ACETAMINOPHEN 7.5-325 MG PO TABS
ORAL_TABLET | ORAL | Status: AC
  Filled 2018-09-09: qty 1

## 2018-09-09 MED ORDER — GABAPENTIN 300 MG PO CAPS
300.0000 mg | ORAL_CAPSULE | Freq: Two times a day (BID) | ORAL | Status: DC
  Administered 2018-09-09 – 2018-09-12 (×7): 300 mg via ORAL
  Filled 2018-09-09 (×7): qty 1

## 2018-09-09 MED ORDER — FENTANYL CITRATE (PF) 250 MCG/5ML IJ SOLN
INTRAMUSCULAR | Status: AC
  Filled 2018-09-09: qty 5

## 2018-09-09 MED ORDER — HYDROCODONE-ACETAMINOPHEN 7.5-325 MG PO TABS
1.0000 | ORAL_TABLET | ORAL | Status: DC | PRN
  Administered 2018-09-10 – 2018-09-11 (×3): 1 via ORAL
  Filled 2018-09-09 (×3): qty 1

## 2018-09-09 MED ORDER — HYDROMORPHONE HCL 1 MG/ML IJ SOLN: 0.2500 mg | INTRAMUSCULAR | Status: DC | PRN

## 2018-09-09 MED ORDER — MENTHOL 3 MG MT LOZG: 1.0000 | LOZENGE | OROMUCOSAL | Status: DC | PRN

## 2018-09-09 MED ORDER — POTASSIUM 99 MG PO TABS: 99.0000 mg | ORAL_TABLET | Freq: Every day | ORAL | Status: DC

## 2018-09-09 MED ORDER — TRAMADOL HCL 50 MG PO TABS
50.0000 mg | ORAL_TABLET | Freq: Four times a day (QID) | ORAL | Status: DC | PRN
  Administered 2018-09-11: 50 mg via ORAL
  Filled 2018-09-09: qty 1

## 2018-09-09 MED ORDER — KETAMINE HCL 50 MG/5ML IJ SOSY
PREFILLED_SYRINGE | INTRAMUSCULAR | Status: AC
  Filled 2018-09-09: qty 5

## 2018-09-09 MED ORDER — SODIUM CHLORIDE 0.9 % IV SOLN: 250.0000 mL | INTRAVENOUS | Status: DC

## 2018-09-09 MED ORDER — EPHEDRINE SULFATE 50 MG/ML IJ SOLN
INTRAMUSCULAR | Status: DC | PRN
  Administered 2018-09-09: 5 mg via INTRAVENOUS

## 2018-09-09 MED ORDER — HYDROCODONE-ACETAMINOPHEN 7.5-325 MG PO TABS
1.0000 | ORAL_TABLET | Freq: Four times a day (QID) | ORAL | Status: DC
  Administered 2018-09-09 – 2018-09-12 (×11): 1 via ORAL
  Filled 2018-09-09 (×10): qty 1

## 2018-09-09 MED ORDER — BUPIVACAINE LIPOSOME 1.3 % IJ SUSP
20.0000 mL | INTRAMUSCULAR | Status: AC
  Administered 2018-09-09: 10 mL
  Filled 2018-09-09: qty 20

## 2018-09-09 MED ORDER — MIDAZOLAM HCL 2 MG/2ML IJ SOLN
INTRAMUSCULAR | Status: AC
  Filled 2018-09-09: qty 2

## 2018-09-09 MED ORDER — MIDAZOLAM HCL 5 MG/5ML IJ SOLN
INTRAMUSCULAR | Status: DC | PRN
  Administered 2018-09-09 (×2): 1 mg via INTRAVENOUS

## 2018-09-09 MED ORDER — MIRTAZAPINE 15 MG PO TABS
15.0000 mg | ORAL_TABLET | Freq: Every day | ORAL | Status: DC
  Administered 2018-09-09 – 2018-09-11 (×3): 15 mg via ORAL
  Filled 2018-09-09 (×3): qty 1

## 2018-09-09 MED ORDER — ACETAMINOPHEN 650 MG RE SUPP: 650.0000 mg | RECTAL | Status: DC | PRN

## 2018-09-09 MED ORDER — SODIUM CHLORIDE 0.9 % IV SOLN
INTRAVENOUS | Status: DC | PRN
  Administered 2018-09-09: 20 ug/min via INTRAVENOUS

## 2018-09-09 MED ORDER — VENLAFAXINE HCL ER 150 MG PO CP24
150.0000 mg | ORAL_CAPSULE | Freq: Two times a day (BID) | ORAL | Status: DC
  Administered 2018-09-09 – 2018-09-12 (×6): 150 mg via ORAL
  Filled 2018-09-09 (×6): qty 1

## 2018-09-09 MED ORDER — SODIUM CHLORIDE 0.9% FLUSH
3.0000 mL | Freq: Two times a day (BID) | INTRAVENOUS | Status: DC
  Administered 2018-09-11 – 2018-09-12 (×3): 3 mL via INTRAVENOUS

## 2018-09-09 MED ORDER — KETOROLAC TROMETHAMINE 15 MG/ML IJ SOLN
7.5000 mg | Freq: Four times a day (QID) | INTRAMUSCULAR | Status: AC
  Administered 2018-09-09 – 2018-09-10 (×4): 7.5 mg via INTRAVENOUS
  Filled 2018-09-09 (×3): qty 1

## 2018-09-09 MED ORDER — MEMANTINE HCL 10 MG PO TABS
10.0000 mg | ORAL_TABLET | Freq: Two times a day (BID) | ORAL | Status: DC
  Administered 2018-09-09 – 2018-09-12 (×6): 10 mg via ORAL
  Filled 2018-09-09 (×6): qty 1

## 2018-09-09 MED ORDER — PROMETHAZINE HCL 25 MG/ML IJ SOLN: 6.2500 mg | INTRAMUSCULAR | Status: DC | PRN

## 2018-09-09 MED ORDER — ALUM & MAG HYDROXIDE-SIMETH 200-200-20 MG/5ML PO SUSP: 30.0000 mL | Freq: Four times a day (QID) | ORAL | Status: DC | PRN

## 2018-09-09 MED ORDER — CHLORHEXIDINE GLUCONATE 4 % EX LIQD: 60.0000 mL | Freq: Once | CUTANEOUS | Status: DC

## 2018-09-09 MED ORDER — ONDANSETRON HCL 4 MG PO TABS: 4.0000 mg | ORAL_TABLET | Freq: Four times a day (QID) | ORAL | Status: DC | PRN

## 2018-09-09 MED ORDER — TEMAZEPAM 15 MG PO CAPS
30.0000 mg | ORAL_CAPSULE | Freq: Every day | ORAL | Status: DC
  Administered 2018-09-09 – 2018-09-11 (×3): 30 mg via ORAL
  Filled 2018-09-09 (×3): qty 2

## 2018-09-09 MED ORDER — DEXAMETHASONE SODIUM PHOSPHATE 4 MG/ML IJ SOLN
INTRAMUSCULAR | Status: DC | PRN
  Administered 2018-09-09 (×2): 5 mg via INTRAVENOUS

## 2018-09-09 MED ORDER — SODIUM CHLORIDE 0.9% FLUSH: 3.0000 mL | INTRAVENOUS | Status: DC | PRN

## 2018-09-09 MED ORDER — FLEET ENEMA 7-19 GM/118ML RE ENEM: 1.0000 | ENEMA | Freq: Once | RECTAL | Status: DC | PRN

## 2018-09-09 MED ORDER — PANTOPRAZOLE SODIUM 40 MG IV SOLR
40.0000 mg | Freq: Every day | INTRAVENOUS | Status: DC
  Administered 2018-09-09: 40 mg via INTRAVENOUS
  Filled 2018-09-09: qty 40

## 2018-09-09 MED ORDER — HYDROMORPHONE HCL 1 MG/ML IJ SOLN
INTRAMUSCULAR | Status: AC
  Filled 2018-09-09: qty 1

## 2018-09-09 MED ORDER — 0.9 % SODIUM CHLORIDE (POUR BTL) OPTIME
TOPICAL | Status: DC | PRN
  Administered 2018-09-09: 1000 mL

## 2018-09-09 MED ORDER — PHENYLEPHRINE HCL 10 MG/ML IJ SOLN
INTRAMUSCULAR | Status: DC | PRN
  Administered 2018-09-09: 80 ug via INTRAVENOUS

## 2018-09-09 MED ORDER — PROPOFOL 10 MG/ML IV BOLUS
INTRAVENOUS | Status: DC | PRN
  Administered 2018-09-09: 120 mg via INTRAVENOUS

## 2018-09-09 MED ORDER — FENTANYL CITRATE (PF) 100 MCG/2ML IJ SOLN
INTRAMUSCULAR | Status: DC | PRN
  Administered 2018-09-09: 50 ug via INTRAVENOUS
  Administered 2018-09-09 (×2): 100 ug via INTRAVENOUS

## 2018-09-09 MED ORDER — METHOCARBAMOL 1000 MG/10ML IJ SOLN
500.0000 mg | Freq: Four times a day (QID) | INTRAVENOUS | Status: DC | PRN
  Filled 2018-09-09: qty 5

## 2018-09-09 MED ORDER — EPHEDRINE 5 MG/ML INJ
INTRAVENOUS | Status: AC
  Filled 2018-09-09: qty 10

## 2018-09-09 MED ORDER — METHOCARBAMOL 500 MG PO TABS
500.0000 mg | ORAL_TABLET | Freq: Four times a day (QID) | ORAL | Status: DC | PRN
  Administered 2018-09-09: 500 mg via ORAL

## 2018-09-09 MED ORDER — LIDOCAINE 2% (20 MG/ML) 5 ML SYRINGE
INTRAMUSCULAR | Status: DC | PRN
  Administered 2018-09-09: 80 mg via INTRAVENOUS

## 2018-09-09 MED ORDER — CELECOXIB 200 MG PO CAPS
200.0000 mg | ORAL_CAPSULE | Freq: Two times a day (BID) | ORAL | Status: DC
  Administered 2018-09-09 – 2018-09-12 (×7): 200 mg via ORAL
  Filled 2018-09-09 (×7): qty 1

## 2018-09-09 SURGICAL SUPPLY — 85 items
ADH SKN CLS APL DERMABOND .7 (GAUZE/BANDAGES/DRESSINGS) ×1
AGENT HMST MTR 8 SURGIFLO (HEMOSTASIS)
BLADE CLIPPER SURG (BLADE) IMPLANT
BONE CANC CHIPS 20CC PCAN1/4 (Bone Implant) ×3 IMPLANT
BONE VIVIGEN FORMABLE 5.4CC (Bone Implant) ×3 IMPLANT
BUR MATCHSTICK NEURO 3.0 LAGG (BURR) ×3 IMPLANT
BUR SABER RD CUTTING 3.0 (BURR) IMPLANT
BUR SABER RD CUTTING 3.0MM (BURR)
CANNULA FEN OPEN EXPEDIUM (MISCELLANEOUS) ×8 IMPLANT
CHIPS CANC BONE 20CC PCAN1/4 (Bone Implant) ×1 IMPLANT
CONNECTOR CROSS SFX A2 (Connector) ×2 IMPLANT
COVER BACK TABLE 80X110 HD (DRAPES) ×3 IMPLANT
COVER SURGICAL LIGHT HANDLE (MISCELLANEOUS) ×3 IMPLANT
COVER WAND RF STERILE (DRAPES) ×3 IMPLANT
DECANTER SPIKE VIAL GLASS SM (MISCELLANEOUS) ×3 IMPLANT
DERMABOND ADVANCED (GAUZE/BANDAGES/DRESSINGS) ×2
DERMABOND ADVANCED .7 DNX12 (GAUZE/BANDAGES/DRESSINGS) ×1 IMPLANT
DRAPE C-ARM 42X72 X-RAY (DRAPES) ×3 IMPLANT
DRAPE C-ARMOR (DRAPES) ×3 IMPLANT
DRAPE INCISE IOBAN 66X45 STRL (DRAPES) ×2 IMPLANT
DRAPE MICROSCOPE LEICA (MISCELLANEOUS) ×3 IMPLANT
DRAPE POUCH INSTRU U-SHP 10X18 (DRAPES) ×3 IMPLANT
DRAPE SURG 17X23 STRL (DRAPES) ×12 IMPLANT
DRSG MEPILEX BORDER 4X4 (GAUZE/BANDAGES/DRESSINGS) IMPLANT
DRSG MEPILEX BORDER 4X8 (GAUZE/BANDAGES/DRESSINGS) ×2 IMPLANT
DURAPREP 26ML APPLICATOR (WOUND CARE) ×3 IMPLANT
DURASEAL SPINE SEALANT 3ML (MISCELLANEOUS) ×2 IMPLANT
ELECT BLADE 4.0 EZ CLEAN MEGAD (MISCELLANEOUS) ×3
ELECT BLADE 6.5 EXT (BLADE) ×2 IMPLANT
ELECT CAUTERY BLADE 6.4 (BLADE) ×3 IMPLANT
ELECT REM PT RETURN 9FT ADLT (ELECTROSURGICAL) ×3
ELECTRODE BLDE 4.0 EZ CLN MEGD (MISCELLANEOUS) ×1 IMPLANT
ELECTRODE REM PT RTRN 9FT ADLT (ELECTROSURGICAL) ×1 IMPLANT
EVACUATOR 1/8 PVC DRAIN (DRAIN) IMPLANT
GAUZE SPONGE 4X4 12PLY STRL (GAUZE/BANDAGES/DRESSINGS) ×3 IMPLANT
GLOVE BIOGEL PI IND STRL 8 (GLOVE) ×1 IMPLANT
GLOVE BIOGEL PI INDICATOR 8 (GLOVE) ×2
GLOVE ECLIPSE 9.0 STRL (GLOVE) ×3 IMPLANT
GLOVE ORTHO TXT STRL SZ7.5 (GLOVE) ×3 IMPLANT
GLOVE SURG 8.5 LATEX PF (GLOVE) ×3 IMPLANT
GOWN STRL REUS W/ TWL LRG LVL3 (GOWN DISPOSABLE) ×1 IMPLANT
GOWN STRL REUS W/TWL 2XL LVL3 (GOWN DISPOSABLE) ×6 IMPLANT
GOWN STRL REUS W/TWL LRG LVL3 (GOWN DISPOSABLE) ×9
GRAFT BNE CANC CHIPS 1-8 20CC (Bone Implant) IMPLANT
GRAFT BNE MATRIX VG FRMBL MD 5 (Bone Implant) IMPLANT
KIT BASIN OR (CUSTOM PROCEDURE TRAY) ×3 IMPLANT
KIT POSITION SURG JACKSON T1 (MISCELLANEOUS) ×3 IMPLANT
KIT TURNOVER KIT B (KITS) ×3 IMPLANT
NDL SPNL 18GX3.5 QUINCKE PK (NEEDLE) ×1 IMPLANT
NEEDLE 22X1 1/2 (OR ONLY) (NEEDLE) ×3 IMPLANT
NEEDLE SPNL 18GX3.5 QUINCKE PK (NEEDLE) ×3 IMPLANT
NS IRRIG 1000ML POUR BTL (IV SOLUTION) ×3 IMPLANT
PACK LAMINECTOMY ORTHO (CUSTOM PROCEDURE TRAY) ×3 IMPLANT
PAD ARMBOARD 7.5X6 YLW CONV (MISCELLANEOUS) ×6 IMPLANT
PATTIES SURGICAL .75X.75 (GAUZE/BANDAGES/DRESSINGS) ×3 IMPLANT
PATTIES SURGICAL 1X1 (DISPOSABLE) ×3 IMPLANT
ROD PRE BENT EXP 40MM (Rod) ×4 IMPLANT
SCREW SET SINGLE INNER (Screw) ×8 IMPLANT
SCREW VIPER 7X45MM (Screw) ×6 IMPLANT
SCREW VIPER 7X50MM (Screw) ×2 IMPLANT
SPACER CONCORDE LOR 9X10X27 5D (Spacer) ×2 IMPLANT
SPOGE SURGIFLO 8M (HEMOSTASIS)
SPONGE SURGIFLO 8M (HEMOSTASIS) IMPLANT
SPONGE SURGIFOAM ABS GEL 100 (HEMOSTASIS) ×3 IMPLANT
SUT NURALON 4 0 TR CR/8 (SUTURE) ×2 IMPLANT
SUT VIC AB 0 CT1 27 (SUTURE) ×3
SUT VIC AB 0 CT1 27XBRD ANBCTR (SUTURE) ×1 IMPLANT
SUT VIC AB 1 CTX 36 (SUTURE) ×6
SUT VIC AB 1 CTX36XBRD ANBCTR (SUTURE) ×2 IMPLANT
SUT VIC AB 2-0 CT1 27 (SUTURE) ×3
SUT VIC AB 2-0 CT1 TAPERPNT 27 (SUTURE) ×1 IMPLANT
SUT VIC AB 3-0 X1 27 (SUTURE) ×3 IMPLANT
SYR 20CC LL (SYRINGE) ×3 IMPLANT
SYR CONTROL 10ML LL (SYRINGE) ×6 IMPLANT
SYS SPINAL CEMT CONFIDENCE 11C (Cement) ×3 IMPLANT
SYSTEM SPINAL CEMT CONFDNC 11C (Cement) IMPLANT
TAP CANN VIPER2 DL 5.0 (TAP) ×2 IMPLANT
TAP CANN VIPER2 DL 6.0 (TAP) ×2 IMPLANT
TAP CANN VIPER2 DL 7.0 (TAP) ×2 IMPLANT
TAP VIPER MIS 4.35MM (TAP) ×2 IMPLANT
TOWEL GREEN STERILE (TOWEL DISPOSABLE) ×3 IMPLANT
TOWEL GREEN STERILE FF (TOWEL DISPOSABLE) ×3 IMPLANT
TRAY FOLEY MTR SLVR 16FR STAT (SET/KITS/TRAYS/PACK) ×3 IMPLANT
WATER STERILE IRR 1000ML POUR (IV SOLUTION) ×3 IMPLANT
YANKAUER SUCT BULB TIP NO VENT (SUCTIONS) ×3 IMPLANT

## 2018-09-09 NOTE — Brief Op Note (Signed)
09/09/2018  12:35 PM  PATIENT:  Carly Larson  68 y.o. female  PRE-OPERATIVE DIAGNOSIS:  spondylolisthesis L3-4, lumbar spinal stenosis L4-5, left L4 radiculopathy  POST-OPERATIVE DIAGNOSIS:  spondylolisthesis L3-4, lumbar spinal stenosis L4-5, left L4   PROCEDURE:  Procedure(s): LEFT TRANSFORAMINAL LUMBAR INTERBODY FUSION L3-4 WITH DEPUY MPACT PEDICLE SCREWS AND RODS AND CAGE L3-4, DECOMPRESSION/CENTRAL LAMINECTOMY L4-5 (N/A)  SURGEON:  Surgeon(s) and Role:    * Jessy Oto, MD - Primary  PHYSICIAN ASSISTANT: Benjiman Core, PA-C  ANESTHESIA:   local and general, Dr.Turk, Ebbie Latus CRNA   EBL:  098 mL   COMPLICATION: 2 tiny dural tears repair with one 4-0 Neurolon suture each and duraseal. Left L3-4. No neural element involvement.  BLOOD ADMINISTERED:none and 0 CC CELLSAVER  DRAINS: Urinary Catheter (Foley)   LOCAL MEDICATIONS USED:  MARCAINE 0.5% 1:1 EXPAREL 1.3%    and Amount:20 ml  SPECIMEN:  No Specimen  DISPOSITION OF SPECIMEN:  N/A  COUNTS:  YES  TOURNIQUET:  * No tourniquets in log *  DICTATION: .Dragon Dictation  PLAN OF CARE: Admit to inpatient   PATIENT DISPOSITION:  PACU - hemodynamically stable.   Delay start of Pharmacological VTE agent (>24hrs) due to surgical blood loss or risk of bleeding: yes

## 2018-09-09 NOTE — Progress Notes (Signed)
Orthopedic Tech Progress Note Patient Details:  Carly Larson 10-May-1951 [id number]  Patient ID: Rockney Ghee, female   DOB: 05-13-1951, 68 y.o.   MRN: [id number]   [place] Pink 09/09/2018, 3:03 PMCalled [employer] for Lumbar brace.

## 2018-09-09 NOTE — Anesthesia Postprocedure Evaluation (Signed)
Anesthesia Post Note  Patient: Carly Larson  Procedure(s) Performed: LEFT TRANSFORAMINAL LUMBAR INTERBODY FUSION L3-4 WITH DEPUY MPACT PEDICLE SCREWS AND RODS AND CAGE L3-4, DECOMPRESSION/CENTRAL LAMINECTOMY L4-5 (N/A )     Patient location during evaluation: PACU Anesthesia Type: General Level of consciousness: awake and sedated Pain management: pain level controlled Vital Signs Assessment: post-procedure vital signs reviewed and stable Respiratory status: spontaneous breathing Cardiovascular status: stable Postop Assessment: no apparent nausea or vomiting Anesthetic complications: no    Last Vitals:  Vitals:   09/09/18 1400 09/09/18 1415  BP: (!) 125/58   Pulse: (!) 116 (!) 103  Resp: (!) 26 (!) 25  Temp:  (!) 36.3 C  SpO2: 95% 98%    Last Pain:  Vitals:   09/09/18 1415  TempSrc:   PainSc: 5    Pain Goal: Patients Stated Pain Goal: 2 (09/09/18 0616)  LLE Motor Response: Purposeful movement (09/09/18 1355) LLE Sensation: Numbness, Tingling(affirms same preop) (09/09/18 1355) RLE Motor Response: Purposeful movement (09/09/18 1355) RLE Sensation: Numbness, Tingling(affirms same preop) (09/09/18 1355)        Huston Foley

## 2018-09-09 NOTE — Progress Notes (Signed)
Physical Therapy Evaluation Patient Details Name: Carly Larson MRN: [id number] DOB: 03/29/1951 Today's Date: 09/09/2018   History of Present Illness  Patient 68 y/o female s/p PLIF L 3-L4 and decompression L4-5. Patient with tiny dural tear, however per MD notes, okay to mobilize. PMH includes memory loss, anxiety, chiari malformation, and osteopenia.   Clinical Impression  Patient admitted to hospital secondary to problems above and with deficits below. Patient required min guard to stand and ambulate with use of RW. Educated and reviewed back precautions, car transfer technique, and walking program with patient and family. Patient required frequent cues for safety and to maintain precautions secondary to baseline cognitive deficits. Per patient and family, plan is to go to SNF following d/c. Patient will benefit from acute physical therapy to maximize independence and safety with functional mobility.     Follow Up Recommendations SNF    Equipment Recommendations  Other (comment)(TBD at next venue)    Recommendations for Other Services       Precautions / Restrictions Precautions Precautions: Back;Fall Precaution Booklet Issued: Yes (comment) Precaution Comments: reviewed back precautions with patient Required Braces or Orthoses: Spinal Brace Spinal Brace: Lumbar corset;Applied in sitting position Restrictions Weight Bearing Restrictions: No      Mobility  Bed Mobility Overal bed mobility: Needs Assistance Bed Mobility: Supine to Sit     Supine to sit: Supervision     General bed mobility comments: Patient required supervision for bed mobility for safety. Required cues for log roll technique to adhere to precautions. Patient with difficulty following commands and rolled too far during technique. Required step by step cues for correction. Educated about reverse log roll technique to return to supine.   Transfers Overall transfer level: Needs assistance Equipment used:  None;Rolling walker (2 wheeled) Transfers: Sit to/from Stand Sit to Stand: Min guard         General transfer comment: Patient required min guard to stand x2. Patient with unsteadiness in standing. Improved stability with increased BOS. Second stand completed with RW. Verbal cues for hand placement prior to standing when using RW.   Ambulation/Gait Ambulation/Gait assistance: Min guard Gait Distance (Feet): 75 Feet(x2) Assistive device: Rolling walker (2 wheeled) Gait Pattern/deviations: Step-through pattern;Decreased step length - right;Decreased step length - left;Decreased stride length Gait velocity: decreased Gait velocity interpretation: <1.8 ft/sec, indicate of risk for recurrent falls General Gait Details: Attempted ambulation with IV pole but patient with unsteadiness. Transitioned to RW with improved stability. Patient ambulated with min guard and use of RW for safety. Required 1 standing rest break during ambulation. Verbal cues for sequencing with RW. Verbal cues for proximity to RW.   Stairs            Wheelchair Mobility    Modified Rankin (Stroke Patients Only)       Balance Overall balance assessment: Needs assistance Sitting-balance support: No upper extremity supported;Feet supported Sitting balance-Leahy Scale: Good     Standing balance support: Bilateral upper extremity supported Standing balance-Leahy Scale: Poor Standing balance comment: reliant on BUE support to maintain standing balance                             Pertinent Vitals/Pain Pain Assessment: 0-10 Pain Score: 8  Pain Location: back Pain Descriptors / Indicators: Aching;Operative site guarding;Discomfort;Grimacing Pain Intervention(s): Limited activity within patient's tolerance;Monitored during session;Ice applied;Repositioned;Patient requesting pain meds-RN notified    Home Living Family/patient expects to be discharged to:: Skilled nursing facility  Prior Function Level of Independence: Independent               Hand Dominance        Extremity/Trunk Assessment   Upper Extremity Assessment Upper Extremity Assessment: Defer to OT evaluation    Lower Extremity Assessment Lower Extremity Assessment: Generalized weakness    Cervical / Trunk Assessment Cervical / Trunk Assessment: Normal  Communication   Communication: No difficulties  Cognition Arousal/Alertness: Awake/alert Behavior During Therapy: Impulsive Overall Cognitive Status: History of cognitive impairments - at baseline                                 General Comments: per daughter, patient with slight dementia. Required frequent cues for safety and maintaining precautions during session due to memory loss at baseline.      General Comments General comments (skin integrity, edema, etc.): Patient daughter in room during session. Education provided about car transfer technique and walking program following d/c    Exercises     Assessment/Plan    PT Assessment Patient needs continued PT services  PT Problem List Decreased strength;Decreased range of motion;Decreased activity tolerance;Decreased balance;Decreased mobility;Decreased knowledge of use of DME;Decreased safety awareness;Decreased knowledge of precautions;Pain;Decreased cognition       PT Treatment Interventions DME instruction;Gait training;Functional mobility training;Therapeutic activities;Therapeutic exercise;Balance training;Cognitive remediation;Patient/family education    PT Goals (Current goals can be found in the Care Plan section)  Acute Rehab PT Goals Patient Stated Goal: to decrease pain PT Goal Formulation: With patient Time For Goal Achievement: 09/23/18 Potential to Achieve Goals: Good    Frequency Min 5X/week   Barriers to discharge        Co-evaluation               AM-PAC PT "6 Clicks" Mobility  Outcome Measure Help needed turning  from your back to your side while in a flat bed without using bedrails?: A Little Help needed moving from lying on your back to sitting on the side of a flat bed without using bedrails?: A Little Help needed moving to and from a bed to a chair (including a wheelchair)?: A Little Help needed standing up from a chair using your arms (e.g., wheelchair or bedside chair)?: A Little Help needed to walk in hospital room?: A Little Help needed climbing 3-5 steps with a railing? : A Lot 6 Click Score: 17    End of Session Equipment Utilized During Treatment: Gait belt;Back brace(aspen lumbar brace) Activity Tolerance: Patient tolerated treatment well Patient left: in chair;with call bell/phone within reach;with family/visitor present;with nursing/sitter in room Nurse Communication: Mobility status;Patient requests pain meds PT Visit Diagnosis: Unsteadiness on feet (R26.81);Muscle weakness (generalized) (M62.81);Pain Pain - part of body: (back)    Time: 1705-1740 PT Time Calculation (min) (ACUTE ONLY): 35 min   Charges:   PT Evaluation $PT Eval Moderate Complexity: 1 Mod PT Treatments $Gait Training: 8-22 mins        Erick Blinks, SPT  Erick Blinks 09/09/2018, 6:20 PM

## 2018-09-09 NOTE — H&P (Signed)
Carly Larson is an 68 y.o. female.   Chief Complaint: back pain and leg pain HPI:  68 year old female with history of low back pain, lower extremity radiculopathy.  States that symptoms unchanged from previous visit.  She is wanting to proceed with LEFT TRANSFORAMINAL LUMBAR INTERBODY FUSION L3-4 WITH DEPUY MPACT PEDICLE SCREWS AND RODS AND CAGE L3-4, DECOMPRESSION/CENTRAL LAMINECTOMY L4-5 as scheduled.   Past Medical History:  Diagnosis Date  . Anxiety state, unspecified   . Arthritis   . Carpal tunnel syndrome    left  . Chiari malformation type I ([place])   . Depression   . Disorder of bone and cartilage, unspecified   . Encounter for long-term (current) use of other medications   . Esophageal reflux   . Headache   . High risk medication use   . Insomnia   . Memory loss    MMSE 24/30 on 12/05/11, 30/30 on 10/25/13  . Memory loss   . Menopausal symptoms   . Osteopenia    Hx of vitamin D deficiency  . Osteopenia   . Symptomatic menopausal or female climacteric states   . White matter abnormality on MRI of brain     Past Surgical History:  Procedure Laterality Date  . APPENDECTOMY    . CARPAL TUNNEL RELEASE Left   . CATARACT EXTRACTION, BILATERAL    . COLONOSCOPY      Family History  Problem Relation Age of Onset  . Diabetes Mother   . Osteoarthritis Mother   . Hypertension Mother   . Dementia Mother   . Stroke Father   . Heart attack Brother   . CAD Brother   . Breast cancer Maternal Aunt    Social History:  reports that she has never smoked. She has never used smokeless tobacco. She reports that she does not drink alcohol or use drugs.  Allergies: No Known Allergies  Facility-Administered Medications Prior to Admission  Medication Dose Route Frequency Provider Last Rate Last Dose  . betamethasone acetate-betamethasone sodium phosphate (CELESTONE) injection 12 mg  12 mg Other Once Magnus Sinning, MD       Medications Prior to Admission  Medication Sig Dispense  Refill  . alendronate (FOSAMAX) 70 MG tablet Take 70 mg by mouth every Monday. Take with a full glass of water on an empty stomach.     . Cholecalciferol (VITAMIN D-3) 1000 UNITS CAPS Take 1,000 Units by mouth daily.     Marland Kitchen donepezil (ARICEPT) 23 MG TABS tablet Take 23 mg by mouth at bedtime.     . gabapentin (NEURONTIN) 300 MG capsule TAKE 1 CAPSULE(300 MG) BY MOUTH TWICE DAILY (Patient taking differently: Take 300 mg by mouth 2 (two) times daily. ) 60 capsule 0  . HYDROcodone-acetaminophen (NORCO/VICODIN) 5-325 MG tablet Take 1 tablet by mouth daily as needed for moderate pain.    Marland Kitchen loratadine (CLARITIN) 10 MG tablet Take 10 mg by mouth daily.    . meloxicam (MOBIC) 15 MG tablet Take 15 mg by mouth daily.    . memantine (NAMENDA) 10 MG tablet Take 10 mg by mouth 2 (two) times daily.    . mirtazapine (REMERON) 15 MG tablet Take 15 mg by mouth at bedtime.   2  . Misc Natural Products (GLUCOSAMINE CHONDROITIN TRIPLE) TABS Take 1 tablet by mouth daily.    . Multiple Minerals-Vitamins (CAL MAG ZINC +D3 PO) Take 1 tablet by mouth daily.    . Multiple Vitamin (MULTIVITAMIN) capsule Take 1 capsule by mouth daily.    Marland Kitchen  Potassium 99 MG TABS Take 99 mg by mouth daily.    . SUPER B COMPLEX/C PO Take 1 tablet by mouth daily.    . temazepam (RESTORIL) 30 MG capsule Take 30 mg by mouth at bedtime.     . thiamine (VITAMIN B-1) 100 MG tablet Take 100 mg by mouth daily.    . traMADol (ULTRAM) 50 MG tablet Take 1 tablet (50 mg total) by mouth every 6 (six) hours as needed for up to 7 days. (Patient taking differently: Take 50 mg by mouth every 6 (six) hours as needed for moderate pain. ) 28 tablet 0  . venlafaxine XR (EFFEXOR-XR) 150 MG 24 hr capsule Take 150 mg by mouth 2 (two) times daily.      No results found for this or any previous visit (from the past 48 hour(s)). No results found.  Review of Systems  Constitutional: Negative.   HENT: Negative.   Respiratory: Negative.   Cardiovascular: Negative.    Gastrointestinal: Negative.   Genitourinary: Negative.   Musculoskeletal: Positive for back pain.  Skin: Negative.   Neurological: Positive for tingling.  Psychiatric/Behavioral:       Hx dementia    Blood pressure (!) 148/53, pulse 85, temperature 98.2 F (36.8 C), temperature source Oral, resp. rate 20, height 5' (1.524 m), weight 59 kg, SpO2 98 %. Physical Exam  Constitutional: She is oriented to person, place, and time. She appears well-developed. No distress.  HENT:  Head: Normocephalic and atraumatic.  Eyes: Pupils are equal, round, and reactive to light. EOM are normal.  Neck: Normal range of motion.  Cardiovascular: Normal heart sounds.  Respiratory: Effort normal. No respiratory distress. She has no wheezes.  GI: Soft. She exhibits no distension. There is no abdominal tenderness.  Musculoskeletal:        General: Tenderness present.  Neurological: She is alert and oriented to person, place, and time.  Skin: Skin is warm and dry.  Psychiatric: She has a normal mood and affect.     Assessment/Plan L3-4, L4-5 stenosis  Proceed with LEFT TRANSFORAMINAL LUMBAR INTERBODY FUSION L3-4 WITH DEPUY MPACT PEDICLE SCREWS AND RODS AND CAGE L3-4, DECOMPRESSION/CENTRAL LAMINECTOMY L4-5 as scheduled.  Surgical procedure discussed in detail along with what is to be expected postop.  Patient will be needing short skilled nursing facility placement for rehab postop and would like to go to [facility] in [place].  Benjiman Core, PA-C 09/09/2018, 7:15 AM

## 2018-09-09 NOTE — Interval H&P Note (Signed)
History and Physical Interval Note:  09/09/2018 7:43 AM  Carly Larson  has presented today for surgery, with the diagnosis of spondylolisthesis L3-4, lumbar spinal stenosis L4-5, left L4 radiculopathy  The various methods of treatment have been discussed with the patient and family. After consideration of risks, benefits and other options for treatment, the patient has consented to  Procedure(s): LEFT TRANSFORAMINAL LUMBAR INTERBODY FUSION L3-4 WITH DEPUY MPACT PEDICLE SCREWS AND RODS AND CAGE L3-4, DECOMPRESSION/CENTRAL LAMINECTOMY L4-5 (N/A) as a surgical intervention .  The patient's history has been reviewed, patient examined, no change in status, stable for surgery.  I have reviewed the patient's chart and labs.  Questions were answered to the patient's satisfaction.     [name]

## 2018-09-09 NOTE — Discharge Instructions (Addendum)

## 2018-09-09 NOTE — Plan of Care (Signed)

## 2018-09-09 NOTE — Op Note (Signed)
09/09/2018  12:40 PM  PATIENT:  Carly Larson  68 y.o. female  MRN: [id number]  OPERATIVE REPORT  PRE-OPERATIVE DIAGNOSIS:  spondylolisthesis L3-4, lumbar spinal stenosis L4-5, left L4 radiculopathy  POST-OPERATIVE DIAGNOSIS:  spondylolisthesis L3-4, lumbar spinal stenosis L4-5, left L4   PROCEDURE:  Procedure(s): LEFT TRANSFORAMINAL LUMBAR INTERBODY FUSION L3-4 WITH DEPUY MPACT PEDICLE SCREWS AND RODS AND CAGE L3-4, DECOMPRESSION/CENTRAL LAMINECTOMY L4-5    SURGEON:  Jessy Oto, MD     ASSISTANT:  Benjiman Core, PA-C  (Present throughout the entire procedure and necessary for completion of procedure in a timely manner)     ANESTHESIA:  General,supplemented with local marcain and exparel, 159YV    COMPLICATIONS:  Two small less than 2 mm dural tears left L3-4, each repaired with one 4-0 neurolon suture and duraseal.    COMPONENTS:   Implant Name Type Inv. Item Serial No. Manufacturer Lot No. LRB No. Used  SCREW VIPER 7X45MM - [id number] Screw SCREW VIPER 7X45MM  [employer]  N/A 3  SCREW SET SINGLE INNER - [id number] Screw SCREW SET SINGLE INNER  [employer]  N/A 4  SCREW VIPER 7X50MM - [id number] Screw SCREW VIPER 7X50MM  [employer]  N/A 1  BONE VIVIGEN FORMABLE 5.4CC - [id number] Bone Implant BONE VIVIGEN FORMABLE 5.4CC [id number] [facility] [employer]  N/A 1  BONE CANC CHIPS 20CC - [id number] Bone Implant BONE [facility] CHIPS 20CC [id number] [employer]  N/A 1  SPACER CONCORDE LOR 9X10X27 5D - [id number] Spacer SPACER CONCORDE LOR 9X10X27 5D  [employer] [id number] N/A 1  Confidence High Viscosity Spinal Cement Cement   [employer] [id number] N/A 1  ROD PRE BENT EXP 40MM - [id number] Rod ROD PRE BENT EXP 40MM  [employer]  N/A 2  CONNECTOR CROSS SFX A2 - [id number] Connector CONNECTOR CROSS SFX A2  [employer]  N/A 1    PROCEDURE: The patient was met in the  holding area, and the appropriate lumbar levels left L4-5 and L3-4 identified and marked with an "X" and my initials. I had discussion with the patient in the preop holding area regarding a change of consent form.The fusion levels are reidentified as left L3-4. Patient understands the rationale to perform TLIF at L3-4  level to decompress the bilateral L4-5  lateral recess and foramenal stenosis. The patient was then transported to OR and was placed under general anestheticwithout difficulty. The patient received appropriate preoperative antibiotic prophylaxis 1 gm Ancef for negative nasal swab.  Nursing staff inserted a Foley catheter under sterile conditions. The patient was then turned to a prone position using the Newton spine frame. PAS. all pressure points well padded the arms at the side to 90 90. Standard prep with DuraPrep solution draped in the usual manner from the lower dorsal spine the mid sacral segment. Iodine Vi-Drape was used and the old incision scar was marked. Time-out procedure was called and correct. Skin in the midline between L2 and L5 was then infiltrated with local anesthesia, marcaine 1/2% 1:1 exparel 1.3% total 20 cc used. Incision was then made  extending from L3-L5  through the skin and subcutaneous layers down to the patient's lumbodorsal fascia and spinous processes. The incision then carried sharply excising the supraspinous ligament and then continuing the lateral aspect of the spinous processes of L2, L3, L4 and L5. Cobb elevator used to carefully elevate the paralumbar muscles off of the  posterior elements using electrocautery carefully drilled bleeding and perform dissection of the muscle tissues of the preserving the facet capsule at the L2-3. Continuing the exposure out laterally to expose the lateral margin of the facet joint line at L2-3, L3-4 and L4-5 .  C-arm fluoroscopy was then brought into the field and using C-arm fluoroscopy then a hole made into the medial  aspect of the pedicle of left L3 observed in the pedicle using C arm at the 5 oclock position on the left L3 pedicle nerve probe initial entry was determined on fluoroscopy to be good position alignment so that a 4.52m tap was passed to 45 mm within the left L3 pedicle to a depth of nearly 50 mm observed on C-arm fluoroscopy to be beyond the midpoint of the lumbar vertebra and then position alignment within the left L3 pedicle this was then removed and the pedicle channel probed demonstrating patency no sign of rupture the cortex of the pedicle. Tapping with a 4.5 mm screw tap then 5 mm tap then 6.020mtap and then a 7.0 mm tap, then 7.0 mm x 50 mm screw was placed on the table for later placement following left TLIF at L3-4 left side at the L3 level. C-arm fluoroscopy was then brought into the field and using C-arm fluoroscopy then a hole made into the posterior medial aspect of the pedicle of right L3 observed in the pedicle using ball tipped nerve hook and hockey stick nerve probe initial entry was determined on fluoroscopy to be good position alignment so that 4.6m26map was then used to tap the right L3 pedicle to a depth of nearly 40 mm observed on C-arm fluoroscopy to be beyond the midpoint of the lumbar vertebra and then position alignment within the right L3 pedicle this was then removed and the pedicle channel probed demonstrating patency no sign of rupture the cortex of the pedicle. Tapping with a 5 mm screw tap, then 6.0mm22mp and then a 7.0 mm tap, then 7.0 mm x 45 mm screw was placed on the right side at the L3 pedicle. C-arm fluoroscopy was then brought into the field and using C-arm fluoroscopy then a hole made into the posterior and medial aspect of the left pedicle of L4 observed in the pedicle using ball tipped nerve hook and hockey stick nerve probe initial entry was determined on fluoroscopy to be good position alignment so that a 4.0mm 57m was then used to tap the left L4 pedicle to a depth of  nearly 45 mm observed on C-arm fluoroscopy to be beyond the posterior one third of the lumbar vertebra and good position alignment within the left L4 pedicle this was then removed and the pedicle channel probed demonstrating patency no sign of rupture the cortex of the pedicle. Tapping with a 5 mm screw tap, then 6.0mm t6mand then a 7.0 mm tap, then 7.0 mm x 45 mm screw was placed on  5.0mm x 76mmm screw was placed on the left side at the L4 level. The pedicle channel of L5 on the left probed demonstrating patency no sign of rupture the cortex of the pedicle. Viper screw for fixation of this level was measured as 5.0 mm x 45 mm screw so  was placed on the table for later insertion on the left side at the L4 level following TLIF.  C-arm fluoroscopy was then brought into the field and using C-arm fluoroscopy then a hole made into the posterior and medial aspect of  the right pedicle of L4 observed in the pedicle using ball tipped nerve hook and hockey stick nerve probe initial entry was determined on fluoroscopy to be good position alignment so that a 4.1m tap was then used to tap the right L4 pedicle to a depth of nearly 45 mm observed on C-arm fluoroscopy to be beyond the posterior one third of the lumbar vertebra and good position alignment within the right L4 pedicle this was then removed and the pedicle channel probed demonstrating patency no sign of rupture the cortex of the pedicle. Tapping with a 5 mm screw, then 6.083mtap and then a 7.0 mm tap, then 7.0 mm x 45 mm screw was placed on the right side at the L4 level. The pedicle channel of L4 on the right probed demonstrating patency no sign of rupture the cortex of the pedicle. Viper screw for fixation of this level was measured as 7.0 mm x 45 mm screw left for insertion after decompression and TLIF. Flow Seal was used for hemostasis of the left L3 and L4 pedicle screw holes.  The bilateral inferior lamina of  L4 and right inferior lamina of L3 were then  resected by 50% at each segment the lower 50% of the spinous process of L2 was resected and Leksell rongeur used to resect inferior aspect of the lamina on the left side at the L4 level and partially on the left side at L3. The left and right medial 40% of the facet of L4-5 and 100% of left L3-4 were resected in order to decompress the left and right side of the lumbar thecal sac at  L4-5 and left L3-4 and decompress the left L3, L4 and L5 neuroforamen. Osteotomes and 63m64mnd 3mm463mrrisons were used for this portion of the decompression. The left side decompression was carried out with complete facetectomy performed on the right at L4-5 and L3-4 to provide for exposure of the left side L3-4 neuroforamen for ease of placement of TLIF (transforaminal lumbar interbody fusion) at the L3-4 level inferior portions of the left L3 lamina and pars were also resected first beginning with the Leksell rongeur and osteotomes and then resecting using 2 and 3 mm Kerrison. Continued laminectomy was carried out resecting the central portions of the lamina of L3and L4 performing foraminotomies on the left side at the L3 and L4. The inferior articular process L4 and L3 were resected on the left side. The L5 nerve root identified bilaterally and the medial aspect of the L5 pedicle. Superior articular process of L5 was then resected from the right side further decompressing the right L5 nerve and providing for exposure of the area just superior to the L4 pedicle decompressing left L3-4 for TLIF cage. A large amount of hypertrophic ligmentum flavum was found impressing on the bilateral lateral recesses at L4-5 and left L3-4 and narrowing the respective L3, L4 and L5 neuroforamen. Loupe magnification and headlight were used early portion during this portion procedure. The OR microscope sterilely draped and brought into the field to perform the bilateral lateral recess decompression L4-5 and left  Side TLIF at L3-4.  Attention then turned  to placement of the transforaminal lumbar interbody fusion cages. Using a Penfield 4 the left lateral aspect of the thecal sac at the L3-4 disc space was carefully freed up The thecal sac could then easily be retracted in the posterior lateral aspect of the L3-4 disc was exposed 15 blade scalpel used to incise the posterolateral disc and an osteotome  used to resect a small portion of bone off the superior aspect of the posterior superior vertebral body of L5 in order to ease the entry into the L3-4 disc space. A  7m kerrison rongeur was then able to be introduced in the disc space debrided it was quite narrow. 8 mm dilator was used to dialate the L3-4 disc space on the left side attempts were made to dilate further the 10 mm were successful and using small curettes and the disc space was debrided of moderate amount of degenerative disc present in the endplates debrided to bleeding endplate bone. Shavers were inserted to trial the intervertebral disc space. A lordotic 10 mm x 274mDepuy ProTi Concord cage was carefully packed with morcellized bone graft and the been harvested from previous laminotomies.The cage was then inserted with the articulating insertion handle.  Additional bone graft was then packed into the intervertebral disc space. Bleeding controlled using bipolar electrocautery thrombin soaked gel cottonoids. The posterior intervertebral disc space was then packed with autogenous local bone graft, vivigen and allograft cancellous bone graft. The local bone had been harvested from the left L3-4 and bilateral L4-5 laminectomies. Bleeding controlled using bipolar electrocautery.  Observed on C-arm fluoroscopy to be in good position alignment. The cages at  L3-4 were placed anteriorly as best as possible the correct patient's kyphosis that was present. With this then the transforaminal lumbar interbody fusion portion of the case was completed bleeders were controlled using bipolar electrocautery  thrombin-soaked Gelfoam were appropriate.Decortication of the facet joints carried out right L3-4. These were packed with cancellous local bone graft. 2 pedicle screws on the right L3 and L4 already in place, the 2 viper corticofixation screws on the left L3-4 were each placed and then each fastener carefully aligned  to allow for placement of rods. The left L4 pedicle had fracture through the superficial cortex superiorly so that cement augmentation carried out. Due to the softness of the patient's bone decision made to cement augment all four pedicle screws. This was done following verification of the pedicle screws inserted at L3 and L4. The insertion extensions first placed on the the left pedicle screws and the cement mixed and placed into the left L4 and L3 pedicle screws obtaining good filling o f the left L3 and L4 pedicle screws within the vertebral bodies no extravesation noted. The right pedicle screws similarly augmented with the cement obtaining excellent fixation. The right side precontoured [id number] placed and then the left. This was then placed into the pedicle screws on the left extending from L3-L4 each of the caps were carefully placed loosely tightened. Attention turned to the left side were similarly and then screws  caps onto the L3 fasteners were tightened to 80 foot lbs. Across the left side  L3-4 screw fasteners compression was obtained on the left side between L3 and L4, then L4 fastener tightening of the screw cap 85 pounds. Similarly this was done on the right side at L3-4 obtaining compression and tightened 85 pounds. An AA-2 crosslink measured and placed at the rod between each of the fasteners to further stabilize the construct. Two small dural tears noted left L3-4 each repaired with a single 4-0 neurolon suture, examined for leak and none present at 26m59m valsalva. Irrigation was carried out with copious amounts of saline solution this was done throughout the case. Cell Saver was  used during the case. Hockey stick neuroprobe was used to probe the neuroforamen bilateral L3, L4 and L5, these  were determined to be well decompressed. Permanent C-arm images were obtained in AP and lateral plane and oblique planes. Remaining local bone graft was then applied along both lateral posterior lateral region extending from L3 to L5 facet beds.Gelfoam was then removed spinal canal. Two small 10m repaired dural tears were evaluated and showed no CSF leakage, a duraseal was placed along the area of the left L3-4 The lumbodorsal musculature carefully exam debrided of any devitalized tissue following removal of Viper retractors were the bleeders were controlled using electrocautery and the area dorsal lumbar muscle were then approximated in the midline with interrupted #1 Vicryl sutures loose the dorsal fascia was reattached to the spinous process of L2 to superiorly and L5  inferiorly this was done with #1 Vicryl sutures. Subcutaneous layers then approximated using interrupted 0 Vicryl sutures and 2-0 Vicryl sutures. Skin was closed with a running subcutaneous stitch of 4-0 Vicryl Dermabond was applied then MedPlex bandage. All instrument and sponge counts were correct. The patient was then returned to a supine position on her bed reactivated extubated and returned to the recovery room in satisfactory condition.   JBenjiman CorePA-C perform the duties of assistant surgeon during this case.He was present from the beginning of the case to the end of the case assisting in transfer the patient from his stretcher to the OR table and back to the stretcher at the end of the case. Assisted in careful retraction and suction of the laminectomy site delicate neural structures operating under the operating room microscope. He performed closure of the incision from the fascia to the skin applying the dressing.         JBasil Dess 09/09/2018, 12:40 PM

## 2018-09-09 NOTE — Transfer of Care (Signed)
Immediate Anesthesia Transfer of Care Note  Patient: Carly Larson  Procedure(s) Performed: LEFT TRANSFORAMINAL LUMBAR INTERBODY FUSION L3-4 WITH DEPUY MPACT PEDICLE SCREWS AND RODS AND CAGE L3-4, DECOMPRESSION/CENTRAL LAMINECTOMY L4-5 (N/A )  Patient Location: PACU  Anesthesia Type:General  Level of Consciousness: Awake  Airway & Oxygen Therapy: Natural, 2 l O2 [place]  Post-op Assessment: Stable  Post vital signs: VSSAF  Last Vitals:  Vitals Value Taken Time  BP    Temp    Pulse 98 09/09/2018  2:31 PM  Resp 18 09/09/2018  2:31 PM  SpO2 90 % 09/09/2018  2:31 PM  Vitals shown include unvalidated device data.  Last Pain:  Vitals:   09/09/18 1415  TempSrc:   PainSc: 5       Patients Stated Pain Goal: 2 (55/00/16 4290)  Complications: No complications from anesthesia

## 2018-09-09 NOTE — Anesthesia Procedure Notes (Signed)
Procedure Name: Intubation Date/Time: 09/09/2018 7:47 AM Performed by: Scheryl Darter, CRNA Pre-anesthesia Checklist: Patient identified, Emergency Drugs available, Suction available and Patient being monitored Patient Re-evaluated:Patient Re-evaluated prior to induction Oxygen Delivery Method: Circle System Utilized Preoxygenation: Pre-oxygenation with 100% oxygen Induction Type: IV induction Ventilation: Mask ventilation without difficulty Laryngoscope Size: Miller and 2 Grade View: Grade I Tube type: Oral Tube size: 7.0 mm Number of attempts: 1 Airway Equipment and Method: Stylet and Oral airway Placement Confirmation: ETT inserted through vocal cords under direct vision,  positive ETCO2 and breath sounds checked- equal and bilateral Secured at: 22 cm Tube secured with: Tape Dental Injury: Teeth and Oropharynx as per pre-operative assessment

## 2018-09-10 DIAGNOSIS — M4316 Spondylolisthesis, lumbar region: Secondary | ICD-10-CM | POA: Diagnosis present

## 2018-09-10 DIAGNOSIS — D62 Acute posthemorrhagic anemia: Secondary | ICD-10-CM | POA: Diagnosis not present

## 2018-09-10 DIAGNOSIS — M48062 Spinal stenosis, lumbar region with neurogenic claudication: Secondary | ICD-10-CM | POA: Diagnosis present

## 2018-09-10 LAB — BASIC METABOLIC PANEL
Anion gap: 4 — ABNORMAL LOW (ref 5–15)
BUN: 10 mg/dL (ref 8–23)
CO2: 27 mmol/L (ref 22–32)
CREATININE: 0.84 mg/dL (ref 0.44–1.00)
Calcium: 8.3 mg/dL — ABNORMAL LOW (ref 8.9–10.3)
Chloride: 110 mmol/L (ref 98–111)
GFR calc Af Amer: >60 mL/min (ref >60)
GFR calc non Af Amer: >60 mL/min (ref >60)
Glucose, Bld: 97 mg/dL (ref 70–99)
Potassium: 4.2 mmol/L (ref 3.5–5.1)
SODIUM: 141 mmol/L (ref 135–145)

## 2018-09-10 LAB — CBC
HCT: 28.6 % — ABNORMAL LOW (ref 36.0–46.0)
Hemoglobin: 9.3 g/dL — ABNORMAL LOW (ref 12.0–15.0)
MCH: 32.5 pg (ref 26.0–34.0)
MCHC: 32.5 g/dL (ref 30.0–36.0)
MCV: 100 fL (ref 80.0–100.0)
Platelets: 190 10*3/uL (ref 150–400)
RBC: 2.86 MIL/uL — ABNORMAL LOW (ref 3.87–5.11)
RDW: 13.6 % (ref 11.5–15.5)
WBC: 13.6 10*3/uL — AB (ref 4.0–10.5)
nRBC: 0 % (ref 0.0–0.2)

## 2018-09-10 MED ORDER — PANTOPRAZOLE SODIUM 40 MG PO TBEC
40.0000 mg | DELAYED_RELEASE_TABLET | Freq: Every day | ORAL | Status: DC
  Administered 2018-09-10 – 2018-09-11 (×2): 40 mg via ORAL
  Filled 2018-09-10 (×2): qty 1

## 2018-09-10 NOTE — Progress Notes (Signed)
Physical Therapy Treatment Patient Details Name: Carly Larson MRN: [id number] DOB: 11/14/1950 Today's Date: 09/10/2018    History of Present Illness Patient 68 y/o female s/p PLIF L 3-L4 and decompression L4-5. Patient with tiny dural tear, however per MD notes, okay to mobilize. PMH includes memory loss, anxiety, chiari malformation, and osteopenia.     PT Comments    Patient seen for mobility progression. Pt is making progress toward PT goals and tolerated gait training well without c/o increased pain. This session pt ambulated with single UE support using IV pole with min A for balance using gait belt and assist to guide IV pole. Initially ambulated with RW however pt demonstrates unsafe use of AD.  Continue to progress as tolerated.   Follow Up Recommendations  SNF     Equipment Recommendations  Other (comment)(TBD at next venue)    Recommendations for Other Services       Precautions / Restrictions Precautions Precautions: Back;Fall Precaution Comments: pt needs cues for back precautions while mobilizing Required Braces or Orthoses: Spinal Brace Spinal Brace: Lumbar corset;Applied in sitting position    Mobility  Bed Mobility               General bed mobility comments: pt OOB in chair upon arrival  Transfers Overall transfer level: Needs assistance Equipment used: None Transfers: Sit to/from Stand Sit to Stand: Min guard         General transfer comment: min guard for safety  Ambulation/Gait Ambulation/Gait assistance: Min assist Gait Distance (Feet): (120) Assistive device: Rolling walker (2 wheeled) Gait Pattern/deviations: Step-through pattern;Decreased step length - right;Decreased step length - left;Decreased stride length Gait velocity: decreased   General Gait Details: initially ambulating with RW however pt demonstrates unsafe use of RW despite cues so most of session ambulated with use of gait belt and single UE on IV pole with assistance for  balance and guiding IV pole   Stairs             Wheelchair Mobility    Modified Rankin (Stroke Patients Only)       Balance Overall balance assessment: Needs assistance Sitting-balance support: No upper extremity supported;Feet supported Sitting balance-Leahy Scale: Good     Standing balance support: Single extremity supported Standing balance-Leahy Scale: Poor Standing balance comment: reliant on single UE support; LOB X 1 when performing hand hygiene at sink without UE support                             Cognition Arousal/Alertness: Awake/alert Behavior During Therapy: Impulsive Overall Cognitive Status: History of cognitive impairments - at baseline Area of Impairment: Memory;Safety/judgement                     Memory: Decreased short-term memory;Decreased recall of precautions   Safety/Judgement: Decreased awareness of safety            Exercises      General Comments        Pertinent Vitals/Pain Pain Assessment: Faces Faces Pain Scale: Hurts little more Pain Location: back(most pain with sitting upright) Pain Descriptors / Indicators: Discomfort;Grimacing;Guarding;Sore Pain Intervention(s): Limited activity within patient's tolerance;Monitored during session;Repositioned    Home Living                      Prior Function            PT Goals (current goals can now be found in  the care plan section) Acute Rehab PT Goals Patient Stated Goal: to decrease pain Progress towards PT goals: Progressing toward goals    Frequency    Min 5X/week      PT Plan Current plan remains appropriate    Co-evaluation              AM-PAC PT "6 Clicks" Mobility   Outcome Measure  Help needed turning from your back to your side while in a flat bed without using bedrails?: A Little Help needed moving from lying on your back to sitting on the side of a flat bed without using bedrails?: A Little Help needed moving to  and from a bed to a chair (including a wheelchair)?: A Little Help needed standing up from a chair using your arms (e.g., wheelchair or bedside chair)?: A Little Help needed to walk in hospital room?: A Little Help needed climbing 3-5 steps with a railing? : A Little 6 Click Score: 18    End of Session Equipment Utilized During Treatment: Gait belt;Back brace(aspen lumbar brace) Activity Tolerance: Patient tolerated treatment well Patient left: in chair;with call bell/phone within reach;with family/visitor present Nurse Communication: Mobility status PT Visit Diagnosis: Unsteadiness on feet (R26.81);Muscle weakness (generalized) (M62.81);Pain Pain - part of body: (back)     Time: 1040-1052 PT Time Calculation (min) (ACUTE ONLY): 12 min  Charges:  $Gait Training: 8-22 mins                     Earney Navy, PTA Acute Rehabilitation Services Pager: [phone] Office: [phone]     Darliss Cheney 09/10/2018, 11:13 AM

## 2018-09-10 NOTE — [place] FL2 (Signed)
[place] MEDICAID FL2 LEVEL OF CARE SCREENING TOOL     IDENTIFICATION  Patient Name: Carly Larson Birthdate: [birth year] Sex: female Admission Date (Current Location): 09/09/2018  [facility] and [place] Number:  Herbalist and Address:  [facility], [place] [address] [place], [place] [address]      Provider Number: [id number]  Attending Physician Name and Address:  Jessy Oto, MD  Relative Name and Phone Number:       Current Level of Care: Hospital Recommended Level of Care: [facility] Prior Approval Number:    Date Approved/Denied:   PASRR Number: pending  Discharge Plan: SNF    Current Diagnoses: Patient Active Problem List   Diagnosis Date Noted  . Spondylolisthesis, lumbar region 09/10/2018    Class: Chronic  . Spinal stenosis of lumbar region with neurogenic claudication 09/10/2018    Class: Chronic  . Anemia due to acute blood loss 09/10/2018  . Fusion of spine of lumbar region 09/09/2018  . Dementia ([place]) 12/23/2017  . Syncope 12/23/2017  . Heart murmur 12/23/2017  . Unilateral primary osteoarthritis, right knee 10/23/2017  . Unilateral primary osteoarthritis, left knee 09/23/2017  . Chronic pain of left knee 08/08/2017  . Chronic pain of right knee 08/08/2017  . Sciatica, left side 08/08/2017  . Memory loss 12/03/2013    Orientation RESPIRATION BLADDER Height & Weight     Time, Self, Situation, Place  Normal Continent Weight: 59 kg Height:  5' (152.4 cm)  BEHAVIORAL SYMPTOMS/MOOD NEUROLOGICAL BOWEL NUTRITION STATUS      Continent Diet(please see DC summary)  AMBULATORY STATUS COMMUNICATION OF NEEDS Skin   Limited Assist Verbally Surgical wounds(closed incision on back)                       Personal Care Assistance Level of Assistance  Bathing, Feeding, Dressing Bathing Assistance: Limited assistance Feeding assistance: Independent Dressing Assistance: Limited assistance     Functional  Limitations Info  Sight, Speech, Hearing Sight Info: Adequate Hearing Info: Adequate Speech Info: Adequate    SPECIAL CARE FACTORS FREQUENCY  PT (By licensed PT), OT (By licensed OT)     PT Frequency: 5x/week OT Frequency: 5x/week            Contractures Contractures Info: Not present    Additional Factors Info  Code Status, Allergies, Psychotropic Code Status Info: Full Allergies Info: No Known Allergies Psychotropic Info: aricept, namenda, remeron, effexor         Current Medications (09/10/2018):  This is the current hospital active medication list Current Facility-Administered Medications  Medication Dose Route Frequency Provider Last Rate Last Dose  . 0.9 %  sodium chloride infusion  250 mL Intravenous Continuous Jessy Oto, MD      . 0.9 %  sodium chloride infusion   Intravenous Continuous Jessy Oto, MD 100 mL/hr at 09/09/18 1606    . acetaminophen (TYLENOL) tablet 650 mg  650 mg Oral Q4H PRN Jessy Oto, MD       Or  . acetaminophen (TYLENOL) suppository 650 mg  650 mg Rectal Q4H PRN Jessy Oto, MD      . alum & mag hydroxide-simeth (MAALOX/MYLANTA) 200-200-20 MG/5ML suspension 30 mL  30 mL Oral Q6H PRN Jessy Oto, MD      . bisacodyl (DULCOLAX) EC tablet 5 mg  5 mg Oral Daily PRN Jessy Oto, MD      . celecoxib (CELEBREX) capsule  200 mg  200 mg Oral Q12H Jessy Oto, MD   200 mg at 09/10/18 1610  . cholecalciferol (VITAMIN D3) tablet 1,000 Units  1,000 Units Oral Daily Jessy Oto, MD   1,000 Units at 09/10/18 [phone]  . docusate sodium (COLACE) capsule 100 mg  100 mg Oral BID Jessy Oto, MD   100 mg at 09/10/18 5409  . donepezil (ARICEPT) tablet 23 mg  23 mg Oral QHS Jessy Oto, MD   23 mg at 09/09/18 2108  . gabapentin (NEURONTIN) capsule 300 mg  300 mg Oral BID Jessy Oto, MD   300 mg at 09/10/18 8119  . HYDROcodone-acetaminophen (NORCO) 10-325 MG per tablet 2 tablet  2 tablet Oral Q4H PRN Jessy Oto, MD      .  HYDROcodone-acetaminophen (NORCO) 7.5-325 MG per tablet 1 tablet  1 tablet Oral Q6H Jessy Oto, MD   1 tablet at 09/10/18 1311  . HYDROcodone-acetaminophen (NORCO) 7.5-325 MG per tablet 1 tablet  1 tablet Oral Q4H PRN Jessy Oto, MD   1 tablet at 09/10/18 [phone]  . loratadine (CLARITIN) tablet 10 mg  10 mg Oral Daily Jessy Oto, MD   10 mg at 09/10/18 0942  . memantine (NAMENDA) tablet 10 mg  10 mg Oral BID Jessy Oto, MD   10 mg at 09/10/18 0943  . menthol-cetylpyridinium (CEPACOL) lozenge 3 mg  1 lozenge Oral PRN Jessy Oto, MD       Or  . phenol (CHLORASEPTIC) mouth spray 1 spray  1 spray Mouth/Throat PRN Jessy Oto, MD      . methocarbamol (ROBAXIN) tablet 500 mg  500 mg Oral Q6H PRN Jessy Oto, MD   500 mg at 09/09/18 1418   Or  . methocarbamol (ROBAXIN) 500 mg in dextrose 5 % 50 mL IVPB  500 mg Intravenous Q6H PRN Jessy Oto, MD      . mirtazapine (REMERON) tablet 15 mg  15 mg Oral QHS Jessy Oto, MD   15 mg at 09/09/18 2100  . morphine 2 MG/ML injection 1 mg  1 mg Intravenous Q2H PRN Jessy Oto, MD      . ondansetron [facility]) tablet 4 mg  4 mg Oral Q6H PRN Jessy Oto, MD       Or  . ondansetron [facility]) injection 4 mg  4 mg Intravenous Q6H PRN Jessy Oto, MD      . pantoprazole (PROTONIX) EC tablet 40 mg  40 mg Oral QHS Jessy Oto, MD      . polyethylene glycol (MIRALAX / GLYCOLAX) packet 17 g  17 g Oral Daily PRN Jessy Oto, MD      . sodium chloride flush (NS) 0.9 % injection 3 mL  3 mL Intravenous Q12H Jessy Oto, MD      . sodium chloride flush (NS) 0.9 % injection 3 mL  3 mL Intravenous PRN Jessy Oto, MD      . sodium phosphate (FLEET) 7-19 GM/118ML enema 1 enema  1 enema Rectal Once PRN Jessy Oto, MD      . temazepam (RESTORIL) capsule 30 mg  30 mg Oral QHS Jessy Oto, MD   30 mg at 09/09/18 2101  . thiamine (VITAMIN B-1) tablet 100 mg  100 mg Oral Daily Jessy Oto, MD   100 mg at 09/10/18 0943  . traMADol  (ULTRAM) tablet 50 mg  50 mg Oral  Q6H PRN Jessy Oto, MD      . venlafaxine XR (EFFEXOR-XR) 24 hr capsule 150 mg  150 mg Oral BID Jessy Oto, MD   150 mg at 09/10/18 9106     Discharge Medications: Please see discharge summary for a list of discharge medications.  Relevant Imaging Results:  Relevant Lab Results:   Additional Information SSN: [id number]  Estanislado Emms, LCSW

## 2018-09-10 NOTE — Evaluation (Signed)
Occupational Therapy Evaluation Patient Details Name: Carly Larson MRN: [id number] DOB: 22-Jul-1951 Today's Date: 09/10/2018    History of Present Illness Patient 68 y/o female s/p PLIF L 3-L4 and decompression L4-5. Patient with tiny dural tear, however per MD notes, okay to mobilize. PMH includes memory loss, anxiety, chiari malformation, and osteopenia.    Clinical Impression   PTA Pt independent in ADL and mobility. Per daughter early dementia with some memory deficits. This was apparent as she required cues for back precautions, especially during functional grooming tasks at sink level (leans against sink for balance) and cues for compensatory strategies. Pt min assist for in room mobility, HHA and pushed IV pole. Pt max cues to don brace in sitting. Pt max A for LB ADL. OT will continue to follow acutely and afterwards at the SNF level to maximize safety and independence in ADL and functional transfers.     Follow Up Recommendations  SNF;Supervision/Assistance - 24 hour    Equipment Recommendations  Other (comment)(defer to next venue of care)    Recommendations for Other Services       Precautions / Restrictions Precautions Precautions: Back;Fall Precaution Booklet Issued: Yes (comment) Precaution Comments: pt needs cues for back precautions during functional activities Required Braces or Orthoses: Spinal Brace Spinal Brace: Lumbar corset;Applied in sitting position Restrictions Weight Bearing Restrictions: No      Mobility Bed Mobility Overal bed mobility: Needs Assistance Bed Mobility: Rolling;Sidelying to Sit Rolling: Min guard Sidelying to sit: Mod assist       General bed mobility comments: assist for trunk elevation, cues for sequencing and log rolling to maintain back precautions  Transfers Overall transfer level: Needs assistance Equipment used: None Transfers: Sit to/from Stand Sit to Stand: Min assist         General transfer comment: min assist for  safety, boost and balance    Balance Overall balance assessment: Needs assistance Sitting-balance support: No upper extremity supported;Feet supported Sitting balance-Leahy Scale: Good     Standing balance support: Single extremity supported Standing balance-Leahy Scale: Poor Standing balance comment: reliant on UE support                           ADL either performed or assessed with clinical judgement   ADL Overall ADL's : Needs assistance/impaired Eating/Feeding: Set up;Sitting   Grooming: Wash/dry hands;Wash/dry face;Oral care;Minimal assistance;Cueing for compensatory techniques;Standing Grooming Details (indicate cue type and reason): leans against sink, but able to perform grooming, cues for compensatory strategies to maintain back precautions.  Upper Body Bathing: Minimal assistance;Cueing for compensatory techniques Upper Body Bathing Details (indicate cue type and reason): requires sitting for safety Lower Body Bathing: Moderate assistance   Upper Body Dressing : Moderate assistance Upper Body Dressing Details (indicate cue type and reason): to don brace in sitting Lower Body Dressing: Maximal assistance;Sit to/from stand   Toilet Transfer: Minimal assistance;Ambulation Toilet Transfer Details (indicate cue type and reason): on person hand held assist Toileting- Clothing Manipulation and Hygiene: Maximal assistance;Sit to/from stand       Functional mobility during ADLs: Minimal assistance;Cueing for sequencing;Cueing for safety(IV pole) General ADL Comments: Pt with decreased safety awareness, decreased access to LB for ADL     Vision Baseline Vision/History: Wears glasses Wears Glasses: At all times Patient Visual Report: No change from baseline       Perception     Praxis      Pertinent Vitals/Pain Pain Assessment: Faces Faces Pain Scale:  Hurts little more Pain Location: back(most pain with sitting upright) Pain Descriptors / Indicators:  Discomfort;Grimacing;Guarding;Sore Pain Intervention(s): Limited activity within patient's tolerance;Monitored during session;Repositioned     Hand Dominance     Extremity/Trunk Assessment Upper Extremity Assessment Upper Extremity Assessment: Generalized weakness   Lower Extremity Assessment Lower Extremity Assessment: Defer to PT evaluation   Cervical / Trunk Assessment Cervical / Trunk Assessment: Other exceptions Cervical / Trunk Exceptions: s/p lumbar sx   Communication Communication Communication: No difficulties   Cognition Arousal/Alertness: Awake/alert Behavior During Therapy: Impulsive Overall Cognitive Status: History of cognitive impairments - at baseline Area of Impairment: Memory;Safety/judgement                     Memory: Decreased short-term memory;Decreased recall of precautions   Safety/Judgement: Decreased awareness of safety     General Comments: requires mod cues for safety with back precautions, balance, and safety awareness   General Comments       Exercises     Shoulder Instructions      Home Living Family/patient expects to be discharged to:: Skilled nursing facility                                        Prior Functioning/Environment Level of Independence: Independent                 OT Problem List: Decreased activity tolerance;Impaired balance (sitting and/or standing);Decreased safety awareness;Decreased knowledge of use of DME or AE;Decreased knowledge of precautions;Pain      OT Treatment/Interventions: Self-care/ADL training;DME and/or AE instruction;Therapeutic activities;Patient/family education;Balance training    OT Goals(Current goals can be found in the care plan section) Acute Rehab OT Goals Patient Stated Goal: to decrease pain OT Goal Formulation: With patient Time For Goal Achievement: 09/24/18 Potential to Achieve Goals: Good ADL Goals Pt Will Perform Grooming: with  supervision;standing Pt Will Perform Upper Body Dressing: with supervision;sitting Pt Will Perform Lower Body Dressing: with min guard assist;sit to/from stand Pt Will Transfer to Toilet: with supervision;ambulating Pt Will Perform Toileting - Clothing Manipulation and hygiene: with supervision;sit to/from stand Additional ADL Goal #1: Pt will recall 3/3 back precautions and maintain during ADL at min guard level  OT Frequency: Min 2X/week   Barriers to D/C:            Co-evaluation              AM-PAC OT "6 Clicks" Daily Activity     Outcome Measure Help from another person eating meals?: None Help from another person taking care of personal grooming?: A Little Help from another person toileting, which includes using toliet, bedpan, or urinal?: A Lot Help from another person bathing (including washing, rinsing, drying)?: A Lot Help from another person to put on and taking off regular upper body clothing?: A Lot Help from another person to put on and taking off regular lower body clothing?: A Lot 6 Click Score: 15   End of Session Equipment Utilized During Treatment: Gait belt;Back brace Nurse Communication: Mobility status;Precautions  Activity Tolerance: Patient tolerated treatment well Patient left: in chair;with call bell/phone within reach;with chair alarm set  OT Visit Diagnosis: Unsteadiness on feet (R26.81);Other abnormalities of gait and mobility (R26.89);Muscle weakness (generalized) (M62.81);Other symptoms and signs involving cognitive function;Pain Pain - part of body: (back)                Time:  0315-9458 OT Time Calculation (min): 25 min Charges:  OT General Charges $OT Visit: 1 Visit OT Evaluation $OT Eval Moderate Complexity: 1 Mod OT Treatments $Self Care/Home Management : 8-22 mins  Hulda Humphrey OTR/L Acute Rehabilitation Services Pager: [phone] Office: [phone]  Merri Ray Aliscia Clayton 09/10/2018, 3:48 PM

## 2018-09-10 NOTE — Plan of Care (Signed)

## 2018-09-10 NOTE — Plan of Care (Signed)
  Problem: Pain Managment: Goal: General experience of comfort will improve Outcome: Progressing   Problem: Safety: Goal: Ability to remain free from injury will improve Outcome: Progressing   

## 2018-09-10 NOTE — Progress Notes (Signed)
     Subjective: 1 Day Post-Op Procedure(s) (LRB): LEFT TRANSFORAMINAL LUMBAR INTERBODY FUSION L3-4 WITH DEPUY MPACT PEDICLE SCREWS AND RODS AND CAGE L3-4, DECOMPRESSION/CENTRAL LAMINECTOMY L4-5 (N/A) Mikld dementia, ice to incision intermittantly out of bed to recliner last night. Patient reports pain as moderate.    Objective:   VITALS:  Temp:  [97.3 F (36.3 C)-98.4 F (36.9 C)] 97.5 F (36.4 C) (02/19 0343) Pulse Rate:  [77-116] 77 (02/19 0343) Resp:  [18-26] 19 (02/19 0343) BP: (88-133)/(45-74) 96/49 (02/19 0343) SpO2:  [91 %-98 %] 95 % (02/19 0343)  Neurologically intact ABD soft Neurovascular intact Sensation intact distally Intact pulses distally Dorsiflexion/Plantar flexion intact Incision: scant drainage No cellulitis present Compartment soft   LABS Recent Labs    09/10/18 0316  HGB 9.3*  WBC 13.6*  PLT 190   Recent Labs    09/10/18 0316  NA 141  K 4.2  CL 110  CO2 27  BUN 10  CREATININE 0.84  GLUCOSE 97   No results for input(s): LABPT, INR in the last 72 hours.   Assessment/Plan: 1 Day Post-Op Procedure(s) (LRB): LEFT TRANSFORAMINAL LUMBAR INTERBODY FUSION L3-4 WITH DEPUY MPACT PEDICLE SCREWS AND RODS AND CAGE L3-4, DECOMPRESSION/CENTRAL LAMINECTOMY L4-5 (N/A)   Advance diet Up with therapy  IV fluids NS at 100 cc per hour.  [name] 09/10/2018, 8:24 AMPatient ID: Carly Larson, female   DOB: 1950/12/10, 68 y.o.   MRN: [id number]

## 2018-09-11 ENCOUNTER — Encounter (HOSPITAL_COMMUNITY): Payer: Self-pay | Admitting: General Practice

## 2018-09-11 LAB — BASIC METABOLIC PANEL
Anion gap: 6 (ref 5–15)
BUN: 12 mg/dL (ref 8–23)
CO2: 24 mmol/L (ref 22–32)
Calcium: 8.2 mg/dL — ABNORMAL LOW (ref 8.9–10.3)
Chloride: 108 mmol/L (ref 98–111)
Creatinine, Ser: 0.82 mg/dL (ref 0.44–1.00)
GFR calc Af Amer: >60 mL/min (ref >60)
GFR calc non Af Amer: >60 mL/min (ref >60)
Glucose, Bld: 93 mg/dL (ref 70–99)
Potassium: 3.9 mmol/L (ref 3.5–5.1)
Sodium: 138 mmol/L (ref 135–145)

## 2018-09-11 LAB — CBC WITH DIFFERENTIAL/PLATELET
Abs Immature Granulocytes: 0.06 10*3/uL (ref 0.00–0.07)
Basophils Absolute: 0 10*3/uL (ref 0.0–0.1)
Basophils Relative: 0 %
Eosinophils Absolute: 0.2 10*3/uL (ref 0.0–0.5)
Eosinophils Relative: 1 %
HCT: 30.3 % — ABNORMAL LOW (ref 36.0–46.0)
Hemoglobin: 9.4 g/dL — ABNORMAL LOW (ref 12.0–15.0)
Immature Granulocytes: 1 %
Lymphocytes Relative: 18 %
Lymphs Abs: 2.1 10*3/uL (ref 0.7–4.0)
MCH: 31.5 pg (ref 26.0–34.0)
MCHC: 31 g/dL (ref 30.0–36.0)
MCV: 101.7 fL — ABNORMAL HIGH (ref 80.0–100.0)
Monocytes Absolute: 1.1 10*3/uL — ABNORMAL HIGH (ref 0.1–1.0)
Monocytes Relative: 10 %
NRBC: 0 % (ref 0.0–0.2)
Neutro Abs: 8.2 10*3/uL — ABNORMAL HIGH (ref 1.7–7.7)
Neutrophils Relative %: 70 %
Platelets: 173 10*3/uL (ref 150–400)
RBC: 2.98 MIL/uL — ABNORMAL LOW (ref 3.87–5.11)
RDW: 14 % (ref 11.5–15.5)
WBC: 11.6 10*3/uL — AB (ref 4.0–10.5)

## 2018-09-11 MED FILL — Sodium Chloride IV Soln 0.9%: INTRAVENOUS | Qty: 1000 | Status: AC

## 2018-09-11 MED FILL — Heparin Sodium (Porcine) Inj 1000 Unit/ML: INTRAMUSCULAR | Qty: 30 | Status: AC

## 2018-09-11 NOTE — Consult Note (Signed)
[facility] CM Primary Care Navigator  09/11/2018  Carly Larson Nov 06, 1950 [id number]   Met withpatientand husband (Lynn)at the bedside to identify possible discharge needs.  Patient reports that she had been having worsening back pain radiating to both legs and was found with lumbar spondylolisthesis L3-4 with spinal stenosis, L3-4 and L4-5, underwent left transforaminal lumbar interbody fusion and decompression/ laminectomy.    Patientendorses Cyndi Bender with [place] [facility] provider.   [facility] [place] [facility] without difficulty.   Patient states managing her medications at home with husband's assistance.  Her husband has been providing transportation to her doctors'appointments.  According to patient, husband is her primary caregiver at home.  Anticipated discharge planisskilled nursing facility (SNF- in process) per therapy recommendation for rehabilitation.  Patient and husband voiced understanding to call primary care provider's office when she returns back home, for a post discharge follow-up within1- 2 weeks or sooner if needs arise. Patient letter (with PCP's contact number) was provided to asareminder.   Discussed with patient and husband regarding [facility] services available for health managementand resources at home, but both indicated that no services are currently needed at this point.  Patient and husband verbalizedunderstandingto seekreferralfrom primary care provider to [facility] care management if deemed necessary and appropriate for any services in the future- oncepatient returns to home.  [facility] care management information was provided for future needsthat patient may have.  Primary care provider's office is listed as providing transition of care (TOC).   For additional questions please contact:  Edwena Felty A. Danijah Noh, BSN, RN-BC [facility]  PRIMARY CARE Navigator Cell: [phone]

## 2018-09-11 NOTE — Progress Notes (Signed)
Physical Therapy Treatment Patient Details Name: Carly Larson MRN: [id number] DOB: 1950/09/21 Today's Date: 09/11/2018    History of Present Illness Patient 68 y/o female s/p PLIF L 3-L4 and decompression L4-5. Patient with tiny dural tear, however per MD notes, okay to mobilize. PMH includes memory loss, anxiety, chiari malformation, and osteopenia.     PT Comments    Patient seen for mobility progression. Pt continues to require min/mod A for OOB mobility. Cues to maintain precautions needed throughout session. Continue to progress as tolerated with anticipated d/c to SNF for further skilled PT services.      Follow Up Recommendations  SNF     Equipment Recommendations  Other (comment)(TBD at next venue)    Recommendations for Other Services       Precautions / Restrictions Precautions Precautions: Back;Fall Precaution Comments: precautions reviewed with pt; pt unable to recall precautions  Required Braces or Orthoses: Spinal Brace Spinal Brace: Lumbar corset;Applied in sitting position Restrictions Weight Bearing Restrictions: No    Mobility  Bed Mobility               General bed mobility comments: pt OOB in chair upon arrival  Transfers Overall transfer level: Needs assistance Equipment used: None Transfers: Sit to/from Stand Sit to Stand: Min assist         General transfer comment: assist for balance   Ambulation/Gait Ambulation/Gait assistance: Min assist;Mod assist Gait Distance (Feet): 140 Feet Assistive device: 1 person hand held assist Gait Pattern/deviations: Step-through pattern;Decreased stride length;Drifts right/left Gait velocity: decreased   General Gait Details: assistance required for balance   Stairs             Wheelchair Mobility    Modified Rankin (Stroke Patients Only)       Balance Overall balance assessment: Needs assistance Sitting-balance support: No upper extremity supported;Feet supported Sitting  balance-Leahy Scale: Good     Standing balance support: Single extremity supported Standing balance-Leahy Scale: Poor Standing balance comment: reliant on UE support                            Cognition Arousal/Alertness: Awake/alert Behavior During Therapy: Impulsive Overall Cognitive Status: History of cognitive impairments - at baseline Area of Impairment: Memory;Safety/judgement                     Memory: Decreased short-term memory;Decreased recall of precautions   Safety/Judgement: Decreased awareness of safety            Exercises      General Comments        Pertinent Vitals/Pain Pain Assessment: Faces Faces Pain Scale: Hurts little more Pain Location: back(most pain with sitting upright) Pain Descriptors / Indicators: Guarding;Sore Pain Intervention(s): Limited activity within patient's tolerance;Monitored during session;Repositioned    Home Living                      Prior Function            PT Goals (current goals can now be found in the care plan section) Acute Rehab PT Goals Patient Stated Goal: to decrease pain Progress towards PT goals: Progressing toward goals    Frequency    Min 5X/week      PT Plan Current plan remains appropriate    Co-evaluation              AM-PAC PT "6 Clicks" Mobility   Outcome Measure  Help needed turning from your back to your side while in a flat bed without using bedrails?: A Little Help needed moving from lying on your back to sitting on the side of a flat bed without using bedrails?: A Little Help needed moving to and from a bed to a chair (including a wheelchair)?: A Little Help needed standing up from a chair using your arms (e.g., wheelchair or bedside chair)?: A Little Help needed to walk in hospital room?: A Little Help needed climbing 3-5 steps with a railing? : A Little 6 Click Score: 18    End of Session Equipment Utilized During Treatment: Gait belt;Back  brace(aspen lumbar brace) Activity Tolerance: Patient tolerated treatment well Patient left: in chair;with call bell/phone within reach;with family/visitor present;with chair alarm set Nurse Communication: Mobility status PT Visit Diagnosis: Unsteadiness on feet (R26.81);Muscle weakness (generalized) (M62.81);Pain Pain - part of body: (back)     Time: 2633-3545 PT Time Calculation (min) (ACUTE ONLY): 21 min  Charges:  $Gait Training: 8-22 mins                     Earney Navy, PTA Acute Rehabilitation Services Pager: [phone] Office: [phone]     Darliss Cheney 09/11/2018, 11:36 AM

## 2018-09-11 NOTE — Clinical Social Work Note (Signed)
Clinical Social Work Assessment  Patient Details  Name: Carly Larson MRN: [id number] Date of Birth: 07-21-1951  Date of referral:  09/11/18               Reason for consult:  Discharge Planning                Permission sought to share information with:  Case Manager, Facility Sport and exercise psychologist, Family Supports Permission granted to share information::     Name::     Conservation officer, historic buildings::  SNFs  Relationship::  daughter  Contact Information:  [phone]  Housing/Transportation Living arrangements for the past 2 months:  [place] of Information:  Patient Patient Interpreter Needed:  None Criminal Activity/Legal Involvement Pertinent to Current Situation/Hospitalization:  No - Comment as needed Significant Relationships:  Adult Children Lives with:  Self Do you feel safe going back to the place where you live?  No Need for family participation in patient care:  Yes (Comment)  Care giving concerns:  CSW received referral for possible SNF placement at time of discharge. Spoke with patient regarding possibility of SNF placement . Patient's  Family and daughter Carly Larson at bedside  Are currently unable to care for her at their home given patient's current needs and fall risk.  Patient and daughter Carly Larson at bedside expressed understanding of PT recommendation and are agreeable to SNF placement at time of discharge. CSW to continue to follow and assist with discharge planning needs.    Social Worker assessment / plan:  Spoke with patient and daughter Carly Larson at bedside concerning possibility of rehab at [facility] before returning home.    Employment status:  Retired Forensic scientist:  Medicare PT Recommendations:  [place] / Referral to community resources:  [place]  Patient/Family's Response to care:  Patient and daughter Carly Larson at bedside recognize need for rehab before returning home and are agreeable to a SNF in [place]. They  report preference for [facility]      . CSW explained insurance authorization process. Patient's family reported that they want patient to get stronger to be able to come back home.    Patient/Family's Understanding of and Emotional Response to Diagnosis, Current Treatment, and Prognosis:  Patient/family is realistic regarding therapy needs and expressed being hopeful for SNF placement. Patient expressed understanding of CSW role and discharge process as well as medical condition. No questions/concerns about plan or treatment.    Emotional Assessment Appearance:  Appears stated age Attitude/Demeanor/Rapport:  Gracious Affect (typically observed):  Accepting Orientation:  Oriented to Self, Oriented to Place, Oriented to  Time, Oriented to Situation Alcohol / Substance use:  Not Applicable Psych involvement (Current and /or in the community):  No (Comment)  Discharge Needs  Concerns to be addressed:  Discharge Planning Concerns Readmission within the last 30 days:  No Current discharge risk:  Dependent with Mobility Barriers to Discharge:  Continued Medical Work up   [employer], LCSW 09/11/2018, 10:16 AM

## 2018-09-11 NOTE — Progress Notes (Signed)
     Subjective: 2 Days Post-Op Procedure(s) (LRB): LEFT TRANSFORAMINAL LUMBAR INTERBODY FUSION L3-4 WITH DEPUY MPACT PEDICLE SCREWS AND RODS AND CAGE L3-4, DECOMPRESSION/CENTRAL LAMINECTOMY L4-5 (N/A) Awake, alert and oriented x 3. She has some mild sundowning.   Patient reports pain as moderate.    Objective:   VITALS:  Temp:  [97.7 F (36.5 C)-98.1 F (36.7 C)] 98 F (36.7 C) (02/20 0309) Pulse Rate:  [88-105] 93 (02/20 0309) Resp:  [17-18] 18 (02/20 0309) BP: (115-136)/(55-71) 115/55 (02/20 0309) SpO2:  [97 %-98 %] 98 % (02/20 0309)  Neurologically intact ABD soft Neurovascular intact Sensation intact distally Intact pulses distally Dorsiflexion/Plantar flexion intact Incision: dressing C/D/I and no drainage No cellulitis present   LABS Recent Labs    09/10/18 0316 09/11/18 0504  HGB 9.3* 9.4*  WBC 13.6* 11.6*  PLT 190 173   Recent Labs    09/10/18 0316 09/11/18 0504  NA 141 138  K 4.2 3.9  CL 110 108  CO2 27 24  BUN 10 12  CREATININE 0.84 0.82  GLUCOSE 97 93   No results for input(s): LABPT, INR in the last 72 hours.   Assessment/Plan: 2 Days Post-Op Procedure(s) (LRB): LEFT TRANSFORAMINAL LUMBAR INTERBODY FUSION L3-4 WITH DEPUY MPACT PEDICLE SCREWS AND RODS AND CAGE L3-4, DECOMPRESSION/CENTRAL LAMINECTOMY L4-5 (N/A)  Advance diet Up with therapy D/C IV fluids Plan for discharge tomorrow FL-2 form signed  [name] 09/11/2018, 9:41 AMPatient ID: Rockney Ghee, female   DOB: [birth year], 68 y.o.   MRN: [id number]

## 2018-09-11 NOTE — Plan of Care (Signed)
  Problem: Pain Managment: Goal: General experience of comfort will improve Outcome: Progressing   

## 2018-09-11 NOTE — Discharge Summary (Signed)
Physician Discharge Summary      Patient ID: Carly Larson MRN: [id number] DOB/AGE: 1950/11/13 68 y.o.  Admit date: 09/09/2018 Discharge date:   Admission Diagnoses:  Active Problems:   Spondylolisthesis, lumbar region   Spinal stenosis of lumbar region with neurogenic claudication   Anemia due to acute blood loss   Fusion of spine of lumbar region   Discharge Diagnoses:  Same  Past Medical History:  Diagnosis Date  . Anxiety state, unspecified   . Arthritis   . Carpal tunnel syndrome    left  . Chiari malformation type I ([place])   . Depression   . Disorder of bone and cartilage, unspecified   . Encounter for long-term (current) use of other medications   . Esophageal reflux   . Headache   . High risk medication use   . Insomnia   . Memory loss    MMSE 24/30 on 12/05/11, 30/30 on 10/25/13  . Memory loss   . Menopausal symptoms   . Osteopenia    Hx of vitamin D deficiency  . Osteopenia   . Symptomatic menopausal or female climacteric states   . White matter abnormality on MRI of brain     Surgeries: Procedure(s): LEFT TRANSFORAMINAL LUMBAR INTERBODY FUSION L3-4 WITH DEPUY MPACT PEDICLE SCREWS AND RODS AND CAGE L3-4, DECOMPRESSION/CENTRAL LAMINECTOMY L4-5 on 09/09/2018   Consultants:   Discharged Condition: Improved  Hospital Course: Carly Larson is an 68 y.o. female who was admitted 09/09/2018 with a chief complaint of No chief complaint on file. , and found to have a diagnosis of lumbar spondylolisthesis L3-4 with spinal stenosis, L3-4 and L4-5.  She was brought to the operating room on 09/09/2018 and underwent the above named procedures.    She was given perioperative antibiotics:  Anti-infectives (From admission, onward)   Start     Dose/Rate Route Frequency Ordered Stop   09/09/18 1630  ceFAZolin (ANCEF) IVPB 1 g/50 mL premix     1 g 100 mL/hr over 30 Minutes Intravenous Every 8 hours 09/09/18 1447 09/09/18 2353   09/09/18 0600  ceFAZolin (ANCEF) IVPB 2g/100  mL premix     2 g 200 mL/hr over 30 Minutes Intravenous On call to O.R. 09/09/18 0510 09/09/18 0813    Recovered in the PACU uneventfully and was transferred to [place] floor [place]. She was up and out of bed with assistance the evening of surgery and had moderate discomfort which responded to oral and IV narcotics. She has some baseline mild dementia. POD#1 afebrile VSS, Foley discontinued and she was able to void spontaneously normally. Hgb decreased to 9.8, BMET was normal. Started on ferrous gluconate. IVF continued. Dressing with scant drainage. PT and OT evaluated and a SNF referral for continued post op rehabilitation following major lumbar surgery. POD#2  VSS, Hgb 9.2. Legs are N-V normal, she is afebrile. Social service evaluated and she is being scheduled for SNF placement for post hospitalization rehabilitation. Incision is dry, dressing changed. She is using LSO while out of bed. No head aches, no nuchal signs. Hgb is nearly stable. Family and patient discussed plan for SNF post hospitalization. She has some mild dementia. Due to intraop identification of weak bone a Vitamin D level also obtained. BMET remained normal. IVF discontinued and she was seen by PT and OT with short distance in room ambulation. PT and OT concur with plan for SNF post hospitalization. Family and social service plan for transfer to SNF on 09/12/2018 to [facility]  for continued work on improving her standing and walking and independence. She does need care in observation to prevent her from trying to get up without assistance. POD#3 awake, alert and oriented x 3. Tolerating po narcotics and nourishment. No complaints, no numbness or weakness. Baseline mild dementia, carefull  Observation as she may try to get up without help and need stand by assistance in walking to ensure safety precautions.    She was given sequential compression devices and early ambulationfor DVT prophylaxis.  She benefited maximally from  their hospital stay and there were no complications.    Recent vital signs:  Vitals:   09/11/18 1347 09/12/18 0442  BP: (!) 122/48 (!) 111/55  Pulse: 90 83  Resp: 17 20  Temp: 98.7 F (37.1 C) 97.6 F (36.4 C)  SpO2: 98% 96%    Recent laboratory studies:  Results for orders placed or performed during the hospital encounter of 09/09/18  CBC  Result Value Ref Range   WBC 13.6 (H) 4.0 - 10.5 K/uL   RBC 2.86 (L) 3.87 - 5.11 MIL/uL   Hemoglobin 9.3 (L) 12.0 - 15.0 g/dL   HCT 28.6 (L) 36.0 - 46.0 %   MCV 100.0 80.0 - 100.0 fL   MCH 32.5 26.0 - 34.0 pg   MCHC 32.5 30.0 - 36.0 g/dL   RDW 13.6 11.5 - 15.5 %   Platelets 190 150 - 400 K/uL   nRBC 0.0 0.0 - 0.2 %  Basic Metabolic Panel  Result Value Ref Range   Sodium 141 135 - 145 mmol/L   Potassium 4.2 3.5 - 5.1 mmol/L   Chloride 110 98 - 111 mmol/L   CO2 27 22 - 32 mmol/L   Glucose, Bld 97 70 - 99 mg/dL   BUN 10 8 - 23 mg/dL   Creatinine, Ser 0.84 0.44 - 1.00 mg/dL   Calcium 8.3 (L) 8.9 - 10.3 mg/dL   GFR calc non Af Amer >60 >60 mL/min   GFR calc Af Amer >60 >60 mL/min   Anion gap 4 (L) 5 - 15  CBC with Differential/Platelet  Result Value Ref Range   WBC 11.6 (H) 4.0 - 10.5 K/uL   RBC 2.98 (L) 3.87 - 5.11 MIL/uL   Hemoglobin 9.4 (L) 12.0 - 15.0 g/dL   HCT 30.3 (L) 36.0 - 46.0 %   MCV 101.7 (H) 80.0 - 100.0 fL   MCH 31.5 26.0 - 34.0 pg   MCHC 31.0 30.0 - 36.0 g/dL   RDW 14.0 11.5 - 15.5 %   Platelets 173 150 - 400 K/uL   nRBC 0.0 0.0 - 0.2 %   Neutrophils Relative % 70 %   Neutro Abs 8.2 (H) 1.7 - 7.7 K/uL   Lymphocytes Relative 18 %   Lymphs Abs 2.1 0.7 - 4.0 K/uL   Monocytes Relative 10 %   Monocytes Absolute 1.1 (H) 0.1 - 1.0 K/uL   Eosinophils Relative 1 %   Eosinophils Absolute 0.2 0.0 - 0.5 K/uL   Basophils Relative 0 %   Basophils Absolute 0.0 0.0 - 0.1 K/uL   Immature Granulocytes 1 %   Abs Immature Granulocytes 0.06 0.00 - 0.07 K/uL  Basic metabolic panel  Result Value Ref Range   Sodium 138 135 -  145 mmol/L   Potassium 3.9 3.5 - 5.1 mmol/L   Chloride 108 98 - 111 mmol/L   CO2 24 22 - 32 mmol/L   Glucose, Bld 93 70 - 99 mg/dL   BUN 12 8 -  23 mg/dL   Creatinine, Ser 0.82 0.44 - 1.00 mg/dL   Calcium 8.2 (L) 8.9 - 10.3 mg/dL   GFR calc non Af Amer >60 >60 mL/min   GFR calc Af Amer >60 >60 mL/min   Anion gap 6 5 - 15  VITAMIN D 25 Hydroxy (Vit-D Deficiency, Fractures)  Result Value Ref Range   Vit D, 25-Hydroxy 41.3 30.0 - 100.0 ng/mL    Discharge Medications:   Allergies as of 09/12/2018   No Known Allergies     Medication List    STOP taking these medications   alendronate 70 MG tablet Commonly known as:  FOSAMAX   gabapentin 300 MG capsule Commonly known as:  NEURONTIN   HYDROcodone-acetaminophen 5-325 MG tablet Commonly known as:  NORCO/VICODIN Replaced by:  HYDROcodone-acetaminophen 7.5-325 MG tablet   meloxicam 15 MG tablet Commonly known as:  MOBIC   Vitamin D-3 25 MCG (1000 UT) Caps Replaced by:  cholecalciferol 25 MCG (1000 UT) tablet     TAKE these medications   CAL MAG ZINC +D3 PO Take 1 tablet by mouth daily.   celecoxib 200 MG capsule Commonly known as:  CELEBREX Take 1 capsule (200 mg total) by mouth daily.   cholecalciferol 25 MCG (1000 UT) tablet Commonly known as:  VITAMIN D3 Take 1 tablet (1,000 Units total) by mouth daily. Start taking on:  September 13, 2018 Replaces:  Vitamin D-3 25 MCG (1000 UT) Caps   docusate sodium 100 MG capsule Commonly known as:  COLACE Take 1 capsule (100 mg total) by mouth 2 (two) times daily.   donepezil 23 MG Tabs tablet Commonly known as:  ARICEPT Take 23 mg by mouth at bedtime.   Glucosamine Chondroitin Triple Tabs Take 1 tablet by mouth daily.   HYDROcodone-acetaminophen 7.5-325 MG tablet Commonly known as:  NORCO Take 1-2 tablets by mouth every 4 (four) hours as needed for moderate pain. Replaces:  HYDROcodone-acetaminophen 5-325 MG tablet   loratadine 10 MG tablet Commonly known as:   CLARITIN Take 10 mg by mouth daily.   memantine 10 MG tablet Commonly known as:  NAMENDA Take 10 mg by mouth 2 (two) times daily.   methocarbamol 500 MG tablet Commonly known as:  ROBAXIN Take 1 tablet (500 mg total) by mouth every 8 (eight) hours as needed for muscle spasms.   mirtazapine 15 MG tablet Commonly known as:  REMERON Take 15 mg by mouth at bedtime.   multivitamin capsule Take 1 capsule by mouth daily.   ondansetron 4 MG tablet Commonly known as:  ZOFRAN Take 1 tablet (4 mg total) by mouth every 6 (six) hours as needed for nausea or vomiting.   Potassium 99 MG Tabs Take 99 mg by mouth daily.   SUPER B COMPLEX/C PO Take 1 tablet by mouth daily.   temazepam 30 MG capsule Commonly known as:  RESTORIL Take 30 mg by mouth at bedtime.   thiamine 100 MG tablet Commonly known as:  VITAMIN B-1 Take 100 mg by mouth daily.   traMADol 50 MG tablet Commonly known as:  ULTRAM Take 1 tablet (50 mg total) by mouth every 6 (six) hours as needed for up to 7 days. What changed:  reasons to take this   venlafaxine XR 150 MG 24 hr capsule Commonly known as:  EFFEXOR-XR Take 150 mg by mouth 2 (two) times daily.            Durable Medical Equipment  (From admission, onward)  Start     Ordered   09/09/18 1448  DME 3 n 1  Once     09/09/18 1447   09/09/18 1448  DME Walker rolling  Once    Question:  Patient needs a walker to treat with the following condition  Answer:  Fusion of spine of lumbar region   09/09/18 1447          Diagnostic Studies: Dg Chest 2 View  Result Date: 09/04/2018 CLINICAL DATA:  Preop for lumbar fusion. EXAM: CHEST - 2 VIEW COMPARISON:  None. FINDINGS: The heart size and mediastinal contours are within normal limits. Both lungs are clear. The visualized skeletal structures are unremarkable. IMPRESSION: No active cardiopulmonary disease. Electronically Signed   By: Marijo Conception, M.D.   On: 09/04/2018 21:56   Dg Lumbar Spine  Complete  Result Date: 09/09/2018 CLINICAL DATA:  68 year old female with lumbar surgery EXAM: DG C-ARM 61-120 MIN; LUMBAR SPINE - COMPLETE 4+ VIEW COMPARISON:  None. FINDINGS: Limited intraoperative fluoroscopic spot images of lumbar spine during posterior lumbar interbody fusion with bilateral pedicle screw and rod fixation and cross fixation of L3-L4. IMPRESSION: Limited intraoperative fluoroscopic spot images of L3-L4 posterior lumbar interbody fusion with bilateral pedicle screw and rod fixation. Please refer to the dictated operative report for full details of intraoperative findings and procedure. Electronically Signed   By: Corrie Mckusick D.O.   On: 09/09/2018 14:11   Dg C-arm 1-60 Min  Result Date: 09/09/2018 CLINICAL DATA:  68 year old female with lumbar surgery EXAM: DG C-ARM 61-120 MIN; LUMBAR SPINE - COMPLETE 4+ VIEW COMPARISON:  None. FINDINGS: Limited intraoperative fluoroscopic spot images of lumbar spine during posterior lumbar interbody fusion with bilateral pedicle screw and rod fixation and cross fixation of L3-L4. IMPRESSION: Limited intraoperative fluoroscopic spot images of L3-L4 posterior lumbar interbody fusion with bilateral pedicle screw and rod fixation. Please refer to the dictated operative report for full details of intraoperative findings and procedure. Electronically Signed   By: Corrie Mckusick D.O.   On: 09/09/2018 14:11    Disposition: Discharge disposition: 03-Skilled Nursing Facility       Discharge Instructions    Call MD / Call 911   Complete by:  As directed    If you experience chest pain or shortness of breath, CALL 911 and be transported to the hospital emergency room.  If you develope a fever above 101 F, pus (white drainage) or increased drainage or redness at the wound, or calf pain, call your surgeon's office.   Constipation Prevention   Complete by:  As directed    Drink plenty of fluids.  Prune juice may be helpful.  You may use a stool softener,  such as Colace (over the counter) 100 mg twice a day.  Use MiraLax (over the counter) for constipation as needed.   Diet - low sodium heart healthy   Complete by:  As directed    Discharge instructions   Complete by:  As directed    Call if there is increasing drainage, fever greater than 101.5, severe head aches, and worsening nausea or light sensitivity. If shortness of breath, bloody cough or chest tightness or pain go to an emergency room. No lifting greater than 10 lbs. Avoid bending, stooping and twisting. Use brace when sitting and out of bed even to go to bathroom. Walk in house for first 2 weeks then may start to get out slowly increasing distances up to one mile by 4-6 weeks post op. After 5 days  may shower and change dressing following bathing with shower.When bathing remove the brace shower and replace brace before getting out of the shower. If drainage, keep dry dressing and do not bathe the incision, use an moisture impervious dressing. Please call and return for scheduled follow up appointment 2 weeks from the time of surgery.   Increase activity slowly as tolerated   Complete by:  As directed       Follow-up Information    Jessy Oto, MD In 2 weeks.   Specialty:  Orthopedic Surgery Why:  For wound re-check Contact information: [place] [address] [phone]            Signed: [name] 09/12/2018, 9:35 AM

## 2018-09-11 NOTE — Plan of Care (Signed)

## 2018-09-11 NOTE — Plan of Care (Signed)
  Problem: Pain Managment: Goal: General experience of comfort will improve Outcome: Progressing   Problem: Safety: Goal: Ability to remain free from injury will improve Outcome: Progressing   

## 2018-09-12 DIAGNOSIS — Z419 Encounter for procedure for purposes other than remedying health state, unspecified: Secondary | ICD-10-CM | POA: Diagnosis not present

## 2018-09-12 DIAGNOSIS — G5602 Carpal tunnel syndrome, left upper limb: Secondary | ICD-10-CM | POA: Diagnosis not present

## 2018-09-12 DIAGNOSIS — F419 Anxiety disorder, unspecified: Secondary | ICD-10-CM | POA: Diagnosis not present

## 2018-09-12 DIAGNOSIS — E46 Unspecified protein-calorie malnutrition: Secondary | ICD-10-CM | POA: Diagnosis not present

## 2018-09-12 DIAGNOSIS — F329 Major depressive disorder, single episode, unspecified: Secondary | ICD-10-CM | POA: Diagnosis not present

## 2018-09-12 DIAGNOSIS — G4700 Insomnia, unspecified: Secondary | ICD-10-CM | POA: Diagnosis not present

## 2018-09-12 DIAGNOSIS — Z4789 Encounter for other orthopedic aftercare: Secondary | ICD-10-CM | POA: Diagnosis not present

## 2018-09-12 DIAGNOSIS — G935 Compression of brain: Secondary | ICD-10-CM | POA: Diagnosis not present

## 2018-09-12 DIAGNOSIS — M2550 Pain in unspecified joint: Secondary | ICD-10-CM | POA: Diagnosis not present

## 2018-09-12 DIAGNOSIS — M899 Disorder of bone, unspecified: Secondary | ICD-10-CM | POA: Diagnosis not present

## 2018-09-12 DIAGNOSIS — R5381 Other malaise: Secondary | ICD-10-CM | POA: Diagnosis not present

## 2018-09-12 DIAGNOSIS — M4326 Fusion of spine, lumbar region: Secondary | ICD-10-CM | POA: Diagnosis not present

## 2018-09-12 DIAGNOSIS — R262 Difficulty in walking, not elsewhere classified: Secondary | ICD-10-CM | POA: Diagnosis not present

## 2018-09-12 DIAGNOSIS — F0390 Unspecified dementia without behavioral disturbance: Secondary | ICD-10-CM | POA: Diagnosis not present

## 2018-09-12 DIAGNOSIS — K219 Gastro-esophageal reflux disease without esophagitis: Secondary | ICD-10-CM | POA: Diagnosis not present

## 2018-09-12 DIAGNOSIS — M4316 Spondylolisthesis, lumbar region: Secondary | ICD-10-CM | POA: Diagnosis not present

## 2018-09-12 DIAGNOSIS — M1990 Unspecified osteoarthritis, unspecified site: Secondary | ICD-10-CM | POA: Diagnosis not present

## 2018-09-12 DIAGNOSIS — R010 Benign and innocent cardiac murmurs: Secondary | ICD-10-CM | POA: Diagnosis not present

## 2018-09-12 DIAGNOSIS — Z7401 Bed confinement status: Secondary | ICD-10-CM | POA: Diagnosis not present

## 2018-09-12 DIAGNOSIS — D649 Anemia, unspecified: Secondary | ICD-10-CM | POA: Diagnosis not present

## 2018-09-12 DIAGNOSIS — M48062 Spinal stenosis, lumbar region with neurogenic claudication: Secondary | ICD-10-CM | POA: Diagnosis not present

## 2018-09-12 DIAGNOSIS — G8918 Other acute postprocedural pain: Secondary | ICD-10-CM | POA: Diagnosis not present

## 2018-09-12 DIAGNOSIS — M949 Disorder of cartilage, unspecified: Secondary | ICD-10-CM | POA: Diagnosis not present

## 2018-09-12 DIAGNOSIS — R55 Syncope and collapse: Secondary | ICD-10-CM | POA: Diagnosis not present

## 2018-09-12 DIAGNOSIS — M5416 Radiculopathy, lumbar region: Secondary | ICD-10-CM | POA: Diagnosis not present

## 2018-09-12 LAB — VITAMIN D 25 HYDROXY (VIT D DEFICIENCY, FRACTURES): Vit D, 25-Hydroxy: 41.3 ng/mL (ref 30.0–100.0)

## 2018-09-12 MED ORDER — METHOCARBAMOL 500 MG PO TABS: 500.0000 mg | ORAL_TABLET | Freq: Three times a day (TID) | ORAL | 0 refills | Status: DC | PRN

## 2018-09-12 MED ORDER — ONDANSETRON HCL 4 MG PO TABS: 4.0000 mg | ORAL_TABLET | Freq: Four times a day (QID) | ORAL | 0 refills | Status: DC | PRN

## 2018-09-12 MED ORDER — DOCUSATE SODIUM 100 MG PO CAPS: 100.0000 mg | ORAL_CAPSULE | Freq: Two times a day (BID) | ORAL | 0 refills | Status: DC

## 2018-09-12 MED ORDER — HYDROCODONE-ACETAMINOPHEN 7.5-325 MG PO TABS: 1.0000 | ORAL_TABLET | ORAL | 0 refills | Status: DC | PRN

## 2018-09-12 MED ORDER — CELECOXIB 200 MG PO CAPS: 200.0000 mg | ORAL_CAPSULE | Freq: Every day | ORAL | 3 refills | Status: DC

## 2018-09-12 MED ORDER — VITAMIN D 25 MCG (1000 UNIT) PO TABS: 1000.0000 [IU] | ORAL_TABLET | Freq: Every day | ORAL | 6 refills | Status: DC

## 2018-09-12 NOTE — Progress Notes (Signed)
     Subjective: 3 Days Post-Op Procedure(s) (LRB): LEFT TRANSFORAMINAL LUMBAR INTERBODY FUSION L3-4 WITH DEPUY MPACT PEDICLE SCREWS AND RODS AND CAGE L3-4, DECOMPRESSION/CENTRAL LAMINECTOMY L4-5 (N/A)Awake, Alert and oriented x 3. No nu56mness no weakness.   Patient reports pain as moderate.    Objective:   VITALS:  Temp:  [97.6 F (36.4 C)-98.7 F (37.1 C)] 97.6 F (36.4 C) (02/21 0442) Pulse Rate:  [83-90] 83 (02/21 0442) Resp:  [17-20] 20 (02/21 0442) BP: (111-122)/(48-55) 111/55 (02/21 0442) SpO2:  [96 %-98 %] 96 % (02/21 0442)  Neurologically intact ABD soft Neurovascular intact Sensation intact distally Intact pulses distally Dorsiflexion/Plantar flexion intact Incision: dressing C/D/I and no drainage No cellulitis present   LABS Recent Labs    09/10/18 0316 09/11/18 0504  HGB 9.3* 9.4*  WBC 13.6* 11.6*  PLT 190 173   Recent Labs    09/10/18 0316 09/11/18 0504  NA 141 138  K 4.2 3.9  CL 110 108  CO2 27 24  BUN 10 12  CREATININE 0.84 0.82  GLUCOSE 97 93   No results for input(s): LABPT, INR in the last 72 hours.   Assessment/Plan: 3 Days Post-Op Procedure(s) (LRB): LEFT TRANSFORAMINAL LUMBAR INTERBODY FUSION L3-4 WITH DEPUY MPACT PEDICLE SCREWS AND RODS AND CAGE L3-4, DECOMPRESSION/CENTRAL LAMINECTOMY L4-5 (N/A)  Advance diet Up with therapy Discharge to SNF  Carly Larson 09/12/2018, 9:25 AMPatient ID: Carly Larson, female   DOB: 12-26-1950, 68 y.o.   MRN: [id number]

## 2018-09-12 NOTE — Progress Notes (Signed)
PASRR number received:  [id number] [employer], Reader

## 2018-09-12 NOTE — Progress Notes (Signed)
Patient will DC to: [facility]  Anticipated DC date: 09/12/2018 Family notified: [name] Transport by: Corey Harold  Per MD patient ready for DC to [facility]. RN, patient, patient's family, and facility notified of DC. Discharge Summary sent to facility. RN given number for report [phone] Ext 229 Room 713. DC packet on chart. Ambulance transport requested for patient.  CSW signing off.   [place], [place]

## 2018-09-12 NOTE — Progress Notes (Signed)
Discharge instructions completed with pt and her husband.  They verbalized understanding of the information.  Pt denies chest pain, shortness of breath, dizziness, lightheadedness, and n/v.  Pt's IV discontinued. Pt discharged to rehab facility.

## 2018-09-12 NOTE — Clinical Social Work Placement (Signed)
   CLINICAL SOCIAL WORK PLACEMENT  NOTE  Date:  09/12/2018  Patient Details  Name: Carly Larson MRN: [id number] Date of Birth: 12/13/1950  Clinical Social Work is seeking post-discharge placement for this patient at the [place] level of care (*CSW will initial, date and re-position this form in  chart as items are completed):      Patient/family provided with [facility] Work Department's list of facilities offering this level of care within the geographic area requested by the patient (or if unable, by the patient's family).  Yes   Patient/family informed of their freedom to choose among providers that offer the needed level of care, that participate in Medicare, Medicaid or managed care program needed by the patient, have an available bed and are willing to accept the patient.      Patient/family informed of [facility]'s ownership interest in [facility] and [facility], as well as of the fact that they are under no obligation to receive care at these facilities.  PASRR submitted to EDS on       PASRR number received on 09/12/18     Existing PASRR number confirmed on       FL2 transmitted to all facilities in geographic area requested by pt/family on 09/11/18     FL2 transmitted to all facilities within larger geographic area on       Patient informed that his/her managed care company has contracts with or will negotiate with certain facilities, including the following:        Yes   Patient/family informed of bed offers received.  Patient chooses bed at [facility]     Physician recommends and patient chooses bed at      Patient to be transferred to [facility] on 09/12/18.  Patient to be transferred to facility by [facility]     Patient family notified on 09/12/18 of transfer.  Name of family member notified:  [name] (daughter)     PHYSICIAN       Additional Comment:     _______________________________________________ Alberteen Sam, LCSW 09/12/2018, 10:58 AM

## 2018-09-12 NOTE — Plan of Care (Signed)
  Problem: Pain Managment: Goal: General experience of comfort will improve Outcome: Progressing   

## 2018-09-16 DIAGNOSIS — M4316 Spondylolisthesis, lumbar region: Secondary | ICD-10-CM | POA: Diagnosis not present

## 2018-09-16 DIAGNOSIS — G8918 Other acute postprocedural pain: Secondary | ICD-10-CM | POA: Diagnosis not present

## 2018-09-16 DIAGNOSIS — D649 Anemia, unspecified: Secondary | ICD-10-CM | POA: Diagnosis not present

## 2018-09-16 DIAGNOSIS — R262 Difficulty in walking, not elsewhere classified: Secondary | ICD-10-CM | POA: Diagnosis not present

## 2018-09-24 ENCOUNTER — Encounter (INDEPENDENT_AMBULATORY_CARE_PROVIDER_SITE_OTHER): Payer: Self-pay | Admitting: Surgery

## 2018-09-24 ENCOUNTER — Ambulatory Visit (INDEPENDENT_AMBULATORY_CARE_PROVIDER_SITE_OTHER): Payer: Self-pay

## 2018-09-24 ENCOUNTER — Ambulatory Visit (INDEPENDENT_AMBULATORY_CARE_PROVIDER_SITE_OTHER): Payer: Medicare Other | Admitting: Surgery

## 2018-09-24 VITALS — BP 128/68 | HR 103 | Ht 60.0 in | Wt 130.0 lb

## 2018-09-24 DIAGNOSIS — Z981 Arthrodesis status: Secondary | ICD-10-CM

## 2018-09-24 MED ORDER — CEPHALEXIN 500 MG PO CAPS: 500.0000 mg | ORAL_CAPSULE | Freq: Four times a day (QID) | ORAL | 0 refills | Status: DC

## 2018-09-24 NOTE — Progress Notes (Signed)
Post-Op Visit Note   Patient: Carly Larson           Date of Birth: [birth year]           MRN: [id number] Visit Date: 09/24/2018 PCP: Cyndi Bender, PA-C   Assessment & Plan:  Chief Complaint:  Chief Complaint  Patient presents with  . Lower Back - Routine Post Op  Patient doing well.  States that preop leg pain is gone.  She is soon to be discharged from skilled facility.  Pleased with her progress up to this point.    Visit Diagnoses:  1. S/P lumbar spinal fusion   Stitch abscess  Plan: Dr. Louanne Skye did evaluate patient's surgical incision with me today.  She does have a stitch abscess.  Some slight redness along the incision.  Subcuticular closure was performed.  No drainage.  Will start patient on Keflex 500 mg p.o. every 6 hours prophylactically.  Facility will apply Betadine to the incision area daily.  Follow-up in 1 week with Dr. Louanne Skye for recheck of her wound.  Follow-Up Instructions: Return in about 4 weeks (around 10/22/2018) for with Dr Louanne Skye for wound check. .   Orders:  Orders Placed This Encounter  Procedures  . XR Lumbar Spine 2-3 Views   Meds ordered this encounter  Medications  . cephALEXin (KEFLEX) 500 MG capsule    Sig: Take 1 capsule (500 mg total) by mouth 4 (four) times daily.    Dispense:  30 capsule    Refill:  0    Imaging: No results found.  PMFS History: Patient Active Problem List   Diagnosis Date Noted  . Spondylolisthesis, lumbar region 09/10/2018    Class: Chronic  . Spinal stenosis of lumbar region with neurogenic claudication 09/10/2018    Class: Chronic  . Anemia due to acute blood loss 09/10/2018  . Fusion of spine of lumbar region 09/09/2018  . Dementia ([place]) 12/23/2017  . Syncope 12/23/2017  . Heart murmur 12/23/2017  . Unilateral primary osteoarthritis, right knee 10/23/2017  . Unilateral primary osteoarthritis, left knee 09/23/2017  . Chronic pain of left knee 08/08/2017  . Chronic pain of right knee 08/08/2017  .  Sciatica, left side 08/08/2017  . Memory loss 12/03/2013   Past Medical History:  Diagnosis Date  . Anxiety state, unspecified   . Arthritis   . Carpal tunnel syndrome    left  . Chiari malformation type I ([place])   . Depression   . Disorder of bone and cartilage, unspecified   . Encounter for long-term (current) use of other medications   . Esophageal reflux   . Headache   . High risk medication use   . Insomnia   . Memory loss    MMSE 24/30 on 12/05/11, 30/30 on 10/25/13  . Memory loss   . Menopausal symptoms   . Osteopenia    Hx of vitamin D deficiency  . Osteopenia   . Symptomatic menopausal or female climacteric states   . White matter abnormality on MRI of brain     Family History  Problem Relation Age of Onset  . Diabetes Mother   . Osteoarthritis Mother   . Hypertension Mother   . Dementia Mother   . Stroke Father   . Heart attack Brother   . CAD Brother   . Breast cancer Maternal Aunt     Past Surgical History:  Procedure Laterality Date  . APPENDECTOMY    . CARPAL TUNNEL RELEASE Left   . CATARACT EXTRACTION,  BILATERAL    . COLONOSCOPY    . LUMBAR FUSION  09/09/2018   Social History   Occupational History  . 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   Tobacco Use  . Smoking status: Never Smoker  . Smokeless tobacco: Never Used  Substance and Sexual Activity  . Alcohol use: No    Alcohol/week: 0.0 standard drinks  . Drug use: No  . Sexual activity: Not on file

## 2018-09-25 ENCOUNTER — Telehealth (INDEPENDENT_AMBULATORY_CARE_PROVIDER_SITE_OTHER): Payer: Self-pay | Admitting: Specialist

## 2018-09-25 DIAGNOSIS — Z981 Arthrodesis status: Secondary | ICD-10-CM | POA: Diagnosis not present

## 2018-09-25 DIAGNOSIS — Z4789 Encounter for other orthopedic aftercare: Secondary | ICD-10-CM | POA: Diagnosis not present

## 2018-09-25 DIAGNOSIS — K219 Gastro-esophageal reflux disease without esophagitis: Secondary | ICD-10-CM | POA: Diagnosis not present

## 2018-09-25 DIAGNOSIS — G629 Polyneuropathy, unspecified: Secondary | ICD-10-CM | POA: Diagnosis not present

## 2018-09-25 DIAGNOSIS — M810 Age-related osteoporosis without current pathological fracture: Secondary | ICD-10-CM | POA: Diagnosis not present

## 2018-09-25 DIAGNOSIS — F329 Major depressive disorder, single episode, unspecified: Secondary | ICD-10-CM | POA: Diagnosis not present

## 2018-09-25 DIAGNOSIS — F419 Anxiety disorder, unspecified: Secondary | ICD-10-CM | POA: Diagnosis not present

## 2018-09-25 DIAGNOSIS — M1991 Primary osteoarthritis, unspecified site: Secondary | ICD-10-CM | POA: Diagnosis not present

## 2018-09-25 DIAGNOSIS — D649 Anemia, unspecified: Secondary | ICD-10-CM | POA: Diagnosis not present

## 2018-09-25 DIAGNOSIS — Z9181 History of falling: Secondary | ICD-10-CM | POA: Diagnosis not present

## 2018-09-25 DIAGNOSIS — F0390 Unspecified dementia without behavioral disturbance: Secondary | ICD-10-CM | POA: Diagnosis not present

## 2018-09-25 DIAGNOSIS — Z79891 Long term (current) use of opiate analgesic: Secondary | ICD-10-CM | POA: Diagnosis not present

## 2018-09-25 NOTE — Telephone Encounter (Signed)
Ok per Dr. Nitka. 

## 2018-09-25 NOTE — Telephone Encounter (Signed)
Sharyn Lull with [facility] called in said if Dr.Nitka would be okay signing an order for this pt for physical therapy for 1 week for 2 weeks.  [phone]

## 2018-09-26 NOTE — Telephone Encounter (Signed)
I called and gave Sharyn Lull verbal ok for orders.

## 2018-09-27 DIAGNOSIS — F419 Anxiety disorder, unspecified: Secondary | ICD-10-CM | POA: Diagnosis not present

## 2018-09-27 DIAGNOSIS — M810 Age-related osteoporosis without current pathological fracture: Secondary | ICD-10-CM | POA: Diagnosis not present

## 2018-09-27 DIAGNOSIS — Z4789 Encounter for other orthopedic aftercare: Secondary | ICD-10-CM | POA: Diagnosis not present

## 2018-09-27 DIAGNOSIS — G629 Polyneuropathy, unspecified: Secondary | ICD-10-CM | POA: Diagnosis not present

## 2018-09-27 DIAGNOSIS — F0390 Unspecified dementia without behavioral disturbance: Secondary | ICD-10-CM | POA: Diagnosis not present

## 2018-09-27 DIAGNOSIS — F329 Major depressive disorder, single episode, unspecified: Secondary | ICD-10-CM | POA: Diagnosis not present

## 2018-09-29 ENCOUNTER — Telehealth (INDEPENDENT_AMBULATORY_CARE_PROVIDER_SITE_OTHER): Payer: Self-pay | Admitting: Specialist

## 2018-09-29 NOTE — Telephone Encounter (Signed)
Received voicemail message from Gretchen-(PT) from [facility] needing verbal orders for HHPT 2 Wk 3 and 1 Wk 4    The number to contact Elzie Rings is [phone]

## 2018-09-29 NOTE — Telephone Encounter (Signed)
lmom for Elzie Rings given verbal ok for visits

## 2018-10-01 DIAGNOSIS — F419 Anxiety disorder, unspecified: Secondary | ICD-10-CM | POA: Diagnosis not present

## 2018-10-01 DIAGNOSIS — G629 Polyneuropathy, unspecified: Secondary | ICD-10-CM | POA: Diagnosis not present

## 2018-10-01 DIAGNOSIS — F329 Major depressive disorder, single episode, unspecified: Secondary | ICD-10-CM | POA: Diagnosis not present

## 2018-10-01 DIAGNOSIS — Z4789 Encounter for other orthopedic aftercare: Secondary | ICD-10-CM | POA: Diagnosis not present

## 2018-10-01 DIAGNOSIS — F0390 Unspecified dementia without behavioral disturbance: Secondary | ICD-10-CM | POA: Diagnosis not present

## 2018-10-01 DIAGNOSIS — M810 Age-related osteoporosis without current pathological fracture: Secondary | ICD-10-CM | POA: Diagnosis not present

## 2018-10-03 DIAGNOSIS — M810 Age-related osteoporosis without current pathological fracture: Secondary | ICD-10-CM | POA: Diagnosis not present

## 2018-10-03 DIAGNOSIS — Z4789 Encounter for other orthopedic aftercare: Secondary | ICD-10-CM | POA: Diagnosis not present

## 2018-10-03 DIAGNOSIS — G629 Polyneuropathy, unspecified: Secondary | ICD-10-CM | POA: Diagnosis not present

## 2018-10-03 DIAGNOSIS — F419 Anxiety disorder, unspecified: Secondary | ICD-10-CM | POA: Diagnosis not present

## 2018-10-03 DIAGNOSIS — F0390 Unspecified dementia without behavioral disturbance: Secondary | ICD-10-CM | POA: Diagnosis not present

## 2018-10-03 DIAGNOSIS — F329 Major depressive disorder, single episode, unspecified: Secondary | ICD-10-CM | POA: Diagnosis not present

## 2018-10-06 DIAGNOSIS — F419 Anxiety disorder, unspecified: Secondary | ICD-10-CM | POA: Diagnosis not present

## 2018-10-06 DIAGNOSIS — F0390 Unspecified dementia without behavioral disturbance: Secondary | ICD-10-CM | POA: Diagnosis not present

## 2018-10-06 DIAGNOSIS — Z4789 Encounter for other orthopedic aftercare: Secondary | ICD-10-CM | POA: Diagnosis not present

## 2018-10-06 DIAGNOSIS — F329 Major depressive disorder, single episode, unspecified: Secondary | ICD-10-CM | POA: Diagnosis not present

## 2018-10-06 DIAGNOSIS — M810 Age-related osteoporosis without current pathological fracture: Secondary | ICD-10-CM | POA: Diagnosis not present

## 2018-10-06 DIAGNOSIS — G629 Polyneuropathy, unspecified: Secondary | ICD-10-CM | POA: Diagnosis not present

## 2018-10-08 DIAGNOSIS — G629 Polyneuropathy, unspecified: Secondary | ICD-10-CM | POA: Diagnosis not present

## 2018-10-08 DIAGNOSIS — F0390 Unspecified dementia without behavioral disturbance: Secondary | ICD-10-CM | POA: Diagnosis not present

## 2018-10-08 DIAGNOSIS — Z4789 Encounter for other orthopedic aftercare: Secondary | ICD-10-CM | POA: Diagnosis not present

## 2018-10-08 DIAGNOSIS — F419 Anxiety disorder, unspecified: Secondary | ICD-10-CM | POA: Diagnosis not present

## 2018-10-08 DIAGNOSIS — F329 Major depressive disorder, single episode, unspecified: Secondary | ICD-10-CM | POA: Diagnosis not present

## 2018-10-08 DIAGNOSIS — M810 Age-related osteoporosis without current pathological fracture: Secondary | ICD-10-CM | POA: Diagnosis not present

## 2018-10-10 DIAGNOSIS — F419 Anxiety disorder, unspecified: Secondary | ICD-10-CM | POA: Diagnosis not present

## 2018-10-10 DIAGNOSIS — F0390 Unspecified dementia without behavioral disturbance: Secondary | ICD-10-CM | POA: Diagnosis not present

## 2018-10-10 DIAGNOSIS — G629 Polyneuropathy, unspecified: Secondary | ICD-10-CM | POA: Diagnosis not present

## 2018-10-10 DIAGNOSIS — Z4789 Encounter for other orthopedic aftercare: Secondary | ICD-10-CM | POA: Diagnosis not present

## 2018-10-10 DIAGNOSIS — M810 Age-related osteoporosis without current pathological fracture: Secondary | ICD-10-CM | POA: Diagnosis not present

## 2018-10-10 DIAGNOSIS — F329 Major depressive disorder, single episode, unspecified: Secondary | ICD-10-CM | POA: Diagnosis not present

## 2018-10-11 DIAGNOSIS — M48061 Spinal stenosis, lumbar region without neurogenic claudication: Secondary | ICD-10-CM | POA: Diagnosis not present

## 2018-10-14 DIAGNOSIS — Z4789 Encounter for other orthopedic aftercare: Secondary | ICD-10-CM | POA: Diagnosis not present

## 2018-10-14 DIAGNOSIS — G629 Polyneuropathy, unspecified: Secondary | ICD-10-CM | POA: Diagnosis not present

## 2018-10-14 DIAGNOSIS — F0390 Unspecified dementia without behavioral disturbance: Secondary | ICD-10-CM | POA: Diagnosis not present

## 2018-10-14 DIAGNOSIS — M810 Age-related osteoporosis without current pathological fracture: Secondary | ICD-10-CM | POA: Diagnosis not present

## 2018-10-14 DIAGNOSIS — F329 Major depressive disorder, single episode, unspecified: Secondary | ICD-10-CM | POA: Diagnosis not present

## 2018-10-14 DIAGNOSIS — F419 Anxiety disorder, unspecified: Secondary | ICD-10-CM | POA: Diagnosis not present

## 2018-10-16 DIAGNOSIS — G629 Polyneuropathy, unspecified: Secondary | ICD-10-CM | POA: Diagnosis not present

## 2018-10-16 DIAGNOSIS — Z4789 Encounter for other orthopedic aftercare: Secondary | ICD-10-CM | POA: Diagnosis not present

## 2018-10-16 DIAGNOSIS — F419 Anxiety disorder, unspecified: Secondary | ICD-10-CM | POA: Diagnosis not present

## 2018-10-16 DIAGNOSIS — F329 Major depressive disorder, single episode, unspecified: Secondary | ICD-10-CM | POA: Diagnosis not present

## 2018-10-16 DIAGNOSIS — M810 Age-related osteoporosis without current pathological fracture: Secondary | ICD-10-CM | POA: Diagnosis not present

## 2018-10-16 DIAGNOSIS — F0390 Unspecified dementia without behavioral disturbance: Secondary | ICD-10-CM | POA: Diagnosis not present

## 2018-10-20 DIAGNOSIS — F419 Anxiety disorder, unspecified: Secondary | ICD-10-CM | POA: Diagnosis not present

## 2018-10-20 DIAGNOSIS — F329 Major depressive disorder, single episode, unspecified: Secondary | ICD-10-CM | POA: Diagnosis not present

## 2018-10-20 DIAGNOSIS — F0390 Unspecified dementia without behavioral disturbance: Secondary | ICD-10-CM | POA: Diagnosis not present

## 2018-10-20 DIAGNOSIS — G629 Polyneuropathy, unspecified: Secondary | ICD-10-CM | POA: Diagnosis not present

## 2018-10-20 DIAGNOSIS — M810 Age-related osteoporosis without current pathological fracture: Secondary | ICD-10-CM | POA: Diagnosis not present

## 2018-10-20 DIAGNOSIS — Z4789 Encounter for other orthopedic aftercare: Secondary | ICD-10-CM | POA: Diagnosis not present

## 2018-10-22 DIAGNOSIS — G629 Polyneuropathy, unspecified: Secondary | ICD-10-CM | POA: Diagnosis not present

## 2018-10-22 DIAGNOSIS — Z4789 Encounter for other orthopedic aftercare: Secondary | ICD-10-CM | POA: Diagnosis not present

## 2018-10-22 DIAGNOSIS — F0390 Unspecified dementia without behavioral disturbance: Secondary | ICD-10-CM | POA: Diagnosis not present

## 2018-10-22 DIAGNOSIS — F419 Anxiety disorder, unspecified: Secondary | ICD-10-CM | POA: Diagnosis not present

## 2018-10-22 DIAGNOSIS — F329 Major depressive disorder, single episode, unspecified: Secondary | ICD-10-CM | POA: Diagnosis not present

## 2018-10-22 DIAGNOSIS — M810 Age-related osteoporosis without current pathological fracture: Secondary | ICD-10-CM | POA: Diagnosis not present

## 2018-10-23 ENCOUNTER — Telehealth (INDEPENDENT_AMBULATORY_CARE_PROVIDER_SITE_OTHER): Payer: Self-pay | Admitting: Radiology

## 2018-10-23 NOTE — Telephone Encounter (Signed)
Called and spoke with patient, patient answered NO to all pre screening questions.  

## 2018-10-24 ENCOUNTER — Encounter (INDEPENDENT_AMBULATORY_CARE_PROVIDER_SITE_OTHER): Payer: Self-pay | Admitting: Specialist

## 2018-10-24 ENCOUNTER — Ambulatory Visit (INDEPENDENT_AMBULATORY_CARE_PROVIDER_SITE_OTHER): Payer: Self-pay

## 2018-10-24 ENCOUNTER — Ambulatory Visit (INDEPENDENT_AMBULATORY_CARE_PROVIDER_SITE_OTHER): Payer: Medicare Other | Admitting: Specialist

## 2018-10-24 ENCOUNTER — Other Ambulatory Visit: Payer: Self-pay

## 2018-10-24 VITALS — BP 136/66 | HR 91 | Ht 60.0 in | Wt 130.0 lb

## 2018-10-24 DIAGNOSIS — Z981 Arthrodesis status: Secondary | ICD-10-CM

## 2018-10-24 NOTE — Patient Instructions (Signed)
Plan: Avoid frequent bending and stooping  No lifting greater than 10 lbs. May use ice or moist heat for pain. Weight loss is of benefit. Take tylenol or tramadol for pain if necessary. Exercise is important to improve your indurance and does allow people to function better inspite of back pain. Take vitamin D at least 2,000 mg per day. Calcium 1500 mg per day. Celebrex 200mg  per day may stop now.

## 2018-10-24 NOTE — Progress Notes (Addendum)
Post-Op Visit Note   Patient: Carly Larson           Date of Birth: 1951-05-20           MRN: [id number] Visit Date: 10/24/2018 PCP: Cyndi Bender, PA-C   Assessment & Plan: 4 weeks post L4-5 fusion  Chief Complaint:  Chief Complaint  Patient presents with  . Lower Back - Routine Post Op  Incision is healing well, no drainage. Legs with normal motor and sensation.   Visit Diagnoses:  1. S/P lumbar spinal fusion     Plan: Avoid frequent bending and stooping  No lifting greater than 10 lbs. May use ice or moist heat for pain. Weight loss is of benefit. Take tylenol or tramadol for pain if necessary. Exercise is important to improve your indurance and does allow people to function better inspite of back pain. Take vitamin D at least 2,000 mg per day. Calcium 1500 mg per day. Celebrex 200mg  per day may stop now.    Follow-Up Instructions: Return in about 7 weeks (around 12/12/2018).   Orders:  Orders Placed This Encounter  Procedures  . XR Lumbar Spine 2-3 Views   No orders of the defined types were placed in this encounter.   Imaging: No results found.  PMFS History: Patient Active Problem List   Diagnosis Date Noted  . Spondylolisthesis, lumbar region 09/10/2018    Priority: High    Class: Chronic  . Spinal stenosis of lumbar region with neurogenic claudication 09/10/2018    Priority: High    Class: Chronic  . Anemia due to acute blood loss 09/10/2018    Priority: High  . Fusion of spine of lumbar region 09/09/2018  . Dementia ([place]) 12/23/2017  . Syncope 12/23/2017  . Heart murmur 12/23/2017  . Unilateral primary osteoarthritis, right knee 10/23/2017  . Unilateral primary osteoarthritis, left knee 09/23/2017  . Chronic pain of left knee 08/08/2017  . Chronic pain of right knee 08/08/2017  . Sciatica, left side 08/08/2017  . Memory loss 12/03/2013   Past Medical History:  Diagnosis Date  . Anxiety state, unspecified   . Arthritis   . Carpal  tunnel syndrome    left  . Chiari malformation type I ([place])   . Depression   . Disorder of bone and cartilage, unspecified   . Encounter for long-term (current) use of other medications   . Esophageal reflux   . Headache   . High risk medication use   . Insomnia   . Memory loss    MMSE 24/30 on 12/05/11, 30/30 on 10/25/13  . Memory loss   . Menopausal symptoms   . Osteopenia    Hx of vitamin D deficiency  . Osteopenia   . Symptomatic menopausal or female climacteric states   . White matter abnormality on MRI of brain     Family History  Problem Relation Age of Onset  . Diabetes Mother   . Osteoarthritis Mother   . Hypertension Mother   . Dementia Mother   . Stroke Father   . Heart attack Brother   . CAD Brother   . Breast cancer Maternal Aunt     Past Surgical History:  Procedure Laterality Date  . APPENDECTOMY    . CARPAL TUNNEL RELEASE Left   . CATARACT EXTRACTION, BILATERAL    . COLONOSCOPY    . LUMBAR FUSION  09/09/2018   Social History   Occupational History  . 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 Smoking status: Never Smoker  . Smokeless tobacco: Never Used  Substance and Sexual Activity  . Alcohol use: No    Alcohol/week: 0.0 standard drinks  . Drug use: No  . Sexual activity: Not on file

## 2018-10-25 DIAGNOSIS — M1991 Primary osteoarthritis, unspecified site: Secondary | ICD-10-CM | POA: Diagnosis not present

## 2018-10-25 DIAGNOSIS — F0390 Unspecified dementia without behavioral disturbance: Secondary | ICD-10-CM | POA: Diagnosis not present

## 2018-10-25 DIAGNOSIS — F419 Anxiety disorder, unspecified: Secondary | ICD-10-CM | POA: Diagnosis not present

## 2018-10-25 DIAGNOSIS — Z9181 History of falling: Secondary | ICD-10-CM | POA: Diagnosis not present

## 2018-10-25 DIAGNOSIS — Z4789 Encounter for other orthopedic aftercare: Secondary | ICD-10-CM | POA: Diagnosis not present

## 2018-10-25 DIAGNOSIS — F329 Major depressive disorder, single episode, unspecified: Secondary | ICD-10-CM | POA: Diagnosis not present

## 2018-10-25 DIAGNOSIS — M810 Age-related osteoporosis without current pathological fracture: Secondary | ICD-10-CM | POA: Diagnosis not present

## 2018-10-25 DIAGNOSIS — G629 Polyneuropathy, unspecified: Secondary | ICD-10-CM | POA: Diagnosis not present

## 2018-10-25 DIAGNOSIS — D649 Anemia, unspecified: Secondary | ICD-10-CM | POA: Diagnosis not present

## 2018-10-25 DIAGNOSIS — K219 Gastro-esophageal reflux disease without esophagitis: Secondary | ICD-10-CM | POA: Diagnosis not present

## 2018-10-25 DIAGNOSIS — Z79891 Long term (current) use of opiate analgesic: Secondary | ICD-10-CM | POA: Diagnosis not present

## 2018-10-25 DIAGNOSIS — Z981 Arthrodesis status: Secondary | ICD-10-CM | POA: Diagnosis not present

## 2018-10-27 DIAGNOSIS — F329 Major depressive disorder, single episode, unspecified: Secondary | ICD-10-CM | POA: Diagnosis not present

## 2018-10-27 DIAGNOSIS — M810 Age-related osteoporosis without current pathological fracture: Secondary | ICD-10-CM | POA: Diagnosis not present

## 2018-10-27 DIAGNOSIS — Z4789 Encounter for other orthopedic aftercare: Secondary | ICD-10-CM | POA: Diagnosis not present

## 2018-10-27 DIAGNOSIS — G629 Polyneuropathy, unspecified: Secondary | ICD-10-CM | POA: Diagnosis not present

## 2018-10-27 DIAGNOSIS — F0390 Unspecified dementia without behavioral disturbance: Secondary | ICD-10-CM | POA: Diagnosis not present

## 2018-10-27 DIAGNOSIS — F419 Anxiety disorder, unspecified: Secondary | ICD-10-CM | POA: Diagnosis not present

## 2018-11-03 DIAGNOSIS — F419 Anxiety disorder, unspecified: Secondary | ICD-10-CM | POA: Diagnosis not present

## 2018-11-03 DIAGNOSIS — M810 Age-related osteoporosis without current pathological fracture: Secondary | ICD-10-CM | POA: Diagnosis not present

## 2018-11-03 DIAGNOSIS — F329 Major depressive disorder, single episode, unspecified: Secondary | ICD-10-CM | POA: Diagnosis not present

## 2018-11-03 DIAGNOSIS — Z4789 Encounter for other orthopedic aftercare: Secondary | ICD-10-CM | POA: Diagnosis not present

## 2018-11-03 DIAGNOSIS — G629 Polyneuropathy, unspecified: Secondary | ICD-10-CM | POA: Diagnosis not present

## 2018-11-03 DIAGNOSIS — F0390 Unspecified dementia without behavioral disturbance: Secondary | ICD-10-CM | POA: Diagnosis not present

## 2018-11-06 DIAGNOSIS — F329 Major depressive disorder, single episode, unspecified: Secondary | ICD-10-CM | POA: Diagnosis not present

## 2018-11-06 DIAGNOSIS — F0390 Unspecified dementia without behavioral disturbance: Secondary | ICD-10-CM | POA: Diagnosis not present

## 2018-11-06 DIAGNOSIS — M810 Age-related osteoporosis without current pathological fracture: Secondary | ICD-10-CM | POA: Diagnosis not present

## 2018-11-06 DIAGNOSIS — G629 Polyneuropathy, unspecified: Secondary | ICD-10-CM | POA: Diagnosis not present

## 2018-11-06 DIAGNOSIS — Z4789 Encounter for other orthopedic aftercare: Secondary | ICD-10-CM | POA: Diagnosis not present

## 2018-11-06 DIAGNOSIS — F419 Anxiety disorder, unspecified: Secondary | ICD-10-CM | POA: Diagnosis not present

## 2018-12-22 DIAGNOSIS — M25562 Pain in left knee: Secondary | ICD-10-CM | POA: Diagnosis not present

## 2018-12-22 DIAGNOSIS — F0390 Unspecified dementia without behavioral disturbance: Secondary | ICD-10-CM | POA: Diagnosis not present

## 2018-12-22 DIAGNOSIS — M8580 Other specified disorders of bone density and structure, unspecified site: Secondary | ICD-10-CM | POA: Diagnosis not present

## 2018-12-22 DIAGNOSIS — M48061 Spinal stenosis, lumbar region without neurogenic claudication: Secondary | ICD-10-CM | POA: Diagnosis not present

## 2019-01-01 ENCOUNTER — Encounter: Payer: Self-pay | Admitting: Specialist

## 2019-01-01 ENCOUNTER — Ambulatory Visit: Payer: Self-pay

## 2019-01-01 ENCOUNTER — Other Ambulatory Visit: Payer: Self-pay

## 2019-01-01 ENCOUNTER — Ambulatory Visit (INDEPENDENT_AMBULATORY_CARE_PROVIDER_SITE_OTHER): Payer: Medicare Other | Admitting: Specialist

## 2019-01-01 VITALS — BP 167/69 | HR 83 | Ht 60.0 in | Wt 130.0 lb

## 2019-01-01 DIAGNOSIS — Z981 Arthrodesis status: Secondary | ICD-10-CM

## 2019-01-01 DIAGNOSIS — M25561 Pain in right knee: Secondary | ICD-10-CM

## 2019-01-01 NOTE — Progress Notes (Signed)
Office Visit Note   Patient: Carly Larson           Date of Birth: 07-25-1950           MRN: [id number] Visit Date: 01/01/2019              Requested by: Cyndi Bender, PA-C [address] [place],  [place] [address] PCP: Cyndi Bender, PA-C   Assessment & Plan: Visit Diagnoses:  1. S/P lumbar spinal fusion   2. Right knee pain, unspecified chronicity     Plan:   Follow-Up Instructions: Return in about 2 months (around 03/03/2019) for with Dr Louanne Skye recheck lumbar fusion.   Orders:  Orders Placed This Encounter  Procedures  . XR Lumbar Spine 2-3 Views  . XR Knee 1-2 Views Right   No orders of the defined types were placed in this encounter.     Procedures: Large Joint Inj: R knee on 01/01/2019 4:19 PM Details: 25 G 1.5 in needle, anteromedial approach Medications: 3 mL lidocaine 1 %; 6 mL bupivacaine 0.25 %; 80 mg methylPREDNISolone acetate 40 MG/ML Procedure, treatment alternatives, risks and benefits explained, specific risks discussed. Consent was given by the patient. Patient was prepped and draped in the usual sterile fashion.       Clinical Data: No additional findings.   Subjective: Chief Complaint  Patient presents with  . Lower Back - Follow-up    4 months post LEFT TRANSFORAMINAL LUMBAR INTERBODY FUSION L3-4 WITH DEPUY MPACT PEDICLE SCREWS AND RODS AND CAGE L3-4, DECOMPRESSION/CENTRAL LAMINECTOMY L4-5     HPI  Review of Systems   Objective: Vital Signs: BP (!) 167/69 (BP Location: Left Arm, Patient Position: Sitting)   Pulse 83   Ht 5' (1.524 m)   Wt 130 lb (59 kg)   BMI 25.39 kg/m   Physical Exam  Ortho Exam  Specialty Comments:  No specialty comments available.  Imaging: No results found.   PMFS History: Patient Active Problem List   Diagnosis Date Noted  . Spondylolisthesis, lumbar region 09/10/2018    Class: Chronic  . Spinal stenosis of lumbar region with neurogenic claudication 09/10/2018    Class: Chronic  . Anemia due  to acute blood loss 09/10/2018  . Fusion of spine of lumbar region 09/09/2018  . Dementia ([place]) 12/23/2017  . Syncope 12/23/2017  . Heart murmur 12/23/2017  . Unilateral primary osteoarthritis, right knee 10/23/2017  . Unilateral primary osteoarthritis, left knee 09/23/2017  . Chronic pain of left knee 08/08/2017  . Pain in right knee 08/08/2017  . Sciatica, left side 08/08/2017  . Memory loss 12/03/2013   Past Medical History:  Diagnosis Date  . Anxiety state, unspecified   . Arthritis   . Carpal tunnel syndrome    left  . Chiari malformation type I ([place])   . Depression   . Disorder of bone and cartilage, unspecified   . Encounter for long-term (current) use of other medications   . Esophageal reflux   . Headache   . High risk medication use   . Insomnia   . Memory loss    MMSE 24/30 on 12/05/11, 30/30 on 10/25/13  . Memory loss   . Menopausal symptoms   . Osteopenia    Hx of vitamin D deficiency  . Osteopenia   . Symptomatic menopausal or female climacteric states   . White matter abnormality on MRI of brain     Family History  Problem Relation Age of Onset  . Diabetes Mother   .  Osteoarthritis Mother   . Hypertension Mother   . Dementia Mother   . Stroke Father   . Heart attack Brother   . CAD Brother   . Breast cancer Maternal Aunt     Past Surgical History:  Procedure Laterality Date  . APPENDECTOMY    . CARPAL TUNNEL RELEASE Left   . CATARACT EXTRACTION, BILATERAL    . COLONOSCOPY    . LUMBAR FUSION  09/09/2018   Social History   Occupational History  . 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 Smoking status: Never Smoker  . Smokeless tobacco: Never Used  Substance and Sexual Activity  . Alcohol use: No    Alcohol/week: 0.0 standard drinks  . Drug use: No  . Sexual activity: Not on file

## 2019-01-02 MED ORDER — LIDOCAINE HCL 1 % IJ SOLN
3.0000 mL | INTRAMUSCULAR | Status: AC | PRN
  Administered 2019-01-01: 3 mL

## 2019-01-02 MED ORDER — METHYLPREDNISOLONE ACETATE 40 MG/ML IJ SUSP
80.0000 mg | INTRAMUSCULAR | Status: AC | PRN
  Administered 2019-01-01: 80 mg via INTRA_ARTICULAR

## 2019-01-02 MED ORDER — BUPIVACAINE HCL 0.25 % IJ SOLN
6.0000 mL | INTRAMUSCULAR | Status: AC | PRN
  Administered 2019-01-01: 6 mL via INTRA_ARTICULAR

## 2019-01-14 ENCOUNTER — Telehealth: Payer: Self-pay | Admitting: Specialist

## 2019-01-14 NOTE — Telephone Encounter (Signed)
Patient spouse Hollice Espy called to state patient is in so much pain and did NOT want to see PA.   Wanted to be added to cancellation list. [phone]

## 2019-01-16 NOTE — Telephone Encounter (Signed)
Placed on the cancellation list

## 2019-02-03 ENCOUNTER — Ambulatory Visit (INDEPENDENT_AMBULATORY_CARE_PROVIDER_SITE_OTHER): Payer: Medicare Other | Admitting: Specialist

## 2019-02-03 ENCOUNTER — Other Ambulatory Visit: Payer: Self-pay

## 2019-02-03 ENCOUNTER — Ambulatory Visit (INDEPENDENT_AMBULATORY_CARE_PROVIDER_SITE_OTHER): Payer: Medicare Other

## 2019-02-03 ENCOUNTER — Encounter: Payer: Self-pay | Admitting: Specialist

## 2019-02-03 VITALS — BP 122/68 | HR 77 | Ht 60.0 in | Wt 130.0 lb

## 2019-02-03 DIAGNOSIS — M4316 Spondylolisthesis, lumbar region: Secondary | ICD-10-CM | POA: Diagnosis not present

## 2019-02-03 DIAGNOSIS — Z981 Arthrodesis status: Secondary | ICD-10-CM | POA: Diagnosis not present

## 2019-02-03 MED ORDER — CELECOXIB 200 MG PO CAPS: 200.0000 mg | ORAL_CAPSULE | Freq: Every day | ORAL | 3 refills | Status: DC

## 2019-02-03 NOTE — Progress Notes (Addendum)
Office Visit Note   Patient: Carly Larson           Date of Birth: 05-15-1951           MRN: [id number] Visit Date: 02/03/2019              Requested by: Cyndi Bender, PA-C [address] [place],   [address] PCP: Cyndi Bender, PA-C   Assessment & Plan: Visit Diagnoses:  1. S/P lumbar spinal fusion   2. Spondylolisthesis, lumbar region     Plan: Avoid frequent bending and stooping  No lifting greater than 10 lbs. May use ice or moist heat for pain. Weight loss is of benefit. Best medication for lumbar disc disease is arthritis medications like motrin. Exercise is important to improve your indurance and does allow people to function better inspite of back pain.  Follow-Up Instructions: Return in about 6 months (around 08/06/2019).   Orders:  Orders Placed This Encounter  Procedures  . XR Lumbar Spine 2-3 Views   Meds ordered this encounter  Medications  . celecoxib (CELEBREX) 200 MG capsule    Sig: Take 1 capsule (200 mg total) by mouth daily.    Dispense:  30 capsule    Refill:  3      Procedures: No procedures performed   Clinical Data: No additional findings.   Subjective: Chief Complaint  Patient presents with  . Lower Back - Follow-up    2 weeks ago she could not walk due to pain in inner thighs on both legs  68 year old female with one level lumbar fusion L3-4 for spondylolisthesis 09/09/2018. Her fusion appears solid but there is radiographic signs of minimal anterolisthesis at L4-5. The pain she had previously was adductor strain. Clinically today no focal motor deficient and she has no pain. Will see her back in 6 months to assess healing of the L4-5 fusion but also to assess for new spondylolisthesis at L4-5. celebrex 200 mg per day restarted.  68 year old female now 5 months following L3-4 TILF with pedicle screws and rods with cement augmentation of screw fixation. She reports an episode of severe pain in the medial thighs and discomfort with  bending and stooping. Seen by Cyndi Bender and had radiographs and evaluation, felt to have an acute sprain. She reports the medial thigh pain is better. No bowel or bladder difficulty. She is standing and walking. Husband wants to know when she will be able to return to bending and stooping. Not taking any narcotics and her leg pain is better.     Review of Systems  Constitutional: Positive for unexpected weight change. Negative for activity change, appetite change, chills, diaphoresis, fatigue and fever.  HENT: Negative for congestion, dental problem, drooling, ear discharge, ear pain, facial swelling, hearing loss, mouth sores, nosebleeds, postnasal drip, rhinorrhea, sinus pressure, sinus pain, sneezing, sore throat, tinnitus, trouble swallowing and voice change.   Eyes: Negative for photophobia, pain, discharge, redness, itching and visual disturbance.  Respiratory: Negative.   Cardiovascular: Negative.   Gastrointestinal: Negative.   Endocrine: Negative.   Genitourinary: Negative.  Negative for difficulty urinating, dyspareunia, dysuria and enuresis.  Musculoskeletal: Positive for back pain. Negative for arthralgias, gait problem, joint swelling, myalgias, neck pain and neck stiffness.  Skin: Negative.   Allergic/Immunologic: Negative.   Neurological: Negative.   Hematological: Negative.   Psychiatric/Behavioral: Negative.      Objective: Vital Signs: BP 122/68 (BP Location: Left Arm, Patient Position: Sitting)   Pulse 77  Ht 5' (1.524 m)   Wt 130 lb (59 kg)   BMI 25.39 kg/m   Physical Exam  Back Exam   Range of Motion  Lateral bend right: normal  Rotation right: normal  Rotation left: normal   Muscle Strength  Right Quadriceps:  5/5  Left Quadriceps:  5/5  Right Hamstrings:  5/5  Left Hamstrings:  5/5   Tests  Straight leg raise right: negative Straight leg raise left: negative  Reflexes  Patellar: normal Achilles: normal Biceps: normal Babinski's sign:  normal   Other  Toe walk: normal Heel walk: normal Gait: normal  Scars: present      Specialty Comments:  No specialty comments available.  Imaging: Xr Lumbar Spine 2-3 Views  Result Date: 02/03/2019 Lumbar AP and lateral flexion and extension radiographs show the fusion at L3-4 with grade 1 spondylolisthesis the interbody appears solid and the instrumentation intact with cement stabilization. There is lesser spondylolisthesis at L4-5 not moving with flexion and extension.     PMFS History: Patient Active Problem List   Diagnosis Date Noted  . Spondylolisthesis, lumbar region 09/10/2018    Priority: High    Class: Chronic  . Spinal stenosis of lumbar region with neurogenic claudication 09/10/2018    Priority: High    Class: Chronic  . Anemia due to acute blood loss 09/10/2018    Priority: High  . Fusion of spine of lumbar region 09/09/2018  . Dementia ([place]) 12/23/2017  . Syncope 12/23/2017  . Heart murmur 12/23/2017  . Unilateral primary osteoarthritis, right knee 10/23/2017  . Unilateral primary osteoarthritis, left knee 09/23/2017  . Chronic pain of left knee 08/08/2017  . Pain in right knee 08/08/2017  . Sciatica, left side 08/08/2017  . Memory loss 12/03/2013   Past Medical History:  Diagnosis Date  . Anxiety state, unspecified   . Arthritis   . Carpal tunnel syndrome    left  . Chiari malformation type I ([place])   . Depression   . Disorder of bone and cartilage, unspecified   . Encounter for long-term (current) use of other medications   . Esophageal reflux   . Headache   . High risk medication use   . Insomnia   . Memory loss    MMSE 24/30 on 12/05/11, 30/30 on 10/25/13  . Memory loss   . Menopausal symptoms   . Osteopenia    Hx of vitamin D deficiency  . Osteopenia   . Symptomatic menopausal or female climacteric states   . White matter abnormality on MRI of brain     Family History  Problem Relation Age of Onset  . Diabetes Mother   .  Osteoarthritis Mother   . Hypertension Mother   . Dementia Mother   . Stroke Father   . Heart attack Brother   . CAD Brother   . Breast cancer Maternal Aunt     Past Surgical History:  Procedure Laterality Date  . APPENDECTOMY    . CARPAL TUNNEL RELEASE Left   . CATARACT EXTRACTION, BILATERAL    . COLONOSCOPY    . LUMBAR FUSION  09/09/2018   Social History   Occupational History  . 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 Smoking status: Never Smoker  . Smokeless tobacco: Never Used  Substance and Sexual Activity  . Alcohol use: No    Alcohol/week: 0.0 standard drinks  . Drug use: No  . Sexual activity: Not on file

## 2019-02-03 NOTE — Patient Instructions (Signed)
Avoid frequent bending and stooping  No lifting greater than 10 lbs. May use ice or moist heat for pain. Weight loss is of benefit. Best medication for lumbar disc disease is arthritis medications like motrin. Exercise is important to improve your indurance and does allow people to function better inspite of back pain.

## 2019-02-12 ENCOUNTER — Ambulatory Visit: Payer: Medicare Other | Admitting: Specialist

## 2019-03-05 ENCOUNTER — Ambulatory Visit: Payer: Medicare Other | Admitting: Specialist

## 2019-03-24 DIAGNOSIS — F0390 Unspecified dementia without behavioral disturbance: Secondary | ICD-10-CM | POA: Diagnosis not present

## 2019-03-24 DIAGNOSIS — M8580 Other specified disorders of bone density and structure, unspecified site: Secondary | ICD-10-CM | POA: Diagnosis not present

## 2019-03-24 DIAGNOSIS — M48061 Spinal stenosis, lumbar region without neurogenic claudication: Secondary | ICD-10-CM | POA: Diagnosis not present

## 2019-03-24 DIAGNOSIS — Z79899 Other long term (current) drug therapy: Secondary | ICD-10-CM | POA: Diagnosis not present

## 2019-03-24 DIAGNOSIS — M25562 Pain in left knee: Secondary | ICD-10-CM | POA: Diagnosis not present

## 2019-04-15 DIAGNOSIS — Z23 Encounter for immunization: Secondary | ICD-10-CM | POA: Diagnosis not present

## 2019-05-20 DIAGNOSIS — Z20828 Contact with and (suspected) exposure to other viral communicable diseases: Secondary | ICD-10-CM | POA: Diagnosis not present

## 2019-05-21 DIAGNOSIS — Z9181 History of falling: Secondary | ICD-10-CM | POA: Diagnosis not present

## 2019-05-21 DIAGNOSIS — Z0000 Encounter for general adult medical examination without abnormal findings: Secondary | ICD-10-CM | POA: Diagnosis not present

## 2019-05-21 DIAGNOSIS — Z1331 Encounter for screening for depression: Secondary | ICD-10-CM | POA: Diagnosis not present

## 2019-05-21 DIAGNOSIS — Z1231 Encounter for screening mammogram for malignant neoplasm of breast: Secondary | ICD-10-CM | POA: Diagnosis not present

## 2019-05-21 DIAGNOSIS — E785 Hyperlipidemia, unspecified: Secondary | ICD-10-CM | POA: Diagnosis not present

## 2019-07-07 ENCOUNTER — Other Ambulatory Visit (INDEPENDENT_AMBULATORY_CARE_PROVIDER_SITE_OTHER): Payer: Self-pay | Admitting: Orthopaedic Surgery

## 2019-07-07 ENCOUNTER — Other Ambulatory Visit (INDEPENDENT_AMBULATORY_CARE_PROVIDER_SITE_OTHER): Payer: Self-pay | Admitting: Surgery

## 2019-07-07 ENCOUNTER — Other Ambulatory Visit: Payer: Self-pay | Admitting: Specialist

## 2019-07-07 NOTE — Telephone Encounter (Signed)
Please advise 

## 2019-08-05 ENCOUNTER — Other Ambulatory Visit: Payer: Self-pay | Admitting: Physician Assistant

## 2019-08-05 DIAGNOSIS — Z1231 Encounter for screening mammogram for malignant neoplasm of breast: Secondary | ICD-10-CM

## 2019-08-06 ENCOUNTER — Ambulatory Visit: Payer: Medicare Other | Admitting: Specialist

## 2019-09-14 ENCOUNTER — Ambulatory Visit: Payer: Medicare Other

## 2019-10-01 ENCOUNTER — Ambulatory Visit
Admission: RE | Admit: 2019-10-01 | Discharge: 2019-10-01 | Disposition: A | Discharge status: Home / Self Care | Payer: Medicare Other | Source: Ambulatory Visit | Attending: Physician Assistant | Admitting: Physician Assistant

## 2019-10-01 ENCOUNTER — Other Ambulatory Visit: Payer: Self-pay

## 2019-10-01 DIAGNOSIS — Z1231 Encounter for screening mammogram for malignant neoplasm of breast: Secondary | ICD-10-CM

## 2019-10-13 DIAGNOSIS — M179 Osteoarthritis of knee, unspecified: Secondary | ICD-10-CM | POA: Diagnosis not present

## 2019-10-13 DIAGNOSIS — Z79899 Other long term (current) drug therapy: Secondary | ICD-10-CM | POA: Diagnosis not present

## 2019-10-13 DIAGNOSIS — Z139 Encounter for screening, unspecified: Secondary | ICD-10-CM | POA: Diagnosis not present

## 2019-10-13 DIAGNOSIS — F0390 Unspecified dementia without behavioral disturbance: Secondary | ICD-10-CM | POA: Diagnosis not present

## 2019-10-13 DIAGNOSIS — M8580 Other specified disorders of bone density and structure, unspecified site: Secondary | ICD-10-CM | POA: Diagnosis not present

## 2019-10-13 DIAGNOSIS — M48061 Spinal stenosis, lumbar region without neurogenic claudication: Secondary | ICD-10-CM | POA: Diagnosis not present

## 2019-10-20 ENCOUNTER — Other Ambulatory Visit: Payer: Self-pay | Admitting: Radiology

## 2019-10-21 MED ORDER — VITAMIN D 25 MCG (1000 UNIT) PO TABS: 1000.0000 [IU] | ORAL_TABLET | Freq: Every day | ORAL | 6 refills | Status: AC

## 2019-11-13 DIAGNOSIS — R4182 Altered mental status, unspecified: Secondary | ICD-10-CM | POA: Diagnosis not present

## 2019-11-13 DIAGNOSIS — R509 Fever, unspecified: Secondary | ICD-10-CM | POA: Diagnosis not present

## 2019-11-13 DIAGNOSIS — G8929 Other chronic pain: Secondary | ICD-10-CM | POA: Diagnosis not present

## 2019-11-13 DIAGNOSIS — T402X1A Poisoning by other opioids, accidental (unintentional), initial encounter: Secondary | ICD-10-CM | POA: Diagnosis not present

## 2019-11-13 DIAGNOSIS — J9690 Respiratory failure, unspecified, unspecified whether with hypoxia or hypercapnia: Secondary | ICD-10-CM | POA: Diagnosis not present

## 2019-11-13 DIAGNOSIS — R569 Unspecified convulsions: Secondary | ICD-10-CM | POA: Diagnosis not present

## 2019-11-13 DIAGNOSIS — Z4682 Encounter for fitting and adjustment of non-vascular catheter: Secondary | ICD-10-CM | POA: Diagnosis not present

## 2019-11-13 DIAGNOSIS — Z79899 Other long term (current) drug therapy: Secondary | ICD-10-CM | POA: Diagnosis not present

## 2019-11-13 DIAGNOSIS — S70219S Abrasion, unspecified hip, sequela: Secondary | ICD-10-CM | POA: Diagnosis not present

## 2019-11-13 DIAGNOSIS — R0902 Hypoxemia: Secondary | ICD-10-CM | POA: Diagnosis not present

## 2019-11-13 DIAGNOSIS — G9340 Encephalopathy, unspecified: Secondary | ICD-10-CM | POA: Diagnosis not present

## 2019-11-13 DIAGNOSIS — M810 Age-related osteoporosis without current pathological fracture: Secondary | ICD-10-CM | POA: Diagnosis not present

## 2019-11-13 DIAGNOSIS — K219 Gastro-esophageal reflux disease without esophagitis: Secondary | ICD-10-CM | POA: Diagnosis present

## 2019-11-13 DIAGNOSIS — M8580 Other specified disorders of bone density and structure, unspecified site: Secondary | ICD-10-CM | POA: Diagnosis present

## 2019-11-13 DIAGNOSIS — T402X1D Poisoning by other opioids, accidental (unintentional), subsequent encounter: Secondary | ICD-10-CM | POA: Diagnosis not present

## 2019-11-13 DIAGNOSIS — R404 Transient alteration of awareness: Secondary | ICD-10-CM | POA: Diagnosis not present

## 2019-11-13 DIAGNOSIS — R4020 Unspecified coma: Secondary | ICD-10-CM | POA: Diagnosis not present

## 2019-11-13 DIAGNOSIS — I10 Essential (primary) hypertension: Secondary | ICD-10-CM | POA: Diagnosis not present

## 2019-11-13 DIAGNOSIS — J9600 Acute respiratory failure, unspecified whether with hypoxia or hypercapnia: Secondary | ICD-10-CM | POA: Diagnosis not present

## 2019-11-13 DIAGNOSIS — Z981 Arthrodesis status: Secondary | ICD-10-CM | POA: Diagnosis not present

## 2019-11-13 DIAGNOSIS — M549 Dorsalgia, unspecified: Secondary | ICD-10-CM | POA: Diagnosis present

## 2019-11-13 DIAGNOSIS — J9811 Atelectasis: Secondary | ICD-10-CM | POA: Diagnosis not present

## 2019-11-13 DIAGNOSIS — R5381 Other malaise: Secondary | ICD-10-CM | POA: Diagnosis not present

## 2019-11-13 DIAGNOSIS — F0390 Unspecified dementia without behavioral disturbance: Secondary | ICD-10-CM | POA: Diagnosis not present

## 2019-11-13 DIAGNOSIS — Z9911 Dependence on respirator [ventilator] status: Secondary | ICD-10-CM | POA: Diagnosis not present

## 2019-11-14 DIAGNOSIS — G9340 Encephalopathy, unspecified: Secondary | ICD-10-CM | POA: Diagnosis not present

## 2019-11-15 DIAGNOSIS — G9340 Encephalopathy, unspecified: Secondary | ICD-10-CM | POA: Diagnosis not present

## 2019-11-16 DIAGNOSIS — G9340 Encephalopathy, unspecified: Secondary | ICD-10-CM | POA: Diagnosis not present

## 2020-01-08 DIAGNOSIS — H26492 Other secondary cataract, left eye: Secondary | ICD-10-CM | POA: Diagnosis not present

## 2020-01-19 DIAGNOSIS — M8580 Other specified disorders of bone density and structure, unspecified site: Secondary | ICD-10-CM | POA: Diagnosis not present

## 2020-01-19 DIAGNOSIS — Z79899 Other long term (current) drug therapy: Secondary | ICD-10-CM | POA: Diagnosis not present

## 2020-01-19 DIAGNOSIS — Z136 Encounter for screening for cardiovascular disorders: Secondary | ICD-10-CM | POA: Diagnosis not present

## 2020-01-19 DIAGNOSIS — N1831 Chronic kidney disease, stage 3a: Secondary | ICD-10-CM | POA: Diagnosis not present

## 2020-01-29 DIAGNOSIS — Z09 Encounter for follow-up examination after completed treatment for conditions other than malignant neoplasm: Secondary | ICD-10-CM | POA: Diagnosis not present

## 2020-01-29 DIAGNOSIS — H26491 Other secondary cataract, right eye: Secondary | ICD-10-CM | POA: Diagnosis not present

## 2020-02-22 IMAGING — MG DIGITAL SCREENING BILATERAL MAMMOGRAM WITH TOMO AND CAD
6 of 10 series · 6 of 30 positions shown · non-contrast
Comparison: Previous exam(s).

CLINICAL DATA: Screening.

EXAM:
DIGITAL SCREENING BILATERAL MAMMOGRAM WITH TOMO AND CAD

[L CC synth-2D]
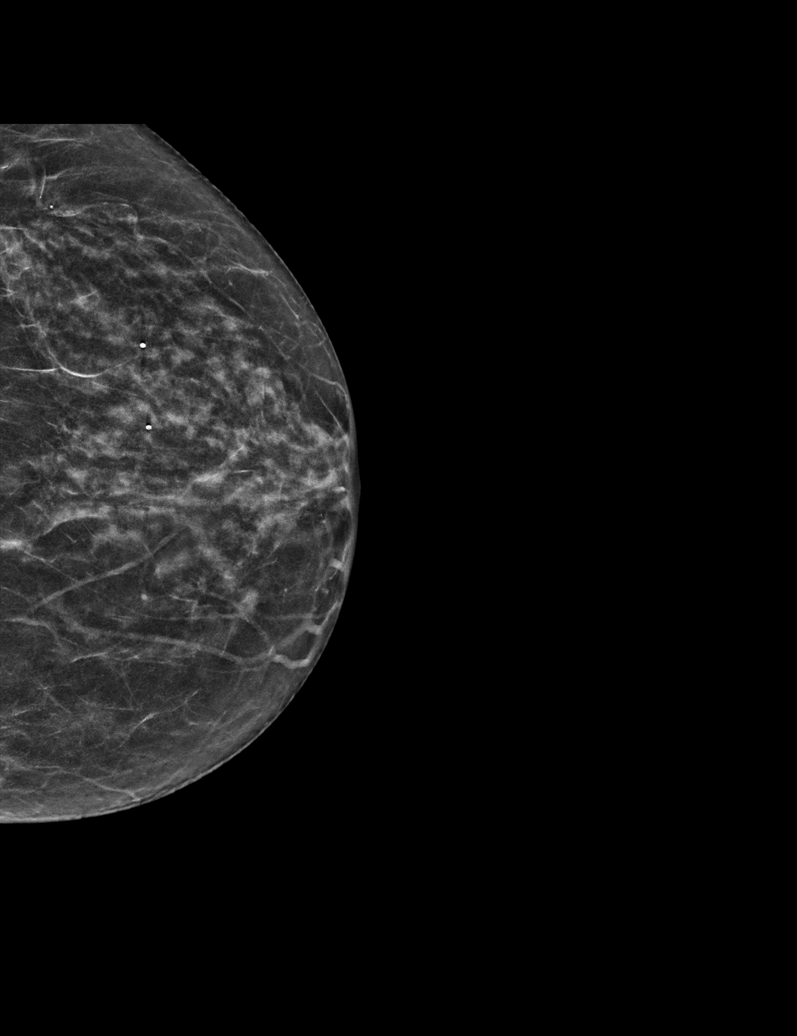

[R MLO synth-2D]
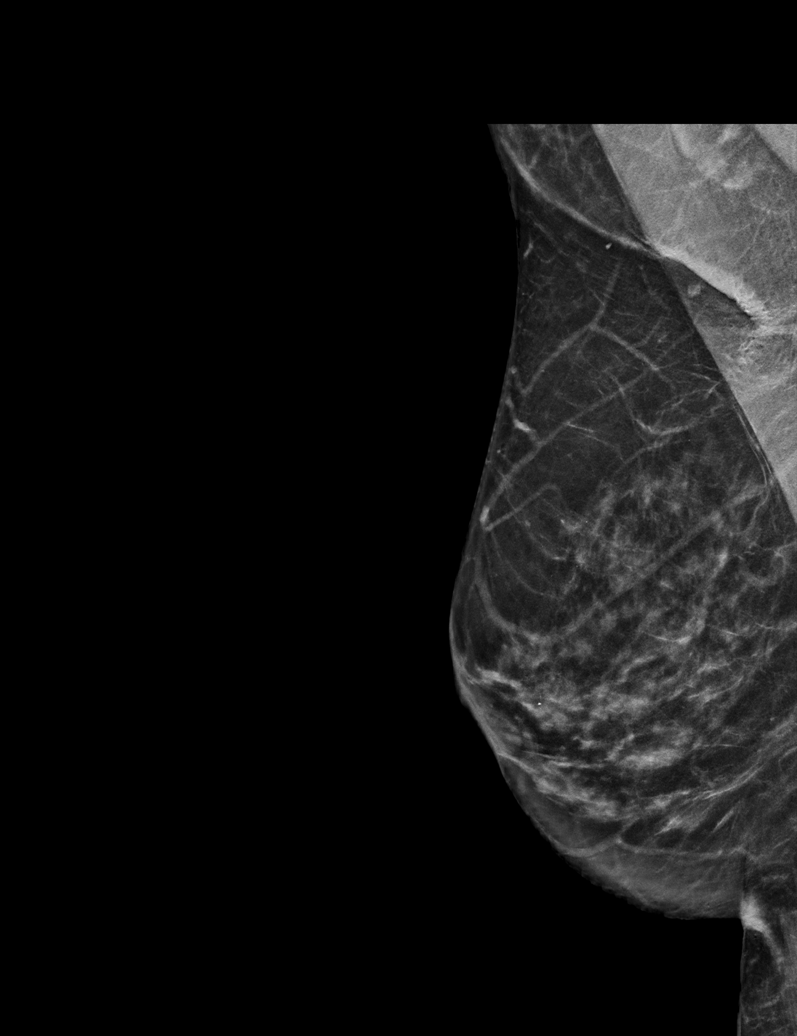

[R CC synth-2D (1 of 2)]
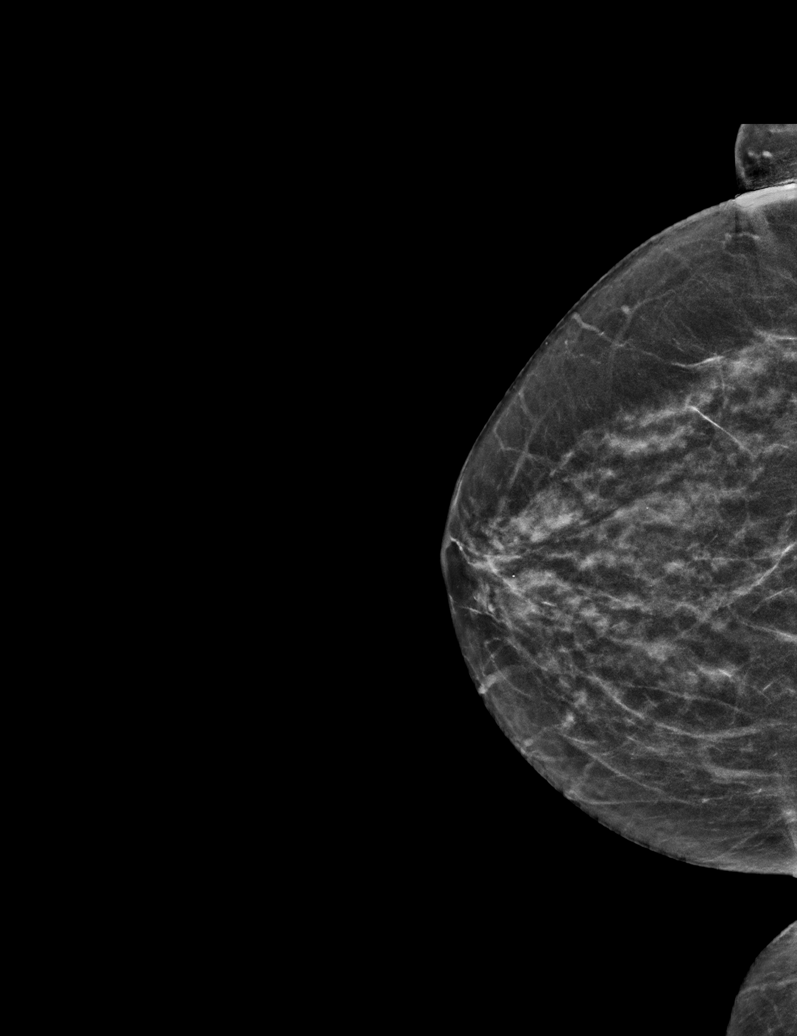

[R CC synth-2D (2 of 2)]
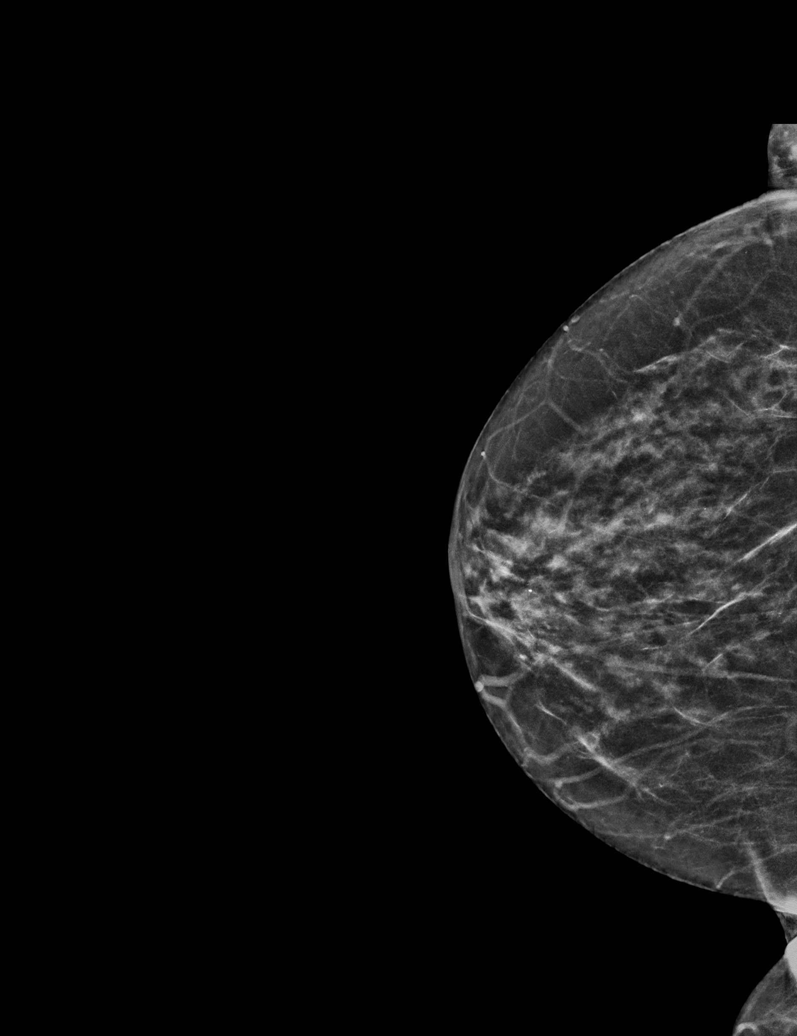

[L MLO synth-2D]
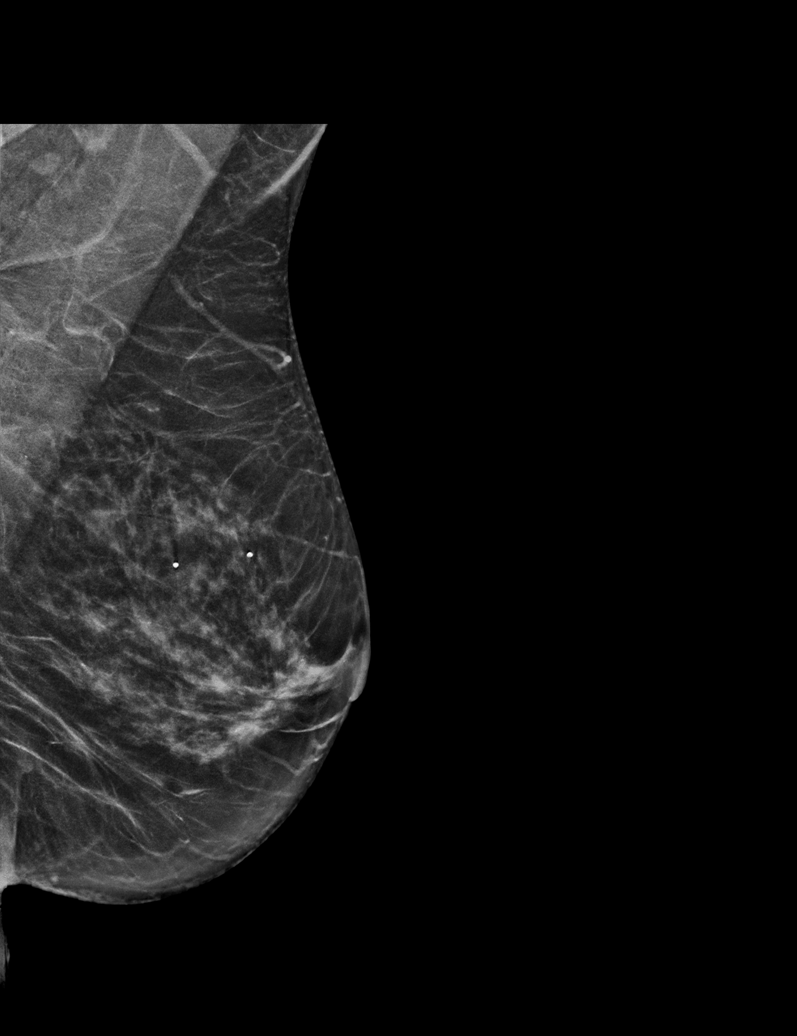

[L CC tomo · tomo slice 23/46.0]
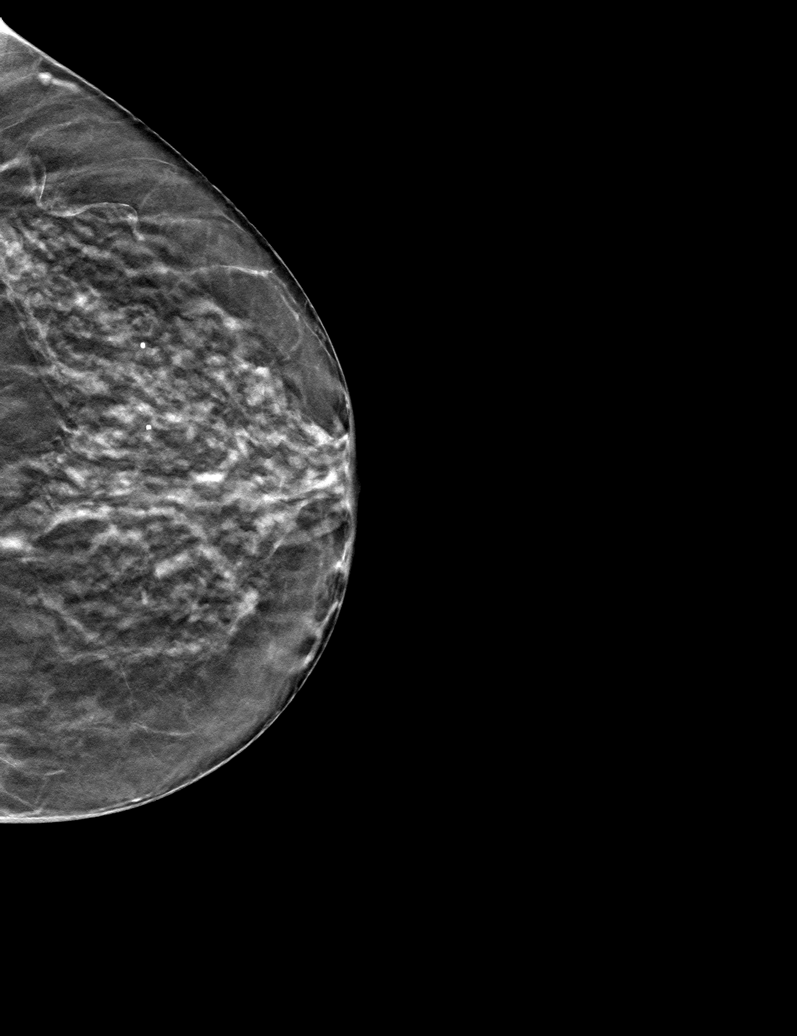

[6 of 30 positions shown; findings below may reference images not displayed]

ACR Breast Density Category c: The breast tissue is heterogeneously
dense, which may obscure small masses.
FINDINGS: There are no findings suspicious for malignancy. Images were
processed with CAD.
IMPRESSION: No mammographic evidence of malignancy. A result letter of this
screening mammogram will be mailed directly to the patient.

RECOMMENDATION:
Screening mammogram in one year. (Code:FT-U-LHB)

BI-RADS CATEGORY  1: Negative.

## 2020-04-11 DIAGNOSIS — Z23 Encounter for immunization: Secondary | ICD-10-CM | POA: Diagnosis not present

## 2020-04-14 DIAGNOSIS — M179 Osteoarthritis of knee, unspecified: Secondary | ICD-10-CM | POA: Diagnosis not present

## 2020-04-14 DIAGNOSIS — Z1331 Encounter for screening for depression: Secondary | ICD-10-CM | POA: Diagnosis not present

## 2020-04-14 DIAGNOSIS — M48061 Spinal stenosis, lumbar region without neurogenic claudication: Secondary | ICD-10-CM | POA: Diagnosis not present

## 2020-04-14 DIAGNOSIS — Z6827 Body mass index (BMI) 27.0-27.9, adult: Secondary | ICD-10-CM | POA: Diagnosis not present

## 2020-04-14 DIAGNOSIS — Z9181 History of falling: Secondary | ICD-10-CM | POA: Diagnosis not present

## 2020-04-14 DIAGNOSIS — G4700 Insomnia, unspecified: Secondary | ICD-10-CM | POA: Diagnosis not present

## 2020-04-14 DIAGNOSIS — F0390 Unspecified dementia without behavioral disturbance: Secondary | ICD-10-CM | POA: Diagnosis not present

## 2020-04-14 DIAGNOSIS — M8580 Other specified disorders of bone density and structure, unspecified site: Secondary | ICD-10-CM | POA: Diagnosis not present

## 2020-04-14 DIAGNOSIS — F418 Other specified anxiety disorders: Secondary | ICD-10-CM | POA: Diagnosis not present

## 2020-04-14 DIAGNOSIS — Z79899 Other long term (current) drug therapy: Secondary | ICD-10-CM | POA: Diagnosis not present

## 2020-05-15 IMAGING — RF DG LUMBAR SPINE COMPLETE 4+V
1 series · 4 of 4 positions shown · non-contrast
Comparison: None.

CLINICAL DATA: 67-year-old female with lumbar surgery

EXAM:
DG C-ARM 61-120 MIN; LUMBAR SPINE - COMPLETE 4+ VIEW

[Series 1: run · 4 of 4 slices shown]
[im 1/4]
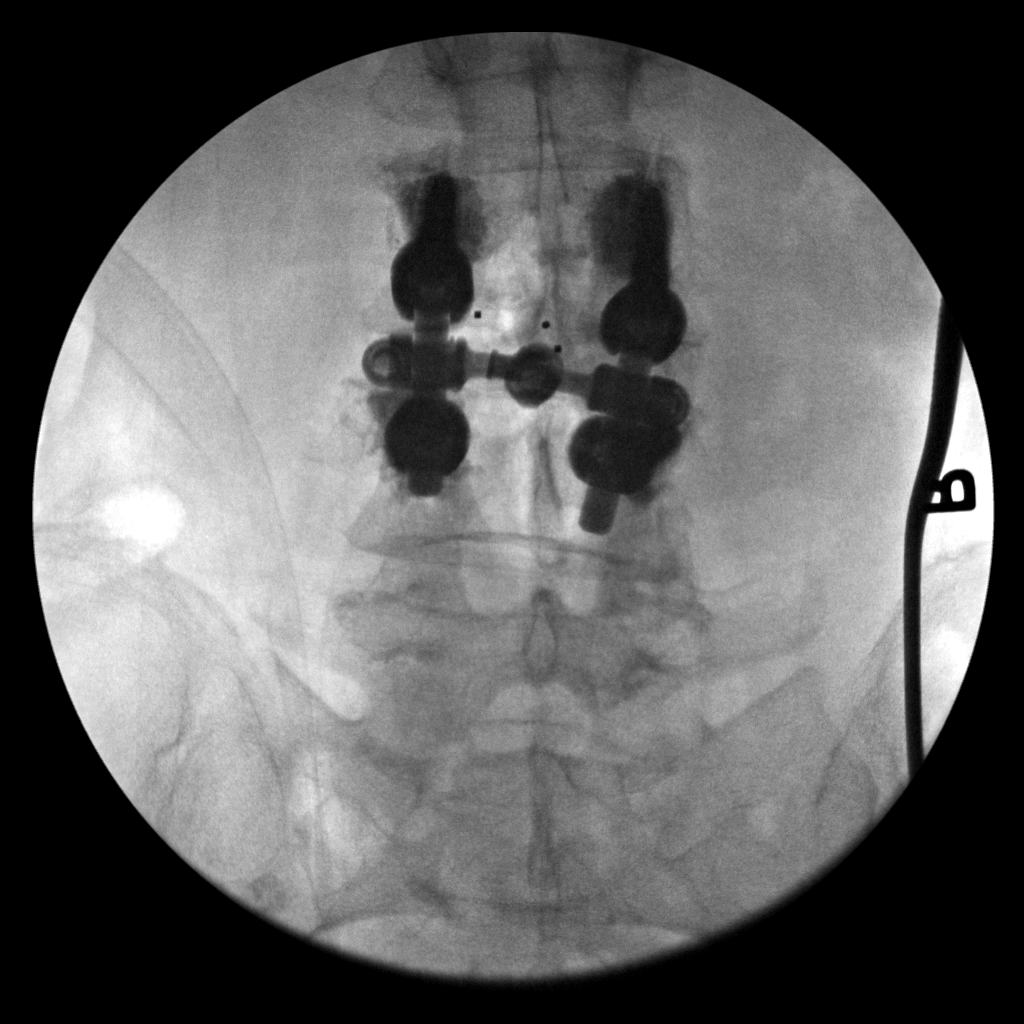
[im 2/4]
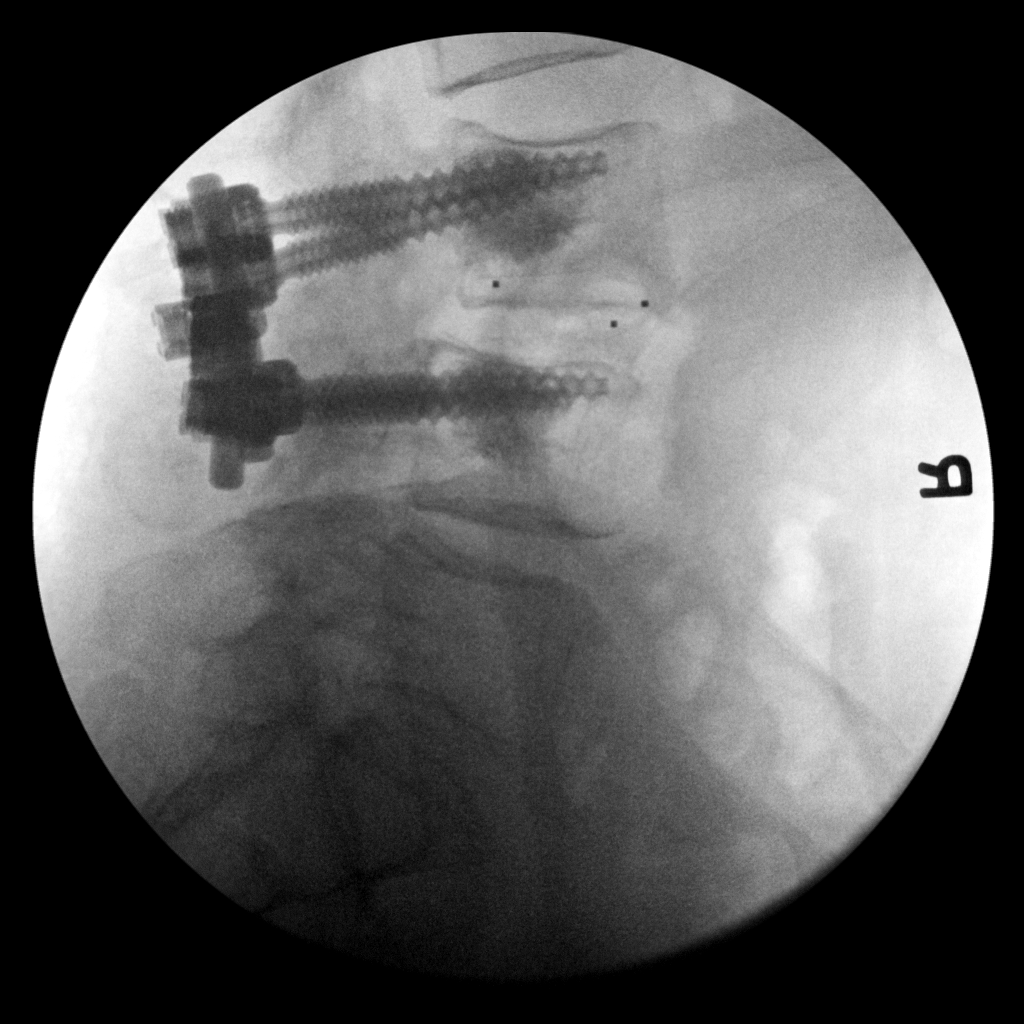
[im 3/4]
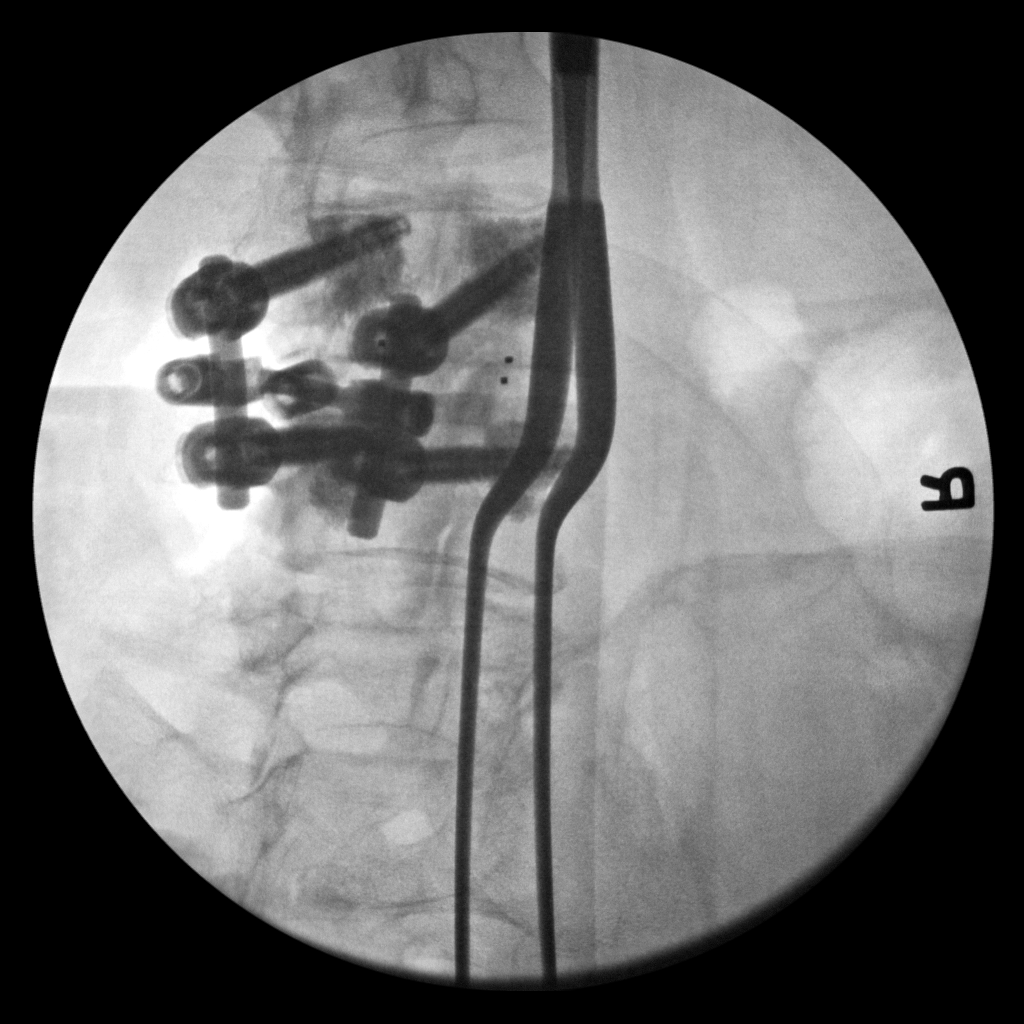
[im 4/4]
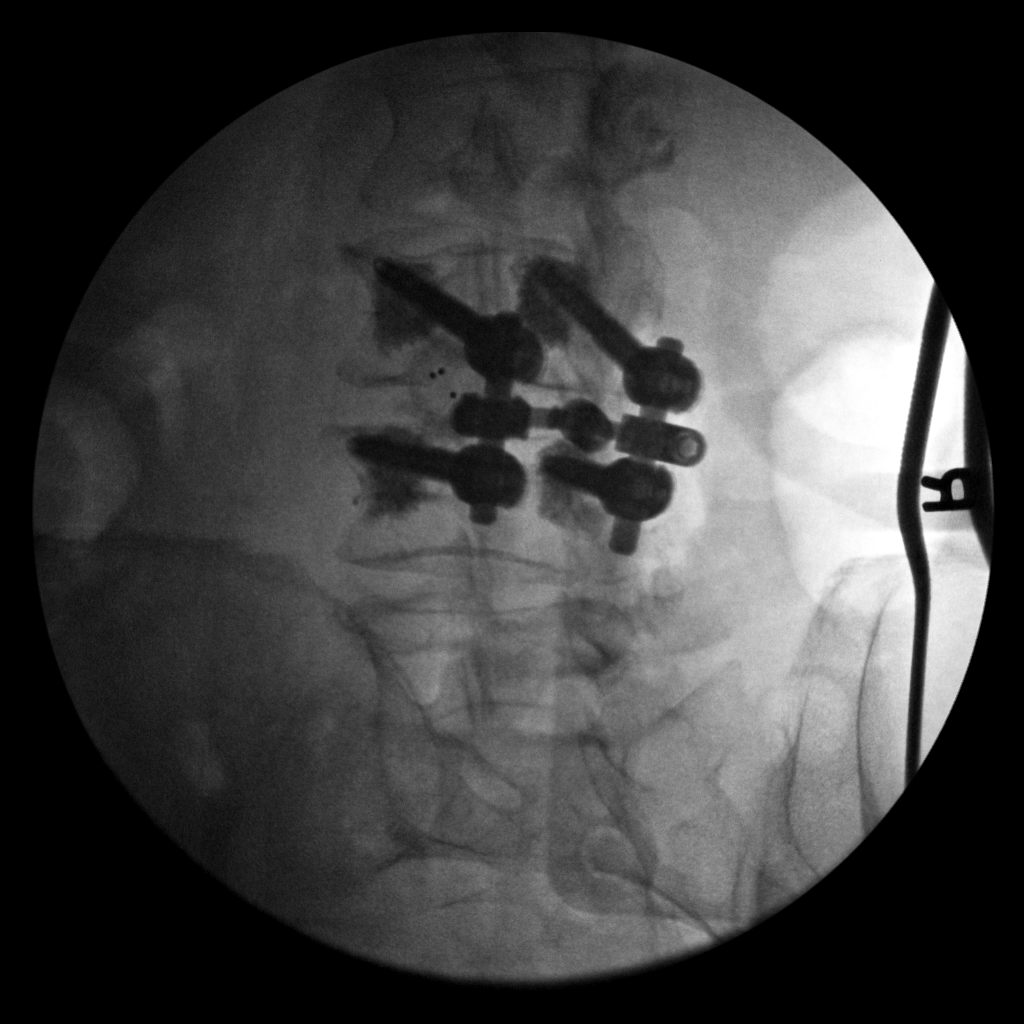

[4 of 4 positions shown; findings below may reference images not displayed]

FINDINGS: Limited intraoperative fluoroscopic spot images of lumbar spine
during posterior lumbar interbody fusion with bilateral pedicle
screw and rod fixation and cross fixation of L3-L4..
IMPRESSION: Limited intraoperative fluoroscopic spot images of L3-L4 posterior
lumbar interbody fusion with bilateral pedicle screw and rod
fixation. Please refer to the dictated operative report for full
details of intraoperative findings and procedure.

## 2020-08-22 ENCOUNTER — Other Ambulatory Visit: Payer: Self-pay | Admitting: Physician Assistant

## 2020-08-22 DIAGNOSIS — Z1231 Encounter for screening mammogram for malignant neoplasm of breast: Secondary | ICD-10-CM

## 2020-08-24 DIAGNOSIS — Z20822 Contact with and (suspected) exposure to covid-19: Secondary | ICD-10-CM | POA: Diagnosis not present

## 2020-09-01 DIAGNOSIS — G309 Alzheimer's disease, unspecified: Secondary | ICD-10-CM | POA: Diagnosis not present

## 2020-09-01 DIAGNOSIS — M48061 Spinal stenosis, lumbar region without neurogenic claudication: Secondary | ICD-10-CM | POA: Diagnosis not present

## 2020-09-01 DIAGNOSIS — M179 Osteoarthritis of knee, unspecified: Secondary | ICD-10-CM | POA: Diagnosis not present

## 2020-09-01 DIAGNOSIS — G4700 Insomnia, unspecified: Secondary | ICD-10-CM | POA: Diagnosis not present

## 2020-09-01 DIAGNOSIS — F418 Other specified anxiety disorders: Secondary | ICD-10-CM | POA: Diagnosis not present

## 2020-09-01 DIAGNOSIS — M8580 Other specified disorders of bone density and structure, unspecified site: Secondary | ICD-10-CM | POA: Diagnosis not present

## 2020-09-01 DIAGNOSIS — F0280 Dementia in other diseases classified elsewhere without behavioral disturbance: Secondary | ICD-10-CM | POA: Diagnosis not present

## 2020-10-04 ENCOUNTER — Ambulatory Visit: Payer: Medicare Other

## 2020-10-05 ENCOUNTER — Ambulatory Visit: Payer: Medicare Other

## 2020-10-14 DIAGNOSIS — N1831 Chronic kidney disease, stage 3a: Secondary | ICD-10-CM | POA: Diagnosis not present

## 2020-10-14 DIAGNOSIS — Z79899 Other long term (current) drug therapy: Secondary | ICD-10-CM | POA: Diagnosis not present

## 2020-10-14 DIAGNOSIS — G309 Alzheimer's disease, unspecified: Secondary | ICD-10-CM | POA: Diagnosis not present

## 2020-10-14 DIAGNOSIS — G4700 Insomnia, unspecified: Secondary | ICD-10-CM | POA: Diagnosis not present

## 2020-10-14 DIAGNOSIS — F418 Other specified anxiety disorders: Secondary | ICD-10-CM | POA: Diagnosis not present

## 2020-10-14 DIAGNOSIS — F0280 Dementia in other diseases classified elsewhere without behavioral disturbance: Secondary | ICD-10-CM | POA: Diagnosis not present

## 2020-10-14 DIAGNOSIS — Z139 Encounter for screening, unspecified: Secondary | ICD-10-CM | POA: Diagnosis not present

## 2020-10-14 DIAGNOSIS — M8580 Other specified disorders of bone density and structure, unspecified site: Secondary | ICD-10-CM | POA: Diagnosis not present

## 2020-10-14 DIAGNOSIS — M179 Osteoarthritis of knee, unspecified: Secondary | ICD-10-CM | POA: Diagnosis not present

## 2020-10-14 DIAGNOSIS — M48061 Spinal stenosis, lumbar region without neurogenic claudication: Secondary | ICD-10-CM | POA: Diagnosis not present

## 2020-11-14 DIAGNOSIS — D72829 Elevated white blood cell count, unspecified: Secondary | ICD-10-CM | POA: Diagnosis not present

## 2020-12-09 ENCOUNTER — Ambulatory Visit
Admission: RE | Admit: 2020-12-09 | Discharge: 2020-12-09 | Disposition: A | Discharge status: Home / Self Care | Payer: Medicare Other | Source: Ambulatory Visit | Attending: Physician Assistant | Admitting: Physician Assistant

## 2020-12-09 ENCOUNTER — Other Ambulatory Visit: Payer: Self-pay

## 2020-12-09 DIAGNOSIS — Z1231 Encounter for screening mammogram for malignant neoplasm of breast: Secondary | ICD-10-CM

## 2021-01-10 DIAGNOSIS — M7062 Trochanteric bursitis, left hip: Secondary | ICD-10-CM | POA: Diagnosis not present

## 2021-01-10 DIAGNOSIS — Z6833 Body mass index (BMI) 33.0-33.9, adult: Secondary | ICD-10-CM | POA: Diagnosis not present

## 2021-02-07 ENCOUNTER — Other Ambulatory Visit: Payer: Self-pay

## 2021-02-07 ENCOUNTER — Ambulatory Visit
Admission: RE | Admit: 2021-02-07 | Discharge: 2021-02-07 | Disposition: A | Discharge status: Home / Self Care | Payer: Medicare Other | Source: Ambulatory Visit | Attending: Physician Assistant | Admitting: Physician Assistant

## 2021-02-07 DIAGNOSIS — Z1231 Encounter for screening mammogram for malignant neoplasm of breast: Secondary | ICD-10-CM

## 2021-02-20 DIAGNOSIS — Z20822 Contact with and (suspected) exposure to covid-19: Secondary | ICD-10-CM | POA: Diagnosis not present

## 2021-03-23 DIAGNOSIS — N3942 Incontinence without sensory awareness: Secondary | ICD-10-CM | POA: Diagnosis not present

## 2021-03-23 DIAGNOSIS — N3281 Overactive bladder: Secondary | ICD-10-CM | POA: Diagnosis not present

## 2021-03-23 DIAGNOSIS — Z6832 Body mass index (BMI) 32.0-32.9, adult: Secondary | ICD-10-CM | POA: Diagnosis not present

## 2021-04-25 DIAGNOSIS — Z23 Encounter for immunization: Secondary | ICD-10-CM | POA: Diagnosis not present

## 2021-05-17 DIAGNOSIS — F0280 Dementia in other diseases classified elsewhere without behavioral disturbance: Secondary | ICD-10-CM | POA: Diagnosis not present

## 2021-05-17 DIAGNOSIS — N3281 Overactive bladder: Secondary | ICD-10-CM | POA: Diagnosis not present

## 2021-05-17 DIAGNOSIS — M8580 Other specified disorders of bone density and structure, unspecified site: Secondary | ICD-10-CM | POA: Diagnosis not present

## 2021-05-17 DIAGNOSIS — Z9181 History of falling: Secondary | ICD-10-CM | POA: Diagnosis not present

## 2021-05-17 DIAGNOSIS — G4700 Insomnia, unspecified: Secondary | ICD-10-CM | POA: Diagnosis not present

## 2021-05-17 DIAGNOSIS — M48061 Spinal stenosis, lumbar region without neurogenic claudication: Secondary | ICD-10-CM | POA: Diagnosis not present

## 2021-05-17 DIAGNOSIS — N1831 Chronic kidney disease, stage 3a: Secondary | ICD-10-CM | POA: Diagnosis not present

## 2021-05-17 DIAGNOSIS — F418 Other specified anxiety disorders: Secondary | ICD-10-CM | POA: Diagnosis not present

## 2021-05-17 DIAGNOSIS — G309 Alzheimer's disease, unspecified: Secondary | ICD-10-CM | POA: Diagnosis not present

## 2021-05-17 DIAGNOSIS — Z1331 Encounter for screening for depression: Secondary | ICD-10-CM | POA: Diagnosis not present

## 2021-05-17 DIAGNOSIS — M179 Osteoarthritis of knee, unspecified: Secondary | ICD-10-CM | POA: Diagnosis not present

## 2021-05-17 DIAGNOSIS — I10 Essential (primary) hypertension: Secondary | ICD-10-CM | POA: Diagnosis not present

## 2021-06-13 DIAGNOSIS — R748 Abnormal levels of other serum enzymes: Secondary | ICD-10-CM | POA: Diagnosis not present

## 2021-06-13 DIAGNOSIS — D72829 Elevated white blood cell count, unspecified: Secondary | ICD-10-CM | POA: Diagnosis not present

## 2021-07-03 DIAGNOSIS — Z20828 Contact with and (suspected) exposure to other viral communicable diseases: Secondary | ICD-10-CM | POA: Diagnosis not present

## 2021-08-22 DIAGNOSIS — I10 Essential (primary) hypertension: Secondary | ICD-10-CM | POA: Diagnosis not present

## 2021-09-06 DIAGNOSIS — R69 Illness, unspecified: Secondary | ICD-10-CM | POA: Diagnosis not present

## 2021-09-06 DIAGNOSIS — M8580 Other specified disorders of bone density and structure, unspecified site: Secondary | ICD-10-CM | POA: Diagnosis not present

## 2021-09-06 DIAGNOSIS — M179 Osteoarthritis of knee, unspecified: Secondary | ICD-10-CM | POA: Diagnosis not present

## 2021-09-06 DIAGNOSIS — N1831 Chronic kidney disease, stage 3a: Secondary | ICD-10-CM | POA: Diagnosis not present

## 2021-09-06 DIAGNOSIS — G309 Alzheimer's disease, unspecified: Secondary | ICD-10-CM | POA: Diagnosis not present

## 2021-09-06 DIAGNOSIS — I10 Essential (primary) hypertension: Secondary | ICD-10-CM | POA: Diagnosis not present

## 2021-09-06 DIAGNOSIS — N3281 Overactive bladder: Secondary | ICD-10-CM | POA: Diagnosis not present

## 2021-09-06 DIAGNOSIS — M48061 Spinal stenosis, lumbar region without neurogenic claudication: Secondary | ICD-10-CM | POA: Diagnosis not present

## 2021-09-06 DIAGNOSIS — G4700 Insomnia, unspecified: Secondary | ICD-10-CM | POA: Diagnosis not present

## 2021-09-19 DIAGNOSIS — G301 Alzheimer's disease with late onset: Secondary | ICD-10-CM | POA: Diagnosis not present

## 2021-09-19 DIAGNOSIS — I10 Essential (primary) hypertension: Secondary | ICD-10-CM | POA: Diagnosis not present

## 2021-09-19 DIAGNOSIS — R69 Illness, unspecified: Secondary | ICD-10-CM | POA: Diagnosis not present

## 2021-10-06 DIAGNOSIS — Z20822 Contact with and (suspected) exposure to covid-19: Secondary | ICD-10-CM | POA: Diagnosis not present

## 2021-10-20 DIAGNOSIS — I10 Essential (primary) hypertension: Secondary | ICD-10-CM | POA: Diagnosis not present

## 2021-10-20 DIAGNOSIS — R69 Illness, unspecified: Secondary | ICD-10-CM | POA: Diagnosis not present

## 2021-10-20 DIAGNOSIS — G301 Alzheimer's disease with late onset: Secondary | ICD-10-CM | POA: Diagnosis not present

## 2021-11-23 DIAGNOSIS — Z20822 Contact with and (suspected) exposure to covid-19: Secondary | ICD-10-CM | POA: Diagnosis not present

## 2022-01-01 ENCOUNTER — Emergency Department (HOSPITAL_COMMUNITY): Payer: Medicare HMO

## 2022-01-01 ENCOUNTER — Inpatient Hospital Stay (HOSPITAL_COMMUNITY)
Admission: EM | Admit: 2022-01-01 | Discharge: 2022-01-12 | DRG: 082 | Disposition: A | Discharge status: Skilled Nursing Facility | Payer: Medicare HMO | Attending: Internal Medicine | Admitting: Internal Medicine

## 2022-01-01 DIAGNOSIS — S01111A Laceration without foreign body of right eyelid and periocular area, initial encounter: Secondary | ICD-10-CM | POA: Diagnosis present

## 2022-01-01 DIAGNOSIS — E876 Hypokalemia: Secondary | ICD-10-CM | POA: Diagnosis not present

## 2022-01-01 DIAGNOSIS — Y92008 Other place in unspecified non-institutional (private) residence as the place of occurrence of the external cause: Secondary | ICD-10-CM

## 2022-01-01 DIAGNOSIS — K219 Gastro-esophageal reflux disease without esophagitis: Secondary | ICD-10-CM | POA: Diagnosis present

## 2022-01-01 DIAGNOSIS — G9341 Metabolic encephalopathy: Secondary | ICD-10-CM | POA: Diagnosis present

## 2022-01-01 DIAGNOSIS — N179 Acute kidney failure, unspecified: Secondary | ICD-10-CM | POA: Diagnosis not present

## 2022-01-01 DIAGNOSIS — D72828 Other elevated white blood cell count: Secondary | ICD-10-CM | POA: Diagnosis not present

## 2022-01-01 DIAGNOSIS — Z818 Family history of other mental and behavioral disorders: Secondary | ICD-10-CM

## 2022-01-01 DIAGNOSIS — W1839XA Other fall on same level, initial encounter: Secondary | ICD-10-CM | POA: Diagnosis present

## 2022-01-01 DIAGNOSIS — Z781 Physical restraint status: Secondary | ICD-10-CM

## 2022-01-01 DIAGNOSIS — Z79899 Other long term (current) drug therapy: Secondary | ICD-10-CM

## 2022-01-01 DIAGNOSIS — E878 Other disorders of electrolyte and fluid balance, not elsewhere classified: Secondary | ICD-10-CM | POA: Diagnosis present

## 2022-01-01 DIAGNOSIS — S06360A Traumatic hemorrhage of cerebrum, unspecified, without loss of consciousness, initial encounter: Secondary | ICD-10-CM | POA: Diagnosis not present

## 2022-01-01 DIAGNOSIS — S299XXA Unspecified injury of thorax, initial encounter: Secondary | ICD-10-CM | POA: Diagnosis not present

## 2022-01-01 DIAGNOSIS — R32 Unspecified urinary incontinence: Secondary | ICD-10-CM | POA: Diagnosis not present

## 2022-01-01 DIAGNOSIS — R159 Full incontinence of feces: Secondary | ICD-10-CM | POA: Diagnosis not present

## 2022-01-01 DIAGNOSIS — Z7189 Other specified counseling: Secondary | ICD-10-CM | POA: Diagnosis not present

## 2022-01-01 DIAGNOSIS — E871 Hypo-osmolality and hyponatremia: Secondary | ICD-10-CM | POA: Diagnosis not present

## 2022-01-01 DIAGNOSIS — F32A Depression, unspecified: Secondary | ICD-10-CM | POA: Diagnosis present

## 2022-01-01 DIAGNOSIS — Z833 Family history of diabetes mellitus: Secondary | ICD-10-CM

## 2022-01-01 DIAGNOSIS — S0636AA Traumatic hemorrhage of cerebrum, unspecified, with loss of consciousness status unknown, initial encounter: Secondary | ICD-10-CM | POA: Diagnosis not present

## 2022-01-01 DIAGNOSIS — Z981 Arthrodesis status: Secondary | ICD-10-CM

## 2022-01-01 DIAGNOSIS — S0181XA Laceration without foreign body of other part of head, initial encounter: Secondary | ICD-10-CM | POA: Diagnosis present

## 2022-01-01 DIAGNOSIS — F02818 Dementia in other diseases classified elsewhere, unspecified severity, with other behavioral disturbance: Secondary | ICD-10-CM | POA: Diagnosis not present

## 2022-01-01 DIAGNOSIS — Z23 Encounter for immunization: Secondary | ICD-10-CM

## 2022-01-01 DIAGNOSIS — J9811 Atelectasis: Secondary | ICD-10-CM | POA: Diagnosis not present

## 2022-01-01 DIAGNOSIS — G935 Compression of brain: Secondary | ICD-10-CM | POA: Diagnosis not present

## 2022-01-01 DIAGNOSIS — M4316 Spondylolisthesis, lumbar region: Secondary | ICD-10-CM | POA: Diagnosis present

## 2022-01-01 DIAGNOSIS — S3993XA Unspecified injury of pelvis, initial encounter: Secondary | ICD-10-CM | POA: Diagnosis not present

## 2022-01-01 DIAGNOSIS — I10 Essential (primary) hypertension: Secondary | ICD-10-CM | POA: Diagnosis present

## 2022-01-01 DIAGNOSIS — I959 Hypotension, unspecified: Secondary | ICD-10-CM | POA: Diagnosis not present

## 2022-01-01 DIAGNOSIS — Z9911 Dependence on respirator [ventilator] status: Secondary | ICD-10-CM | POA: Diagnosis not present

## 2022-01-01 DIAGNOSIS — S3991XA Unspecified injury of abdomen, initial encounter: Secondary | ICD-10-CM | POA: Diagnosis not present

## 2022-01-01 DIAGNOSIS — F03911 Unspecified dementia, unspecified severity, with agitation: Secondary | ICD-10-CM | POA: Diagnosis not present

## 2022-01-01 DIAGNOSIS — R69 Illness, unspecified: Secondary | ICD-10-CM | POA: Diagnosis not present

## 2022-01-01 DIAGNOSIS — E8729 Other acidosis: Secondary | ICD-10-CM | POA: Diagnosis not present

## 2022-01-01 DIAGNOSIS — R918 Other nonspecific abnormal finding of lung field: Secondary | ICD-10-CM | POA: Diagnosis not present

## 2022-01-01 DIAGNOSIS — F411 Generalized anxiety disorder: Secondary | ICD-10-CM | POA: Diagnosis present

## 2022-01-01 DIAGNOSIS — J9601 Acute respiratory failure with hypoxia: Secondary | ICD-10-CM | POA: Diagnosis not present

## 2022-01-01 DIAGNOSIS — S22031A Stable burst fracture of third thoracic vertebra, initial encounter for closed fracture: Secondary | ICD-10-CM | POA: Diagnosis present

## 2022-01-01 DIAGNOSIS — Z20822 Contact with and (suspected) exposure to covid-19: Secondary | ICD-10-CM | POA: Diagnosis not present

## 2022-01-01 DIAGNOSIS — S22038A Other fracture of third thoracic vertebra, initial encounter for closed fracture: Secondary | ICD-10-CM | POA: Diagnosis not present

## 2022-01-01 DIAGNOSIS — I517 Cardiomegaly: Secondary | ICD-10-CM | POA: Diagnosis not present

## 2022-01-01 DIAGNOSIS — S22008A Other fracture of unspecified thoracic vertebra, initial encounter for closed fracture: Secondary | ICD-10-CM

## 2022-01-01 DIAGNOSIS — G3183 Dementia with Lewy bodies: Secondary | ICD-10-CM | POA: Diagnosis not present

## 2022-01-01 DIAGNOSIS — E861 Hypovolemia: Secondary | ICD-10-CM | POA: Diagnosis present

## 2022-01-01 DIAGNOSIS — R0902 Hypoxemia: Secondary | ICD-10-CM | POA: Diagnosis not present

## 2022-01-01 DIAGNOSIS — S0511XA Contusion of eyeball and orbital tissues, right eye, initial encounter: Secondary | ICD-10-CM | POA: Diagnosis not present

## 2022-01-01 DIAGNOSIS — G4700 Insomnia, unspecified: Secondary | ICD-10-CM | POA: Diagnosis present

## 2022-01-01 DIAGNOSIS — J690 Pneumonitis due to inhalation of food and vomit: Secondary | ICD-10-CM | POA: Diagnosis not present

## 2022-01-01 DIAGNOSIS — F02C11 Dementia in other diseases classified elsewhere, severe, with agitation: Secondary | ICD-10-CM | POA: Diagnosis not present

## 2022-01-01 DIAGNOSIS — I615 Nontraumatic intracerebral hemorrhage, intraventricular: Secondary | ICD-10-CM | POA: Diagnosis not present

## 2022-01-01 DIAGNOSIS — G9340 Encephalopathy, unspecified: Secondary | ICD-10-CM | POA: Diagnosis not present

## 2022-01-01 DIAGNOSIS — Z7401 Bed confinement status: Secondary | ICD-10-CM

## 2022-01-01 DIAGNOSIS — J9602 Acute respiratory failure with hypercapnia: Secondary | ICD-10-CM | POA: Diagnosis present

## 2022-01-01 DIAGNOSIS — G309 Alzheimer's disease, unspecified: Secondary | ICD-10-CM | POA: Diagnosis not present

## 2022-01-01 DIAGNOSIS — F0284 Dementia in other diseases classified elsewhere, unspecified severity, with anxiety: Secondary | ICD-10-CM | POA: Diagnosis not present

## 2022-01-01 DIAGNOSIS — S06359A Traumatic hemorrhage of left cerebrum with loss of consciousness of unspecified duration, initial encounter: Secondary | ICD-10-CM | POA: Diagnosis not present

## 2022-01-01 DIAGNOSIS — Z8249 Family history of ischemic heart disease and other diseases of the circulatory system: Secondary | ICD-10-CM

## 2022-01-01 DIAGNOSIS — S199XXA Unspecified injury of neck, initial encounter: Secondary | ICD-10-CM | POA: Diagnosis not present

## 2022-01-01 DIAGNOSIS — Z043 Encounter for examination and observation following other accident: Secondary | ICD-10-CM | POA: Diagnosis not present

## 2022-01-01 DIAGNOSIS — Z66 Do not resuscitate: Secondary | ICD-10-CM | POA: Diagnosis not present

## 2022-01-01 DIAGNOSIS — W19XXXA Unspecified fall, initial encounter: Secondary | ICD-10-CM

## 2022-01-01 DIAGNOSIS — S22039A Unspecified fracture of third thoracic vertebra, initial encounter for closed fracture: Secondary | ICD-10-CM | POA: Diagnosis not present

## 2022-01-01 DIAGNOSIS — F0283 Dementia in other diseases classified elsewhere, unspecified severity, with mood disturbance: Secondary | ICD-10-CM | POA: Diagnosis not present

## 2022-01-01 DIAGNOSIS — E860 Dehydration: Secondary | ICD-10-CM | POA: Diagnosis present

## 2022-01-01 DIAGNOSIS — Z515 Encounter for palliative care: Secondary | ICD-10-CM | POA: Diagnosis not present

## 2022-01-01 DIAGNOSIS — F0390 Unspecified dementia without behavioral disturbance: Secondary | ICD-10-CM | POA: Diagnosis present

## 2022-01-01 DIAGNOSIS — E559 Vitamin D deficiency, unspecified: Secondary | ICD-10-CM | POA: Diagnosis present

## 2022-01-01 DIAGNOSIS — R451 Restlessness and agitation: Secondary | ICD-10-CM | POA: Diagnosis not present

## 2022-01-01 DIAGNOSIS — Z823 Family history of stroke: Secondary | ICD-10-CM

## 2022-01-01 DIAGNOSIS — M47812 Spondylosis without myelopathy or radiculopathy, cervical region: Secondary | ICD-10-CM | POA: Diagnosis not present

## 2022-01-01 DIAGNOSIS — S0003XA Contusion of scalp, initial encounter: Secondary | ICD-10-CM | POA: Diagnosis not present

## 2022-01-01 DIAGNOSIS — R4182 Altered mental status, unspecified: Secondary | ICD-10-CM | POA: Diagnosis not present

## 2022-01-01 DIAGNOSIS — F0781 Postconcussional syndrome: Secondary | ICD-10-CM | POA: Diagnosis not present

## 2022-01-01 DIAGNOSIS — J984 Other disorders of lung: Secondary | ICD-10-CM | POA: Diagnosis not present

## 2022-01-01 DIAGNOSIS — Z4682 Encounter for fitting and adjustment of non-vascular catheter: Secondary | ICD-10-CM | POA: Diagnosis not present

## 2022-01-01 DIAGNOSIS — R1310 Dysphagia, unspecified: Secondary | ICD-10-CM | POA: Diagnosis present

## 2022-01-01 DIAGNOSIS — R410 Disorientation, unspecified: Secondary | ICD-10-CM | POA: Diagnosis not present

## 2022-01-01 DIAGNOSIS — Z803 Family history of malignant neoplasm of breast: Secondary | ICD-10-CM

## 2022-01-01 LAB — CBC
HCT: 45.4 % (ref 36.0–46.0)
Hemoglobin: 14.8 g/dL (ref 12.0–15.0)
MCH: 31.6 pg (ref 26.0–34.0)
MCHC: 32.6 g/dL (ref 30.0–36.0)
MCV: 96.8 fL (ref 80.0–100.0)
Platelets: 336 10*3/uL (ref 150–400)
RBC: 4.69 MIL/uL (ref 3.87–5.11)
RDW: 13.8 % (ref 11.5–15.5)
WBC: 14.9 10*3/uL — ABNORMAL HIGH (ref 4.0–10.5)
nRBC: 0 % (ref 0.0–0.2)

## 2022-01-01 LAB — I-STAT ARTERIAL BLOOD GAS, ED
Acid-Base Excess: 1 mmol/L (ref 0.0–2.0)
Bicarbonate: 28.7 mmol/L — ABNORMAL HIGH (ref 20.0–28.0)
Calcium, Ion: 1.2 mmol/L (ref 1.15–1.40)
HCT: 37 % (ref 36.0–46.0)
Hemoglobin: 12.6 g/dL (ref 12.0–15.0)
O2 Saturation: 100 %
Patient temperature: 97.8
Potassium: 4.5 mmol/L (ref 3.5–5.1)
Sodium: 132 mmol/L — ABNORMAL LOW (ref 135–145)
TCO2: 31 mmol/L (ref 22–32)
pCO2 arterial: 59.9 mmHg — ABNORMAL HIGH (ref 32–48)
pH, Arterial: 7.286 — ABNORMAL LOW (ref 7.35–7.45)
pO2, Arterial: 277 mmHg — ABNORMAL HIGH (ref 83–108)

## 2022-01-01 LAB — URINALYSIS, ROUTINE W REFLEX MICROSCOPIC
Bacteria, UA: NONE SEEN
Bilirubin Urine: NEGATIVE
Glucose, UA: NEGATIVE mg/dL
Hgb urine dipstick: NEGATIVE
Ketones, ur: NEGATIVE mg/dL
Nitrite: POSITIVE — AB
Protein, ur: NEGATIVE mg/dL
Specific Gravity, Urine: 1.013 (ref 1.005–1.030)
pH: 6 (ref 5.0–8.0)

## 2022-01-01 LAB — SAMPLE TO BLOOD BANK

## 2022-01-01 LAB — COMPREHENSIVE METABOLIC PANEL
ALT: 26 U/L (ref 0–44)
AST: 40 U/L (ref 15–41)
Albumin: 4.1 g/dL (ref 3.5–5.0)
Alkaline Phosphatase: 150 U/L — ABNORMAL HIGH (ref 38–126)
Anion gap: 9 (ref 5–15)
BUN: 7 mg/dL — ABNORMAL LOW (ref 8–23)
CO2: 27 mmol/L (ref 22–32)
Calcium: 9.1 mg/dL (ref 8.9–10.3)
Chloride: 96 mmol/L — ABNORMAL LOW (ref 98–111)
Creatinine, Ser: 1.17 mg/dL — ABNORMAL HIGH (ref 0.44–1.00)
GFR, Estimated: 50 mL/min — ABNORMAL LOW (ref >60)
Glucose, Bld: 129 mg/dL — ABNORMAL HIGH (ref 70–99)
Potassium: 4.6 mmol/L (ref 3.5–5.1)
Sodium: 132 mmol/L — ABNORMAL LOW (ref 135–145)
Total Bilirubin: 0.7 mg/dL (ref 0.3–1.2)
Total Protein: 7.4 g/dL (ref 6.5–8.1)

## 2022-01-01 LAB — I-STAT VENOUS BLOOD GAS, ED
Acid-base deficit: 2 mmol/L (ref 0.0–2.0)
Bicarbonate: 28.3 mmol/L — ABNORMAL HIGH (ref 20.0–28.0)
Calcium, Ion: 1.2 mmol/L (ref 1.15–1.40)
HCT: 46 % (ref 36.0–46.0)
Hemoglobin: 15.6 g/dL — ABNORMAL HIGH (ref 12.0–15.0)
O2 Saturation: 69 %
Potassium: 4.6 mmol/L (ref 3.5–5.1)
Sodium: 133 mmol/L — ABNORMAL LOW (ref 135–145)
TCO2: 30 mmol/L (ref 22–32)
pCO2, Ven: 73.3 mmHg (ref 44–60)
pH, Ven: 7.194 — CL (ref 7.25–7.43)
pO2, Ven: 45 mmHg (ref 32–45)

## 2022-01-01 LAB — RESP PANEL BY RT-PCR (FLU A&B, COVID) ARPGX2
Influenza A by PCR: NEGATIVE
Influenza B by PCR: NEGATIVE
SARS Coronavirus 2 by RT PCR: NEGATIVE

## 2022-01-01 LAB — TYPE AND SCREEN
ABO/RH(D): A POS
Antibody Screen: NEGATIVE

## 2022-01-01 LAB — PROTIME-INR
INR: 1 (ref 0.8–1.2)
Prothrombin Time: 13.5 seconds (ref 11.4–15.2)

## 2022-01-01 MED ORDER — SODIUM CHLORIDE 0.9 % IV SOLN
500.0000 mg | Freq: Once | INTRAVENOUS | Status: AC
  Administered 2022-01-02: 500 mg via INTRAVENOUS
  Filled 2022-01-01 (×3): qty 5

## 2022-01-01 MED ORDER — ETOMIDATE 2 MG/ML IV SOLN
INTRAVENOUS | Status: AC
  Filled 2022-01-01: qty 20

## 2022-01-01 MED ORDER — FENTANYL CITRATE PF 50 MCG/ML IJ SOSY
PREFILLED_SYRINGE | INTRAMUSCULAR | Status: AC
  Filled 2022-01-01: qty 1

## 2022-01-01 MED ORDER — FENTANYL BOLUS VIA INFUSION
25.0000 ug | INTRAVENOUS | Status: DC | PRN
  Administered 2022-01-02: 50 ug via INTRAVENOUS

## 2022-01-01 MED ORDER — SODIUM CHLORIDE 0.9 % IV SOLN
1.0000 g | Freq: Once | INTRAVENOUS | Status: AC
  Administered 2022-01-01: 1 g via INTRAVENOUS
  Filled 2022-01-01: qty 10

## 2022-01-01 MED ORDER — ETOMIDATE 2 MG/ML IV SOLN
INTRAVENOUS | Status: AC | PRN
  Administered 2022-01-01: 20 mg via INTRAVENOUS

## 2022-01-01 MED ORDER — ROCURONIUM BROMIDE 50 MG/5ML IV SOLN
INTRAVENOUS | Status: AC | PRN
  Administered 2022-01-01: 80 mg via INTRAVENOUS

## 2022-01-01 MED ORDER — FENTANYL CITRATE PF 50 MCG/ML IJ SOSY
PREFILLED_SYRINGE | INTRAMUSCULAR | Status: AC
  Filled 2022-01-01: qty 2

## 2022-01-01 MED ORDER — KETAMINE HCL 50 MG/5ML IJ SOSY
PREFILLED_SYRINGE | INTRAMUSCULAR | Status: AC
  Filled 2022-01-01: qty 5

## 2022-01-01 MED ORDER — FENTANYL 2500MCG IN NS 250ML (10MCG/ML) PREMIX INFUSION
25.0000 ug/h | INTRAVENOUS | Status: DC
  Administered 2022-01-01: 75 ug/h via INTRAVENOUS
  Administered 2022-01-02: 25 ug/h via INTRAVENOUS
  Filled 2022-01-01 (×2): qty 250

## 2022-01-01 MED ORDER — DOCUSATE SODIUM 100 MG PO CAPS: 100.0000 mg | ORAL_CAPSULE | Freq: Two times a day (BID) | ORAL | Status: DC | PRN

## 2022-01-01 MED ORDER — PROPOFOL 1000 MG/100ML IV EMUL
0.0000 ug/kg/min | INTRAVENOUS | Status: DC
  Administered 2022-01-01: 20 ug/kg/min via INTRAVENOUS
  Administered 2022-01-02: 30 ug/kg/min via INTRAVENOUS
  Filled 2022-01-01: qty 100

## 2022-01-01 MED ORDER — PROPOFOL 1000 MG/100ML IV EMUL
INTRAVENOUS | Status: AC
  Filled 2022-01-01: qty 100

## 2022-01-01 MED ORDER — LACTATED RINGERS IV BOLUS
1000.0000 mL | Freq: Once | INTRAVENOUS | Status: AC
  Administered 2022-01-01: 1000 mL via INTRAVENOUS

## 2022-01-01 MED ORDER — DOCUSATE SODIUM 50 MG/5ML PO LIQD
100.0000 mg | Freq: Two times a day (BID) | ORAL | Status: DC
  Administered 2022-01-02 (×3): 100 mg
  Filled 2022-01-01 (×4): qty 10

## 2022-01-01 MED ORDER — IOHEXOL 300 MG/ML  SOLN
100.0000 mL | Freq: Once | INTRAMUSCULAR | Status: AC | PRN
  Administered 2022-01-01: 100 mL via INTRAVENOUS

## 2022-01-01 MED ORDER — POLYETHYLENE GLYCOL 3350 17 G PO PACK
17.0000 g | PACK | Freq: Every day | ORAL | Status: DC
  Administered 2022-01-02: 17 g
  Filled 2022-01-01: qty 1

## 2022-01-01 MED ORDER — PANTOPRAZOLE 2 MG/ML SUSPENSION
40.0000 mg | Freq: Every day | ORAL | Status: DC
  Administered 2022-01-02: 40 mg
  Filled 2022-01-01 (×2): qty 20

## 2022-01-01 MED ORDER — TETANUS-DIPHTH-ACELL PERTUSSIS 5-2.5-18.5 LF-MCG/0.5 IM SUSY
0.5000 mL | PREFILLED_SYRINGE | Freq: Once | INTRAMUSCULAR | Status: AC
  Administered 2022-01-01: 0.5 mL via INTRAMUSCULAR
  Filled 2022-01-01: qty 0.5

## 2022-01-01 MED ORDER — ROCURONIUM BROMIDE 10 MG/ML (PF) SYRINGE
PREFILLED_SYRINGE | INTRAVENOUS | Status: AC
  Filled 2022-01-01: qty 10

## 2022-01-01 MED ORDER — FENTANYL CITRATE PF 50 MCG/ML IJ SOSY: 50.0000 ug | PREFILLED_SYRINGE | Freq: Once | INTRAMUSCULAR | Status: DC

## 2022-01-01 MED ORDER — POLYETHYLENE GLYCOL 3350 17 G PO PACK: 17.0000 g | PACK | Freq: Every day | ORAL | Status: DC | PRN

## 2022-01-01 MED ORDER — SUCCINYLCHOLINE CHLORIDE 200 MG/10ML IV SOSY
PREFILLED_SYRINGE | INTRAVENOUS | Status: AC
  Filled 2022-01-01: qty 10

## 2022-01-01 MED ORDER — MIDAZOLAM HCL 2 MG/2ML IJ SOLN
INTRAMUSCULAR | Status: AC
  Filled 2022-01-01: qty 2

## 2022-01-01 MED ORDER — CEFAZOLIN SODIUM-DEXTROSE 2-4 GM/100ML-% IV SOLN
2.0000 g | Freq: Once | INTRAVENOUS | Status: AC
  Administered 2022-01-01: 2 g via INTRAVENOUS
  Filled 2022-01-01: qty 100

## 2022-01-01 NOTE — H&P (Addendum)
TRAUMA H&P  01/01/2022, 9:22 PM   Chief Complaint: Level 1 trauma activation for altered mental status necessitating intubation  [name].   HPI: At the time of my arrival, patient was undergoing intubation by EDP. All history is obtained from the EDP. [name] who was reportedly behaving abnormally and more sleepy than usual. Patient went into the garage and fell, was found by her husband. [name] where she was still more sleepy than what would be considered normal. Because of this, [name] which notable for acidemia and hypercarbia, prompting intubation. She was reportedly moving all four extremities prior to intubation.   Past Medical History:  Diagnosis Date   Anxiety state, unspecified    Arthritis    Carpal tunnel syndrome    left   Chiari malformation type I (HCC)    Depression    Disorder of bone and cartilage, unspecified    Encounter for long-term (current) use of other medications    Esophageal reflux    Headache    High risk medication use    Insomnia    Memory loss    MMSE 24/30 on 12/05/11, 30/30 on 10/25/13   Memory loss    Menopausal symptoms    Osteopenia    Hx of vitamin D deficiency   Osteopenia    Symptomatic menopausal or female climacteric states    White matter abnormality on MRI of brain     Past Surgical History:  Procedure Laterality Date   APPENDECTOMY     CARPAL TUNNEL RELEASE Left    CATARACT EXTRACTION, BILATERAL     COLONOSCOPY     LUMBAR FUSION  09/09/2018    No pertinent family history.  Social History:  reports that she has never smoked. She has never used smokeless tobacco. She reports that she does not drink alcohol and does not use drugs.     Allergies: No Known Allergies  Medications: reviewed  Results for orders placed or performed during the hospital encounter of 01/01/22 (from the past 48 hour(s))  Comprehensive metabolic panel     Status: Abnormal   Collection Time:  01/01/22  6:50 PM  Result Value Ref Range   Sodium 132 (L) 135 - 145 mmol/L   Potassium 4.6 3.5 - 5.1 mmol/L   Chloride 96 (L) 98 - 111 mmol/L   CO2 27 22 - 32 mmol/L   Glucose, Bld 129 (H) 70 - 99 mg/dL    Comment: Glucose reference range applies only to samples taken after fasting for at least 8 hours.   BUN 7 (L) 8 - 23 mg/dL   Creatinine, Ser 1.17 (H) 0.44 - 1.00 mg/dL   Calcium 9.1 8.9 - 10.3 mg/dL   Total Protein 7.4 6.5 - 8.1 g/dL   Albumin 4.1 3.5 - 5.0 g/dL   AST 40 15 - 41 U/L   ALT 26 0 - 44 U/L   Alkaline Phosphatase 150 (H) 38 - 126 U/L   Total Bilirubin 0.7 0.3 - 1.2 mg/dL   GFR, Estimated 50 (L) >60 mL/min    Comment: (NOTE) Calculated using the CKD-EPI Creatinine Equation (2021)    Anion gap 9 5 - 15    Comment: Performed at [facility] [address], [place],  [address]  CBC     Status: Abnormal   Collection Time: 01/01/22  6:50 PM  Result Value Ref Range   WBC 14.9 (H) 4.0 - 10.5 K/uL  RBC 4.69 3.87 - 5.11 MIL/uL   Hemoglobin 14.8 12.0 - 15.0 g/dL   HCT 45.4 36.0 - 46.0 %   MCV 96.8 80.0 - 100.0 fL   MCH 31.6 26.0 - 34.0 pg   MCHC 32.6 30.0 - 36.0 g/dL   RDW 13.8 11.5 - 15.5 %   Platelets 336 150 - 400 K/uL   nRBC 0.0 0.0 - 0.2 %    Comment: Performed at [facility], [place] [address], [place], [place] [address]  Protime-INR     Status: None   Collection Time: 01/01/22  6:50 PM  Result Value Ref Range   Prothrombin Time 13.5 11.4 - 15.2 seconds   INR 1.0 0.8 - 1.2    Comment: (NOTE) INR goal varies based on device and disease states. Performed at [facility], [place] [address] [place], [place] [address]   Sample to Blood Bank     Status: None   Collection Time: 01/01/22  6:50 PM  Result Value Ref Range   Blood Bank Specimen SAMPLE AVAILABLE FOR TESTING    Sample Expiration      01/02/2022,2359 Performed at [facility], [place] [address] [place], [place] [address]   I-Stat venous blood gas, [facility] ED only)     Status:  Abnormal   Collection Time: 01/01/22  6:57 PM  Result Value Ref Range   pH, Ven 7.194 (LL) 7.25 - 7.43   pCO2, Ven 73.3 (HH) 44 - 60 mmHg   pO2, Ven 45 32 - 45 mmHg   Bicarbonate 28.3 (H) 20.0 - 28.0 mmol/L   TCO2 30 22 - 32 mmol/L   O2 Saturation 69 %   Acid-base deficit 2.0 0.0 - 2.0 mmol/L   Sodium 133 (L) 135 - 145 mmol/L   Potassium 4.6 3.5 - 5.1 mmol/L   Calcium, Ion 1.20 1.15 - 1.40 mmol/L   HCT 46.0 36.0 - 46.0 %   Hemoglobin 15.6 (H) 12.0 - 15.0 g/dL   Sample type VENOUS    Comment NOTIFIED PHYSICIAN   I-Stat arterial blood gas, ED     Status: Abnormal   Collection Time: 01/01/22  8:37 PM  Result Value Ref Range   pH, Arterial 7.286 (L) 7.35 - 7.45   pCO2 arterial 59.9 (H) 32 - 48 mmHg   pO2, Arterial 277 (H) 83 - 108 mmHg   Bicarbonate 28.7 (H) 20.0 - 28.0 mmol/L   TCO2 31 22 - 32 mmol/L   O2 Saturation 100 %   Acid-Base Excess 1.0 0.0 - 2.0 mmol/L   Sodium 132 (L) 135 - 145 mmol/L   Potassium 4.5 3.5 - 5.1 mmol/L   Calcium, Ion 1.20 1.15 - 1.40 mmol/L   HCT 37.0 36.0 - 46.0 %   Hemoglobin 12.6 12.0 - 15.0 g/dL   Patient temperature 97.8 F    Collection site RADIAL, ALLEN'S TEST ACCEPTABLE    Drawn by Operator    Sample type ARTERIAL   Type and screen Dunn     Status: None (Preliminary result)   Collection Time: 01/01/22  8:55 PM  Result Value Ref Range   ABO/RH(D) PENDING    Antibody Screen PENDING    Sample Expiration      01/04/2022,2359 Performed at [facility], [place] [address] [place], [place] [address]     CT HEAD WO CONTRAST  Result Date: 01/01/2022 CLINICAL DATA:  Trauma. EXAM: CT HEAD WITHOUT CONTRAST CT MAXILLOFACIAL WITHOUT CONTRAST CT CERVICAL SPINE  WITHOUT CONTRAST TECHNIQUE: Multidetector CT imaging of the head, cervical spine, and maxillofacial structures were performed using the standard protocol without intravenous contrast. Multiplanar CT image reconstructions of the cervical spine and maxillofacial structures  were also generated. RADIATION DOSE REDUCTION: This exam was performed according to the departmental dose-optimization program which includes automated exposure control, adjustment of the mA and/or kV according to patient size and/or use of iterative reconstruction technique. COMPARISON:  Head CT dated 11/13/2019. FINDINGS: CT HEAD FINDINGS Brain: The ventricles and sulci appropriate size for patient's age. Mild periventricular and deep white matter chronic microvascular ischemic changes noted. There is a 7 mm high attenuating focus along the posterior septum pellucidum most consistent with an acute intraventricular hemorrhage (18/3). No mass effect or midline shift. No extra-axial fluid collection. Vascular: No hyperdense vessel or unexpected calcification. Skull: Normal. Negative for fracture or focal lesion. Other: Laceration of the skin over the right lateral forehead. No large hematoma. CT MAXILLOFACIAL FINDINGS Osseous: No acute fracture.  No mandibular subluxation. Orbits: The globes and retro-orbital fat are preserved. Sinuses: Clear. Soft tissues: Mild right periorbital contusion.  No large hematoma. CT CERVICAL SPINE FINDINGS Alignment: No acute subluxation. Skull base and vertebrae: No acute cervical spine fracture. Chronic fragmentation of the tip of the odontoid process. Nondisplaced fracture of the spinous process of T3. Soft tissues and spinal canal: No prevertebral fluid or swelling. No visible canal hematoma. Disc levels: No acute findings. Degenerative changes primarily at C4-C5 and C5-C6. Upper chest: Bilateral pulmonary streaky densities. Other: An endotracheal and an enteric tube are partially visualized. IMPRESSION: 1. Small acute intraventricular hemorrhage. 2. No acute facial bone fractures. 3. No acute cervical spine fracture or subluxation. Nondisplaced fracture of the spinous process of T3. These results were discussed in person with Dr. Bobbye Morton at the time of imaging on 01/01/2022 at 8:59  pm. Electronically Signed   By: Anner Crete M.D.   On: 01/01/2022 21:01   CT MAXILLOFACIAL WO CONTRAST  Result Date: 01/01/2022 CLINICAL DATA:  Trauma. EXAM: CT HEAD WITHOUT CONTRAST CT MAXILLOFACIAL WITHOUT CONTRAST CT CERVICAL SPINE WITHOUT CONTRAST TECHNIQUE: Multidetector CT imaging of the head, cervical spine, and maxillofacial structures were performed using the standard protocol without intravenous contrast. Multiplanar CT image reconstructions of the cervical spine and maxillofacial structures were also generated. RADIATION DOSE REDUCTION: This exam was performed according to the departmental dose-optimization program which includes automated exposure control, adjustment of the mA and/or kV according to patient size and/or use of iterative reconstruction technique. COMPARISON:  Head CT dated 11/13/2019. FINDINGS: CT HEAD FINDINGS Brain: The ventricles and sulci appropriate size for patient's age. Mild periventricular and deep white matter chronic microvascular ischemic changes noted. There is a 7 mm high attenuating focus along the posterior septum pellucidum most consistent with an acute intraventricular hemorrhage (18/3). No mass effect or midline shift. No extra-axial fluid collection. Vascular: No hyperdense vessel or unexpected calcification. Skull: Normal. Negative for fracture or focal lesion. Other: Laceration of the skin over the right lateral forehead. No large hematoma. CT MAXILLOFACIAL FINDINGS Osseous: No acute fracture.  No mandibular subluxation. Orbits: The globes and retro-orbital fat are preserved. Sinuses: Clear. Soft tissues: Mild right periorbital contusion.  No large hematoma. CT CERVICAL SPINE FINDINGS Alignment: No acute subluxation. Skull base and vertebrae: No acute cervical spine fracture. Chronic fragmentation of the tip of the odontoid process. Nondisplaced fracture of the spinous process of T3. Soft tissues and spinal canal: No prevertebral fluid or swelling. No visible  canal hematoma. Disc levels:  No acute findings. Degenerative changes primarily at C4-C5 and C5-C6. Upper chest: Bilateral pulmonary streaky densities. Other: An endotracheal and an enteric tube are partially visualized. IMPRESSION: 1. Small acute intraventricular hemorrhage. 2. No acute facial bone fractures. 3. No acute cervical spine fracture or subluxation. Nondisplaced fracture of the spinous process of T3. These results were discussed in person with Dr. Bobbye Morton at the time of imaging on 01/01/2022 at 8:59 pm. Electronically Signed   By: Anner Crete M.D.   On: 01/01/2022 21:01   CT CERVICAL SPINE WO CONTRAST  Result Date: 01/01/2022 CLINICAL DATA:  Trauma. EXAM: CT HEAD WITHOUT CONTRAST CT MAXILLOFACIAL WITHOUT CONTRAST CT CERVICAL SPINE WITHOUT CONTRAST TECHNIQUE: Multidetector CT imaging of the head, cervical spine, and maxillofacial structures were performed using the standard protocol without intravenous contrast. Multiplanar CT image reconstructions of the cervical spine and maxillofacial structures were also generated. RADIATION DOSE REDUCTION: This exam was performed according to the departmental dose-optimization program which includes automated exposure control, adjustment of the mA and/or kV according to patient size and/or use of iterative reconstruction technique. COMPARISON:  Head CT dated 11/13/2019. FINDINGS: CT HEAD FINDINGS Brain: The ventricles and sulci appropriate size for patient's age. Mild periventricular and deep white matter chronic microvascular ischemic changes noted. There is a 7 mm high attenuating focus along the posterior septum pellucidum most consistent with an acute intraventricular hemorrhage (18/3). No mass effect or midline shift. No extra-axial fluid collection. Vascular: No hyperdense vessel or unexpected calcification. Skull: Normal. Negative for fracture or focal lesion. Other: Laceration of the skin over the right lateral forehead. No large hematoma. CT  MAXILLOFACIAL FINDINGS Osseous: No acute fracture.  No mandibular subluxation. Orbits: The globes and retro-orbital fat are preserved. Sinuses: Clear. Soft tissues: Mild right periorbital contusion.  No large hematoma. CT CERVICAL SPINE FINDINGS Alignment: No acute subluxation. Skull base and vertebrae: No acute cervical spine fracture. Chronic fragmentation of the tip of the odontoid process. Nondisplaced fracture of the spinous process of T3. Soft tissues and spinal canal: No prevertebral fluid or swelling. No visible canal hematoma. Disc levels: No acute findings. Degenerative changes primarily at C4-C5 and C5-C6. Upper chest: Bilateral pulmonary streaky densities. Other: An endotracheal and an enteric tube are partially visualized. IMPRESSION: 1. Small acute intraventricular hemorrhage. 2. No acute facial bone fractures. 3. No acute cervical spine fracture or subluxation. Nondisplaced fracture of the spinous process of T3. These results were discussed in person with Dr. Bobbye Morton at the time of imaging on 01/01/2022 at 8:59 pm. Electronically Signed   By: Anner Crete M.D.   On: 01/01/2022 21:01   CT CHEST ABDOMEN PELVIS W CONTRAST  Result Date: 01/01/2022 CLINICAL DATA:  Trauma. EXAM: CT CHEST, ABDOMEN, AND PELVIS WITH CONTRAST TECHNIQUE: Multidetector CT imaging of the chest, abdomen and pelvis was performed following the standard protocol during bolus administration of intravenous contrast. RADIATION DOSE REDUCTION: This exam was performed according to the departmental dose-optimization program which includes automated exposure control, adjustment of the mA and/or kV according to patient size and/or use of iterative reconstruction technique. CONTRAST:  157m OMNIPAQUE IOHEXOL 300 MG/ML  SOLN COMPARISON:  Chest radiograph dated 01/01/2022. FINDINGS: CT CHEST FINDINGS Cardiovascular: There is no cardiomegaly or pericardial effusion. There is calcification of the mitral annulus. Mild atherosclerotic  calcification of the thoracic aorta. No aneurysmal dilatation or dissection. The origins of the great vessels of the aortic arch and the central pulmonary arteries appear patent as visualized. Mediastinum/Nodes: No hilar or mediastinal adenopathy. An enteric tube is  noted within the esophagus. No mediastinal fluid collection. Lungs/Pleura: Bilateral lower lobe and posterior subpleural patchy subsegmental and streaky densities may represent atelectasis. Pulmonary contusions or pneumonia is not excluded clinical correlation is recommended. There is no pleural effusion or pneumothorax. The central airways are patent. Endotracheal tube with tip approximately 2 cm above the carina. Musculoskeletal: Old healed bilateral posterior rib fractures. Nondisplaced fracture of the T3 spinous process. CT ABDOMEN PELVIS FINDINGS No intra-abdominal free air or free fluid. Hepatobiliary: Probable mild fatty liver. There is a 2.2 cm cyst in the left lobe of the liver. Additional subcentimeter hypodense lesions are too small to characterize. There is no intrahepatic biliary dilatation. The gallbladder is unremarkable. Pancreas: Unremarkable. No pancreatic ductal dilatation or surrounding inflammatory changes. Spleen: Normal in size without focal abnormality. Adrenals/Urinary Tract: The adrenal glands unremarkable. The kidneys, visualized ureters, and urinary bladder appear unremarkable. Stomach/Bowel: Enteric tube with tip in the distal stomach. There is sigmoid diverticulosis without active inflammatory changes. No bowel obstruction or active inflammation. Appendectomy. Vascular/Lymphatic: Mild aortoiliac atherosclerotic disease. The IVC is unremarkable. No portal venous gas. There is no adenopathy. Reproductive: The uterus and ovaries are grossly unremarkable. No pelvic mass. Other: None Musculoskeletal: L3-L4 posterior fusion. Degenerative changes of the lower lumbar spine. No acute osseous pathology. IMPRESSION: 1. Nondisplaced  fracture of the T3 spinous process. 2. Bilateral lower lobe and posterior subpleural patchy subsegmental and streaky densities may represent atelectasis. Pulmonary contusions or pneumonia is not excluded clinical correlation is recommended. 3. No acute intra-abdominal or pelvic pathology. 4. Aortic Atherosclerosis (ICD10-I70.0). These results were discussed in person with Dr. Bobbye Morton at the time of imaging on 01/01/2022 at 8:59 pm. Electronically Signed   By: Anner Crete M.D.   On: 01/01/2022 21:00   DG Chest Portable 1 View  Result Date: 01/01/2022 CLINICAL DATA:  Intubation. EXAM: PORTABLE CHEST 1 VIEW COMPARISON:  Chest x-ray 01/01/2022 FINDINGS: Endotracheal tube tip is 3 cm above the carina. Enteric tube tip extends below the diaphragm into the stomach. The cardiomediastinal silhouette is within normal limits. There are minimal patchy opacities in the left lung base. There is no pleural effusion or pneumothorax. There are healed left-sided rib fractures. IMPRESSION: 1. Minimal left basilar atelectasis/airspace disease. 2. Endotracheal tube tip is 3 cm above the carina. Electronically Signed   By: Ronney Asters M.D.   On: 01/01/2022 20:57   DG Chest Portable 1 View  Result Date: 01/01/2022 CLINICAL DATA:  Unwitnessed fall EXAM: PORTABLE CHEST 1 VIEW COMPARISON:  11/15/2019 FINDINGS: No consolidation, pleural effusion or pneumothorax. Lungs appear hyperinflated. Borderline cardiomegaly. Mild central airways thickening without focal airspace disease. IMPRESSION: 1. Negative for pneumothorax 2. Borderline cardiomegaly 3. Mild central airways thickening without focal airspace disease Electronically Signed   By: Donavan Foil M.D.   On: 01/01/2022 19:06   DG Pelvis Portable  Result Date: 01/01/2022 CLINICAL DATA:  Unwitnessed fall. EXAM: PORTABLE PELVIS 1-2 VIEWS COMPARISON:  None Available. FINDINGS: There is no evidence of pelvic fracture or diastasis. There are degenerative changes of the pubic  symphysis. No pelvic bone lesions are seen. Lower lumbar fusion hardware is partially visualized. IMPRESSION: Negative. Electronically Signed   By: Ronney Asters M.D.   On: 01/01/2022 18:38    ROS 10 point review of systems is negative except as listed above in HPI.  Blood pressure (!) 140/52, pulse 67, temperature 97.6 F (36.4 C), temperature source Oral, resp. rate (!) 25, SpO2 100 %.  Secondary Survey:  GCS: E(1)//V(1)//M(1) (my exam s/p chemical  paralysis) Constitutional: well-developed, well-nourished Skull: normocephalic, atraumatic, laceration to R temple Eyes: pupils equal, round, reactive to light, 68m b/l, moist conjunctiva Face/ENT: midface stable without deformity, poor  dentition, external inspection of ears and nose normal, hearing unable to be assessed  Oropharynx: normal oropharyngeal mucosa, no blood, intubated in ED Neck: no thyromegaly, trachea midline, c-collar applied in TB, unable to assess midline cervical tenderness to palpation, no C-spine stepoffs Chest: breath sounds equal bilaterally, no  respiratory effort,  no  midline or lateral chest wall deformity Abdomen: soft, NT, no bruising, no hepatosplenomegaly FAST: not performed Pelvis: stable GU: normal female genitalia Back: no wounds, unable to assess T/L spine TTP, no T/L spine stepoffs Rectal: deferred Extremities: 2+  radial and pedal pulses bilaterally, unable to assess motor and sensation of bilateral UE and LE, no peripheral edema MSK: unable to assess gait/station, no clubbing/cyanosis of fingers/toes, unable to assess ROM of all four extremities Skin: warm, dry, no rashes  Procedures in TB: intubation by EDP, two attempts, small glottic opening, size 7 ETT, glidescope used, grade 1 view    Assessment/Plan: Problem List Fall  Plan IVH - recommend NSGY c/s and keppra x7d for sz ppx T3 SP fx - pain control, okay to remove cervical collar Altered mental status - unclear etiology, but potentially  2/2 hypercarbia Hypercarbic respiratory failure - full support Forehead laceration - to be repaired by EDP FEN - NPO, OGT  DVT - SCDs, hold chemical ppx until cleared by NSGY, when cleared recommend lovenox '30mg'$  BID (trauma dosing) Dispo - Recommend Admit to inpatient--ICU, recommend medicine primary  Critical care time: 41m  AyJesusita OkaMD General and TrSaratogaurgery

## 2022-01-01 NOTE — ED Notes (Signed)
ED Provider at bedside. 

## 2022-01-01 NOTE — ED Notes (Signed)
Patient transported to CT with TRN.  

## 2022-01-01 NOTE — ED Triage Notes (Signed)
BIB EMS from home after an unwitnessed fall. Pt's husband reports that she was found outside on the pavement. There is a large laceration on the right side above the right eye. Pt has dementia and insomnia. EMS reports she takes a sedative at night.

## 2022-01-01 NOTE — Progress Notes (Signed)
Chaplain responded to a Level 1 Trauma page while pt was in CT.  Met with pt's husband and son, establishing a relationship of care and a ministry of presence.  Husband spoke of finding his wife on the ground in the [place].  She had fallen for no apparent reason. Husband expressed his concern for his wife and his believe that God has her because after all, He is the [place] [facility].  Chaplain prayed with husband and son.  Chaplain available if additional support is needed.  [place]

## 2022-01-01 NOTE — Progress Notes (Signed)
Orthopedic Tech Progress Note Patient Details:  CLARIVEL CALLAWAY 04/21/1951 [id number]  Patient ID: Rockney Ghee, female   DOB: [birth year], 71 y.o.   MRN: [id number] I attended trauma page. Karolee Stamps 01/01/2022, 7:53 PM

## 2022-01-01 NOTE — ED Provider Notes (Signed)
[facility] EMERGENCY DEPARTMENT Provider Note   CSN: [id number] Arrival date & time: 01/01/22  1758     History  Chief Complaint  Patient presents with   Fall    71 year old female with a past medical history of Alzheimer's dementia presents to the ED via EMS as an unlevel trauma following an unwitnessed ground-level fall earlier this afternoon.  EMS states that the patient was found on the floor covered in blood by her husband.  EMS states that her mental baseline is typically interactive, though her lucidity waxes and wanes.  She is not currently anticoagulated.  In route, EMS reported that the patient was able to answer yes/no questions and denied any particular areas of pain.  Husband arrived shortly to bedside and stated that the patient was sleepier than usual throughout the day today and not at her mental baseline.  He states that the patient is on home temazepam which is a chronic home medication that she only takes at night.  He states that he administers all of her medications and denies that she would have received any additional doses today.  Denies any known narcotic medications, including tramadol.  The history is provided by the EMS personnel.       Home Medications Prior to Admission medications   Medication Sig Start Date End Date Taking? Authorizing Provider  donepezil (ARICEPT) 23 MG TABS tablet Take 23 mg by mouth every evening.   Yes [provider]  losartan (COZAAR) 50 MG tablet Take 50 mg by mouth daily.   Yes [provider]  memantine (NAMENDA) 10 MG tablet Take 10 mg by mouth 2 (two) times daily.   Yes [provider]  venlafaxine XR (EFFEXOR-XR) 150 MG 24 hr capsule Take 150 mg by mouth 2 (two) times daily.   Yes [provider]  celecoxib (CELEBREX) 200 MG capsule Take 1 capsule (200 mg total) by mouth daily. 02/03/19   Jessy Oto, MD  cephALEXin (KEFLEX) 500 MG capsule Take 1 capsule (500 mg total)  by mouth 4 (four) times daily. 09/24/18   Lanae Crumbly, PA-C  cholecalciferol (VITAMIN D3) 25 MCG (1000 UNIT) tablet Take 1 tablet (1,000 Units total) by mouth daily. 10/21/19   Jessy Oto, MD  DOK 100 MG capsule TAKE ONE CAPSULE BY MOUTH TWICE DAILY 07/07/19   Jessy Oto, MD  gabapentin (NEURONTIN) 300 MG capsule Take 1 capsule (300 mg total) by mouth 2 (two) times daily. 07/07/19   Mcarthur Rossetti, MD  HYDROcodone-acetaminophen (NORCO) 7.5-325 MG tablet Take 1-2 tablets by mouth every 4 (four) hours as needed for moderate pain. 09/12/18   Jessy Oto, MD  loratadine (CLARITIN) 10 MG tablet Take 10 mg by mouth daily.    [provider]  methocarbamol (ROBAXIN) 500 MG tablet Take 1 tablet (500 mg total) by mouth every 8 (eight) hours as needed for muscle spasms. 09/12/18   Jessy Oto, MD  mirtazapine (REMERON) 15 MG tablet Take 15 mg by mouth at bedtime.  05/26/18   [provider]  Misc Natural Products (GLUCOSAMINE CHONDROITIN TRIPLE) TABS Take 1 tablet by mouth daily.    [provider]  Multiple Minerals-Vitamins (CAL MAG ZINC +D3 PO) Take 1 tablet by mouth daily.    [provider]  Multiple Vitamin (MULTIVITAMIN) capsule Take 1 capsule by mouth daily.    [provider]  ondansetron (ZOFRAN) 4 MG tablet TAKE 1 TABLET BY MOUTH EVERY 6 HOURS AS  NEEDED FOR NAUSEA AND VOMITING 07/07/19   Jessy Oto, MD  Potassium 99 MG TABS Take 99 mg by mouth daily.    [provider]  SUPER B COMPLEX/C PO Take 1 tablet by mouth daily.    [provider]  temazepam (RESTORIL) 30 MG capsule Take 30 mg by mouth at bedtime.     [provider]  thiamine (VITAMIN B-1) 100 MG tablet Take 100 mg by mouth daily.    [provider]  traMADol (ULTRAM) 50 MG tablet Take 1 tablet (50 mg total) by mouth every 6 (six) hours as needed for up to 7 days. Patient taking differently: Take 50 mg by mouth every 6 (six) hours as  needed for moderate pain.  08/12/18 09/03/18  Jessy Oto, MD      Allergies    Patient has no known allergies.    Review of Systems   Review of Systems  Unable to perform ROS: Dementia    Physical Exam Updated Vital Signs BP (!) 142/57   Pulse 63   Temp 97.6 F (36.4 C) (Oral)   Resp (!) 25   SpO2 100%  Physical Exam Vitals and nursing note reviewed.  Constitutional:      General: She is in acute distress.     Appearance: She is ill-appearing.  HENT:     Head: Normocephalic.     Comments: 4 cm curvilinear laceration present to the right frontal scalp. Midface stable to palpation Eyes:     Comments: Pupils 16m and sluggishly reactive bilaterally.  Right periorbital ecchymosis and swelling  Cardiovascular:     Rate and Rhythm: Normal rate and regular rhythm.  Pulmonary:     Effort: Bradypnea present.     Breath sounds: Decreased air movement present. Decreased breath sounds and rhonchi present.     Comments: Rhonchorous breath sounds with decreased air movement throughout all lung Vorce. Abdominal:     General: There is distension.     Palpations: Abdomen is soft.  Musculoskeletal:        General: No deformity.     Comments: Pelvis stable to lateral compression, no obvious deformities  Skin:    General: Skin is warm and dry.  Neurological:     GCS: GCS eye subscore is 3. GCS verbal subscore is 3. GCS motor subscore is 4.     Comments: Moving all 4 extremities spontaneously     ED Results / Procedures / Treatments   Labs (all labs ordered are listed, but only abnormal results are displayed) Labs Reviewed  COMPREHENSIVE METABOLIC PANEL - Abnormal; Notable for the following components:      Result Value   Sodium 132 (*)    Chloride 96 (*)    Glucose, Bld 129 (*)    BUN 7 (*)    Creatinine, Ser 1.17 (*)    Alkaline Phosphatase 150 (*)    GFR, Estimated 50 (*)    All other components within normal limits  CBC - Abnormal; Notable for the following components:    WBC 14.9 (*)    All other components within normal limits  I-STAT VENOUS BLOOD GAS, ED - Abnormal; Notable for the following components:   pH, Ven 7.194 (*)    pCO2, Ven 73.3 (*)    Bicarbonate 28.3 (*)    Sodium 133 (*)    Hemoglobin 15.6 (*)    All other components within normal limits  I-STAT ARTERIAL BLOOD GAS, ED - Abnormal; Notable for the following  components:   pH, Arterial 7.286 (*)    pCO2 arterial 59.9 (*)    pO2, Arterial 277 (*)    Bicarbonate 28.7 (*)    Sodium 132 (*)    All other components within normal limits  RESP PANEL BY RT-PCR (FLU A&B, COVID) ARPGX2  CULTURE, BLOOD (ROUTINE X 2)  CULTURE, BLOOD (ROUTINE X 2)  PROTIME-INR  URINALYSIS, ROUTINE W REFLEX MICROSCOPIC  TRIGLYCERIDES  LACTIC ACID, PLASMA  LACTIC ACID, PLASMA  PROCALCITONIN  MAGNESIUM  CBC  BASIC METABOLIC PANEL  MAGNESIUM  PHOSPHORUS  SAMPLE TO BLOOD BANK  TYPE AND SCREEN    EKG None  Radiology CT HEAD WO CONTRAST  Result Date: 01/01/2022 CLINICAL DATA:  Trauma. EXAM: CT HEAD WITHOUT CONTRAST CT MAXILLOFACIAL WITHOUT CONTRAST CT CERVICAL SPINE WITHOUT CONTRAST TECHNIQUE: Multidetector CT imaging of the head, cervical spine, and maxillofacial structures were performed using the standard protocol without intravenous contrast. Multiplanar CT image reconstructions of the cervical spine and maxillofacial structures were also generated. RADIATION DOSE REDUCTION: This exam was performed according to the departmental dose-optimization program which includes automated exposure control, adjustment of the mA and/or kV according to patient size and/or use of iterative reconstruction technique. COMPARISON:  Head CT dated 11/13/2019. FINDINGS: CT HEAD FINDINGS Brain: The ventricles and sulci appropriate size for patient's age. Mild periventricular and deep white matter chronic microvascular ischemic changes noted. There is a 7 mm high attenuating focus along the posterior septum pellucidum most consistent  with an acute intraventricular hemorrhage (18/3). No mass effect or midline shift. No extra-axial fluid collection. Vascular: No hyperdense vessel or unexpected calcification. Skull: Normal. Negative for fracture or focal lesion. Other: Laceration of the skin over the right lateral forehead. No large hematoma. CT MAXILLOFACIAL FINDINGS Osseous: No acute fracture.  No mandibular subluxation. Orbits: The globes and retro-orbital fat are preserved. Sinuses: Clear. Soft tissues: Mild right periorbital contusion.  No large hematoma. CT CERVICAL SPINE FINDINGS Alignment: No acute subluxation. Skull base and vertebrae: No acute cervical spine fracture. Chronic fragmentation of the tip of the odontoid process. Nondisplaced fracture of the spinous process of T3. Soft tissues and spinal canal: No prevertebral fluid or swelling. No visible canal hematoma. Disc levels: No acute findings. Degenerative changes primarily at C4-C5 and C5-C6. Upper chest: Bilateral pulmonary streaky densities. Other: An endotracheal and an enteric tube are partially visualized. IMPRESSION: 1. Small acute intraventricular hemorrhage. 2. No acute facial bone fractures. 3. No acute cervical spine fracture or subluxation. Nondisplaced fracture of the spinous process of T3. These results were discussed in person with Dr. Bobbye Morton at the time of imaging on 01/01/2022 at 8:59 pm. Electronically Signed   By: Anner Crete M.D.   On: 01/01/2022 21:01   CT MAXILLOFACIAL WO CONTRAST  Result Date: 01/01/2022 CLINICAL DATA:  Trauma. EXAM: CT HEAD WITHOUT CONTRAST CT MAXILLOFACIAL WITHOUT CONTRAST CT CERVICAL SPINE WITHOUT CONTRAST TECHNIQUE: Multidetector CT imaging of the head, cervical spine, and maxillofacial structures were performed using the standard protocol without intravenous contrast. Multiplanar CT image reconstructions of the cervical spine and maxillofacial structures were also generated. RADIATION DOSE REDUCTION: This exam was performed  according to the departmental dose-optimization program which includes automated exposure control, adjustment of the mA and/or kV according to patient size and/or use of iterative reconstruction technique. COMPARISON:  Head CT dated 11/13/2019. FINDINGS: CT HEAD FINDINGS Brain: The ventricles and sulci appropriate size for patient's age. Mild periventricular and deep white matter chronic microvascular ischemic changes noted. There is a 7 mm high  attenuating focus along the posterior septum pellucidum most consistent with an acute intraventricular hemorrhage (18/3). No mass effect or midline shift. No extra-axial fluid collection. Vascular: No hyperdense vessel or unexpected calcification. Skull: Normal. Negative for fracture or focal lesion. Other: Laceration of the skin over the right lateral forehead. No large hematoma. CT MAXILLOFACIAL FINDINGS Osseous: No acute fracture.  No mandibular subluxation. Orbits: The globes and retro-orbital fat are preserved. Sinuses: Clear. Soft tissues: Mild right periorbital contusion.  No large hematoma. CT CERVICAL SPINE FINDINGS Alignment: No acute subluxation. Skull base and vertebrae: No acute cervical spine fracture. Chronic fragmentation of the tip of the odontoid process. Nondisplaced fracture of the spinous process of T3. Soft tissues and spinal canal: No prevertebral fluid or swelling. No visible canal hematoma. Disc levels: No acute findings. Degenerative changes primarily at C4-C5 and C5-C6. Upper chest: Bilateral pulmonary streaky densities. Other: An endotracheal and an enteric tube are partially visualized. IMPRESSION: 1. Small acute intraventricular hemorrhage. 2. No acute facial bone fractures. 3. No acute cervical spine fracture or subluxation. Nondisplaced fracture of the spinous process of T3. These results were discussed in person with Dr. Bobbye Morton at the time of imaging on 01/01/2022 at 8:59 pm. Electronically Signed   By: Anner Crete M.D.   On: 01/01/2022  21:01   CT CERVICAL SPINE WO CONTRAST  Result Date: 01/01/2022 CLINICAL DATA:  Trauma. EXAM: CT HEAD WITHOUT CONTRAST CT MAXILLOFACIAL WITHOUT CONTRAST CT CERVICAL SPINE WITHOUT CONTRAST TECHNIQUE: Multidetector CT imaging of the head, cervical spine, and maxillofacial structures were performed using the standard protocol without intravenous contrast. Multiplanar CT image reconstructions of the cervical spine and maxillofacial structures were also generated. RADIATION DOSE REDUCTION: This exam was performed according to the departmental dose-optimization program which includes automated exposure control, adjustment of the mA and/or kV according to patient size and/or use of iterative reconstruction technique. COMPARISON:  Head CT dated 11/13/2019. FINDINGS: CT HEAD FINDINGS Brain: The ventricles and sulci appropriate size for patient's age. Mild periventricular and deep white matter chronic microvascular ischemic changes noted. There is a 7 mm high attenuating focus along the posterior septum pellucidum most consistent with an acute intraventricular hemorrhage (18/3). No mass effect or midline shift. No extra-axial fluid collection. Vascular: No hyperdense vessel or unexpected calcification. Skull: Normal. Negative for fracture or focal lesion. Other: Laceration of the skin over the right lateral forehead. No large hematoma. CT MAXILLOFACIAL FINDINGS Osseous: No acute fracture.  No mandibular subluxation. Orbits: The globes and retro-orbital fat are preserved. Sinuses: Clear. Soft tissues: Mild right periorbital contusion.  No large hematoma. CT CERVICAL SPINE FINDINGS Alignment: No acute subluxation. Skull base and vertebrae: No acute cervical spine fracture. Chronic fragmentation of the tip of the odontoid process. Nondisplaced fracture of the spinous process of T3. Soft tissues and spinal canal: No prevertebral fluid or swelling. No visible canal hematoma. Disc levels: No acute findings. Degenerative changes  primarily at C4-C5 and C5-C6. Upper chest: Bilateral pulmonary streaky densities. Other: An endotracheal and an enteric tube are partially visualized. IMPRESSION: 1. Small acute intraventricular hemorrhage. 2. No acute facial bone fractures. 3. No acute cervical spine fracture or subluxation. Nondisplaced fracture of the spinous process of T3. These results were discussed in person with Dr. Bobbye Morton at the time of imaging on 01/01/2022 at 8:59 pm. Electronically Signed   By: Anner Crete M.D.   On: 01/01/2022 21:01   CT CHEST ABDOMEN PELVIS W CONTRAST  Result Date: 01/01/2022 CLINICAL DATA:  Trauma. EXAM: CT CHEST, ABDOMEN, AND  PELVIS WITH CONTRAST TECHNIQUE: Multidetector CT imaging of the chest, abdomen and pelvis was performed following the standard protocol during bolus administration of intravenous contrast. RADIATION DOSE REDUCTION: This exam was performed according to the departmental dose-optimization program which includes automated exposure control, adjustment of the mA and/or kV according to patient size and/or use of iterative reconstruction technique. CONTRAST:  137m OMNIPAQUE IOHEXOL 300 MG/ML  SOLN COMPARISON:  Chest radiograph dated 01/01/2022. FINDINGS: CT CHEST FINDINGS Cardiovascular: There is no cardiomegaly or pericardial effusion. There is calcification of the mitral annulus. Mild atherosclerotic calcification of the thoracic aorta. No aneurysmal dilatation or dissection. The origins of the great vessels of the aortic arch and the central pulmonary arteries appear patent as visualized. Mediastinum/Nodes: No hilar or mediastinal adenopathy. An enteric tube is noted within the esophagus. No mediastinal fluid collection. Lungs/Pleura: Bilateral lower lobe and posterior subpleural patchy subsegmental and streaky densities may represent atelectasis. Pulmonary contusions or pneumonia is not excluded clinical correlation is recommended. There is no pleural effusion or pneumothorax. The central  airways are patent. Endotracheal tube with tip approximately 2 cm above the carina. Musculoskeletal: Old healed bilateral posterior rib fractures. Nondisplaced fracture of the T3 spinous process. CT ABDOMEN PELVIS FINDINGS No intra-abdominal free air or free fluid. Hepatobiliary: Probable mild fatty liver. There is a 2.2 cm cyst in the left lobe of the liver. Additional subcentimeter hypodense lesions are too small to characterize. There is no intrahepatic biliary dilatation. The gallbladder is unremarkable. Pancreas: Unremarkable. No pancreatic ductal dilatation or surrounding inflammatory changes. Spleen: Normal in size without focal abnormality. Adrenals/Urinary Tract: The adrenal glands unremarkable. The kidneys, visualized ureters, and urinary bladder appear unremarkable. Stomach/Bowel: Enteric tube with tip in the distal stomach. There is sigmoid diverticulosis without active inflammatory changes. No bowel obstruction or active inflammation. Appendectomy. Vascular/Lymphatic: Mild aortoiliac atherosclerotic disease. The IVC is unremarkable. No portal venous gas. There is no adenopathy. Reproductive: The uterus and ovaries are grossly unremarkable. No pelvic mass. Other: None Musculoskeletal: L3-L4 posterior fusion. Degenerative changes of the lower lumbar spine. No acute osseous pathology. IMPRESSION: 1. Nondisplaced fracture of the T3 spinous process. 2. Bilateral lower lobe and posterior subpleural patchy subsegmental and streaky densities may represent atelectasis. Pulmonary contusions or pneumonia is not excluded clinical correlation is recommended. 3. No acute intra-abdominal or pelvic pathology. 4. Aortic Atherosclerosis (ICD10-I70.0). These results were discussed in person with Dr. LBobbye Mortonat the time of imaging on 01/01/2022 at 8:59 pm. Electronically Signed   By: AAnner CreteM.D.   On: 01/01/2022 21:00   DG Chest Portable 1 View  Result Date: 01/01/2022 CLINICAL DATA:  Intubation. EXAM: PORTABLE  CHEST 1 VIEW COMPARISON:  Chest x-ray 01/01/2022 FINDINGS: Endotracheal tube tip is 3 cm above the carina. Enteric tube tip extends below the diaphragm into the stomach. The cardiomediastinal silhouette is within normal limits. There are minimal patchy opacities in the left lung base. There is no pleural effusion or pneumothorax. There are healed left-sided rib fractures. IMPRESSION: 1. Minimal left basilar atelectasis/airspace disease. 2. Endotracheal tube tip is 3 cm above the carina. Electronically Signed   By: ARonney AstersM.D.   On: 01/01/2022 20:57   DG Chest Portable 1 View  Result Date: 01/01/2022 CLINICAL DATA:  Unwitnessed fall EXAM: PORTABLE CHEST 1 VIEW COMPARISON:  11/15/2019 FINDINGS: No consolidation, pleural effusion or pneumothorax. Lungs appear hyperinflated. Borderline cardiomegaly. Mild central airways thickening without focal airspace disease. IMPRESSION: 1. Negative for pneumothorax 2. Borderline cardiomegaly 3. Mild central airways thickening without focal airspace  disease Electronically Signed   By: Donavan Foil M.D.   On: 01/01/2022 19:06   DG Pelvis Portable  Result Date: 01/01/2022 CLINICAL DATA:  Unwitnessed fall. EXAM: PORTABLE PELVIS 1-2 VIEWS COMPARISON:  None Available. FINDINGS: There is no evidence of pelvic fracture or diastasis. There are degenerative changes of the pubic symphysis. No pelvic bone lesions are seen. Lower lumbar fusion hardware is partially visualized. IMPRESSION: Negative. Electronically Signed   By: Ronney Asters M.D.   On: 01/01/2022 18:38    Procedures Procedure Name: Intubation Date/Time: 01/01/2022 8:11 PM  Performed by: Aniah Pauli, [place], MDPre-anesthesia Checklist: Patient identified, Timeout performed, Suction available, Emergency Drugs available and Patient being monitored Oxygen Delivery Method: Non-rebreather mask Preoxygenation: Pre-oxygenation with 100% oxygen Induction Type: Rapid sequence Ventilation: Mask ventilation without  difficulty Laryngoscope Size: Glidescope and 3 Grade View: Grade II Tube size: 7.0 mm Number of attempts: 2 Airway Equipment and Method: Rigid stylet Placement Confirmation: ETT inserted through vocal cords under direct vision Secured at: 24 cm Difficulty Due To: Difficulty was anticipated and Difficult Airway- due to cervical collar Comments: Initially attempted VL with 7.5 tube with difficulty passing through the cords, switched to 7.0 ETT without difficulty    .Marland KitchenLaceration Repair  Date/Time: 01/01/2022 10:28 PM  Performed by: Jaeleigh Monaco, [place], MD Authorized by: Sherwood Gambler, MD   Consent:    Consent obtained:  Verbal   Consent given by:  Spouse Universal protocol:    Patient identity confirmed:  Arm band Laceration details:    Location:  Face   Face location:  Forehead   Length (cm):  4 Treatment:    Area cleansed with:  Saline   Amount of cleaning:  Standard   Irrigation solution:  Sterile saline   Irrigation method:  Syringe Skin repair:    Repair method:  Sutures   Suture size:  3-0   Suture material:  Nylon   Suture technique:  Simple interrupted   Number of sutures:  6 Approximation:    Approximation:  Close Post-procedure details:    Dressing:  Sterile dressing (Xeroform guaze)   Procedure completion:  Tolerated well, no immediate complications     Medications Ordered in ED Medications  fentaNYL (SUBLIMAZE) injection 50 mcg ( Intravenous Not Given 01/01/22 2052)  fentaNYL 2549mg in NS 2554m(1034mml) infusion-PREMIX (75 mcg/hr Intravenous New Bag/Given 01/01/22 2036)  fentaNYL (SUBLIMAZE) bolus via infusion 25-100 mcg (has no administration in time range)  propofol (DIPRIVAN) 1000 MG/100ML infusion (20 mcg/kg/min  80 kg (Order-Specific) Intravenous New Bag/Given 01/01/22 2034)  cefTRIAXone (ROCEPHIN) 1 g in sodium chloride 0.9 % 100 mL IVPB (has no administration in time range)  azithromycin (ZITHROMAX) 500 mg in sodium chloride 0.9 % 250 mL IVPB (has no  administration in time range)  pantoprazole sodium (PROTONIX) 40 mg/20 mL oral suspension 40 mg (has no administration in time range)  docusate (COLACE) 50 MG/5ML liquid 100 mg (has no administration in time range)  polyethylene glycol (MIRALAX / GLYCOLAX) packet 17 g (has no administration in time range)  etomidate (AMIDATE) injection (20 mg Intravenous Given 01/01/22 1941)  rocuronium (ZEMURON) injection (80 mg Intravenous Given 01/01/22 1942)  fentaNYL (SUBLIMAZE) 50 MCG/ML injection (  Given 01/01/22 1946)  ceFAZolin (ANCEF) IVPB 2g/100 mL premix (0 g Intravenous Stopped 01/01/22 2114)  Tdap (BOOSTRIX) injection 0.5 mL (0.5 mLs Intramuscular Given 01/01/22 1956)  iohexol (OMNIPAQUE) 300 MG/ML solution 100 mL (100 mLs Intravenous Contrast Given 01/01/22 2025)  lactated ringers bolus 1,000 mL (0 mLs Intravenous Stopped 01/01/22  2310)    ED Course/ Medical Decision Making/ A&P                           Medical Decision Making Amount and/or Complexity of Data Reviewed Independent Historian: spouse and EMS Labs: ordered. Radiology: ordered and independent interpretation performed.  Risk Prescription drug management. Parenteral controlled substances. Drug therapy requiring intensive monitoring for toxicity. Decision regarding hospitalization.   71 year old female with past medical history as above who presents as an unleveled trauma following an unwitnessed presumed ground-level fall.  On arrival, primary survey assessed and intact, though she was found to be hypoxic in the 70s on room air and was escalated to 2 L nasal cannula.  Secondary exam performed and notable for altered mental status with a GCS of 10.  4 cm scalp laceration is hemostatic and was ultimately repaired as documented above.  Remainder of the exam as documented above.  Initial emergent differential includes intracranial hemorrhage, skull fracture, hemopneumothorax, rib fractures, hollow viscus injury, solid organ injury thus  full trauma scans were obtained.  While awaiting trauma imaging, VBG was obtained notable for severe respiratory acidosis with PCO2 of 73, pH of 7.19.  On reexamination, her mental status appeared to progressively decline she was noted to have intermittent episodes of gasping respirations.  At this point, decision was made to proceed with intubation after discussion with family members at bedside.  Patient was upgraded to a level 1 trauma per protocol.  Patient was successfully intubated as per procedure note above and was started on fentanyl and propofol drips for sedation.  Repeat ABG post intubation with improvement in her hypercapnia and acidosis.  Full trauma imaging was performed which I reviewed.  CT head is notable for a small acute intraventricular hemorrhage without associated mass effect or midline shift.  No other extra-axial fluid collections noted.  There are no acute facial fractures or cervical spine fractures.  She does have a nondisplaced fracture of the spinous process of T3.  CT chest abdomen pelvis without further intrathoracic or intra-abdominal pathology.  She was found to have bilateral lower lobe and subpleural patchy densities which could reflect pulmonary contusions versus pneumonia.    I additionally reviewed and interpreted the patient's labs.  CBC with a slight leukocytosis which is likely sequela of her trauma, though cannot exclude underlying infection given her reported altered mental status prior to her trauma.  CMP with hyponatremia, hypochloremia, and a mild bump in her creatinine, felt to be prerenal in etiology.  No evidence of renal dysfunction.  Normal anion gap.  Spoke with trauma surgery and neurosurgery who will plan to consult on the patient but deferred to medical admission.  Patient did have intermittent episodes of hypotension requiring 2 L of LR.  Given possibility of pneumonia and underlying sepsis, will also initiate empiric antibiotics.  Spoke with critical  care team who will accept the patient to the medical ICU.  Plan of care and results were discussed with family members at bedside verbalized understanding.  Final Clinical Impression(s) / ED Diagnoses Final diagnoses:  IVH (intraventricular hemorrhage) ([place])  Other closed fracture of third thoracic vertebra, initial encounter [facility])  Fall, initial encounter    Rx / DC Orders ED Discharge Orders     None         Christyl Osentoski, [place], MD 01/01/22 2312    Sherwood Gambler, MD 01/01/22 2319

## 2022-01-01 NOTE — Consult Note (Signed)
Reason for Consult:IVH and T3 fx Referring Physician: EDP   Carly Larson is an 71 y.o. female.   HPI:  71 year old female presented to the ED tonight after sustaining a fall at home. Her blood gas was abnormal and patient was intubated. Apparently she was acting off at home according to her husband. Patient was sedated and intubated during our exam.   Past Medical History:  Diagnosis Date   Anxiety state, unspecified    Arthritis    Carpal tunnel syndrome    left   Chiari malformation type I (HCC)    Depression    Disorder of bone and cartilage, unspecified    Encounter for long-term (current) use of other medications    Esophageal reflux    Headache    High risk medication use    Insomnia    Memory loss    MMSE 24/30 on 12/05/11, 30/30 on 10/25/13   Memory loss    Menopausal symptoms    Osteopenia    Hx of vitamin D deficiency   Osteopenia    Symptomatic menopausal or female climacteric states    White matter abnormality on MRI of brain     Past Surgical History:  Procedure Laterality Date   APPENDECTOMY     CARPAL TUNNEL RELEASE Left    CATARACT EXTRACTION, BILATERAL     COLONOSCOPY     LUMBAR FUSION  09/09/2018    No Known Allergies  Social History   Tobacco Use   Smoking status: Never   Smokeless tobacco: Never  Substance Use Topics   Alcohol use: No    Alcohol/week: 0.0 standard drinks of alcohol    Family History  Problem Relation Age of Onset   Diabetes Mother    Osteoarthritis Mother    Hypertension Mother    Dementia Mother    Stroke Father    Heart attack Brother    CAD Brother    Breast cancer Maternal Aunt      Review of Systems  Positive ROS: intubated and sedated  All other systems have been reviewed and were otherwise negative with the exception of those mentioned in the HPI and as above.  Objective: Vital signs in last 24 hours: Temp:  [97.6 F (36.4 C)] 97.6 F (36.4 C) (06/12 2116) Pulse Rate:  [65-131] 65 (06/12 2130) Resp:   [12-33] 28 (06/12 2130) BP: (97-186)/(41-78) 97/41 (06/12 2130) SpO2:  [98 %-100 %] 100 % (06/12 2130) FiO2 (%):  [60 %] 60 % (06/12 1945)  General Appearance: intubated and sedated Head: Normocephalic, without obvious abnormality, atraumatic Eyes: PERRL, conjunctiva/corneas clear, EOM's intact, fundi benign, both eyes      Throat: bETT Lungs: respirations unlabored Heart: Regular rate and rhythm Extremities: Extremities normal, atraumatic, no cyanosis or edema Pulses: 2+ and symmetric all extremities Skin: Skin color, texture, turgor normal, no rashes or lesions  NEUROLOGIC:   Mental status: sedated and intuabted Motor Exam - MAE well to noxious stimuli Sensory Exam - UTA Reflexes: symmetric, no pathologic reflexes, No Hoffman's, No clonus Coordination - UTa Gait -uta Balance - uta Cranial Nerves: I: smell Not tested  II: visual acuity  OS: na    OD: na  II: visual Brackins UTa  II: pupils UTA  III,VII: ptosis UTa  III,IV,VI: extraocular muscles  UTA  V: mastication UTa  V: facial light touch sensation  UTA  V,VII: corneal reflex  UTA  VII: facial muscle function - upper  UTA  VII: facial muscle function -  lower UTA  VIII: hearing UTA  IX: soft palate elevation  UTA  IX,X: gag reflex UTA  XI: trapezius strength  UTA  XI: sternocleidomastoid strength UTA  XI: neck flexion strength  UTA  XII: tongue strength  UTA    Data Review Lab Results  Component Value Date   WBC 14.9 (H) 01/01/2022   HGB 12.6 01/01/2022   HCT 37.0 01/01/2022   MCV 96.8 01/01/2022   PLT 336 01/01/2022   Lab Results  Component Value Date   NA 132 (L) 01/01/2022   K 4.5 01/01/2022   CL 96 (L) 01/01/2022   CO2 27 01/01/2022   BUN 7 (L) 01/01/2022   CREATININE 1.17 (H) 01/01/2022   GLUCOSE 129 (H) 01/01/2022   Lab Results  Component Value Date   INR 1.0 01/01/2022    Radiology: CT HEAD WO CONTRAST  Result Date: 01/01/2022 CLINICAL DATA:  Trauma. EXAM: CT HEAD WITHOUT CONTRAST CT  MAXILLOFACIAL WITHOUT CONTRAST CT CERVICAL SPINE WITHOUT CONTRAST TECHNIQUE: Multidetector CT imaging of the head, cervical spine, and maxillofacial structures were performed using the standard protocol without intravenous contrast. Multiplanar CT image reconstructions of the cervical spine and maxillofacial structures were also generated. RADIATION DOSE REDUCTION: This exam was performed according to the departmental dose-optimization program which includes automated exposure control, adjustment of the mA and/or kV according to patient size and/or use of iterative reconstruction technique. COMPARISON:  Head CT dated 11/13/2019. FINDINGS: CT HEAD FINDINGS Brain: The ventricles and sulci appropriate size for patient's age. Mild periventricular and deep white matter chronic microvascular ischemic changes noted. There is a 7 mm high attenuating focus along the posterior septum pellucidum most consistent with an acute intraventricular hemorrhage (18/3). No mass effect or midline shift. No extra-axial fluid collection. Vascular: No hyperdense vessel or unexpected calcification. Skull: Normal. Negative for fracture or focal lesion. Other: Laceration of the skin over the right lateral forehead. No large hematoma. CT MAXILLOFACIAL FINDINGS Osseous: No acute fracture.  No mandibular subluxation. Orbits: The globes and retro-orbital fat are preserved. Sinuses: Clear. Soft tissues: Mild right periorbital contusion.  No large hematoma. CT CERVICAL SPINE FINDINGS Alignment: No acute subluxation. Skull base and vertebrae: No acute cervical spine fracture. Chronic fragmentation of the tip of the odontoid process. Nondisplaced fracture of the spinous process of T3. Soft tissues and spinal canal: No prevertebral fluid or swelling. No visible canal hematoma. Disc levels: No acute findings. Degenerative changes primarily at C4-C5 and C5-C6. Upper chest: Bilateral pulmonary streaky densities. Other: An endotracheal and an enteric tube  are partially visualized. IMPRESSION: 1. Small acute intraventricular hemorrhage. 2. No acute facial bone fractures. 3. No acute cervical spine fracture or subluxation. Nondisplaced fracture of the spinous process of T3. These results were discussed in person with Dr. Bobbye Morton at the time of imaging on 01/01/2022 at 8:59 pm. Electronically Signed   By: Anner Crete M.D.   On: 01/01/2022 21:01   CT MAXILLOFACIAL WO CONTRAST  Result Date: 01/01/2022 CLINICAL DATA:  Trauma. EXAM: CT HEAD WITHOUT CONTRAST CT MAXILLOFACIAL WITHOUT CONTRAST CT CERVICAL SPINE WITHOUT CONTRAST TECHNIQUE: Multidetector CT imaging of the head, cervical spine, and maxillofacial structures were performed using the standard protocol without intravenous contrast. Multiplanar CT image reconstructions of the cervical spine and maxillofacial structures were also generated. RADIATION DOSE REDUCTION: This exam was performed according to the departmental dose-optimization program which includes automated exposure control, adjustment of the mA and/or kV according to patient size and/or use of iterative reconstruction technique. COMPARISON:  Head CT  dated 11/13/2019. FINDINGS: CT HEAD FINDINGS Brain: The ventricles and sulci appropriate size for patient's age. Mild periventricular and deep white matter chronic microvascular ischemic changes noted. There is a 7 mm high attenuating focus along the posterior septum pellucidum most consistent with an acute intraventricular hemorrhage (18/3). No mass effect or midline shift. No extra-axial fluid collection. Vascular: No hyperdense vessel or unexpected calcification. Skull: Normal. Negative for fracture or focal lesion. Other: Laceration of the skin over the right lateral forehead. No large hematoma. CT MAXILLOFACIAL FINDINGS Osseous: No acute fracture.  No mandibular subluxation. Orbits: The globes and retro-orbital fat are preserved. Sinuses: Clear. Soft tissues: Mild right periorbital contusion.  No  large hematoma. CT CERVICAL SPINE FINDINGS Alignment: No acute subluxation. Skull base and vertebrae: No acute cervical spine fracture. Chronic fragmentation of the tip of the odontoid process. Nondisplaced fracture of the spinous process of T3. Soft tissues and spinal canal: No prevertebral fluid or swelling. No visible canal hematoma. Disc levels: No acute findings. Degenerative changes primarily at C4-C5 and C5-C6. Upper chest: Bilateral pulmonary streaky densities. Other: An endotracheal and an enteric tube are partially visualized. IMPRESSION: 1. Small acute intraventricular hemorrhage. 2. No acute facial bone fractures. 3. No acute cervical spine fracture or subluxation. Nondisplaced fracture of the spinous process of T3. These results were discussed in person with Dr. Bobbye Morton at the time of imaging on 01/01/2022 at 8:59 pm. Electronically Signed   By: Anner Crete M.D.   On: 01/01/2022 21:01   CT CERVICAL SPINE WO CONTRAST  Result Date: 01/01/2022 CLINICAL DATA:  Trauma. EXAM: CT HEAD WITHOUT CONTRAST CT MAXILLOFACIAL WITHOUT CONTRAST CT CERVICAL SPINE WITHOUT CONTRAST TECHNIQUE: Multidetector CT imaging of the head, cervical spine, and maxillofacial structures were performed using the standard protocol without intravenous contrast. Multiplanar CT image reconstructions of the cervical spine and maxillofacial structures were also generated. RADIATION DOSE REDUCTION: This exam was performed according to the departmental dose-optimization program which includes automated exposure control, adjustment of the mA and/or kV according to patient size and/or use of iterative reconstruction technique. COMPARISON:  Head CT dated 11/13/2019. FINDINGS: CT HEAD FINDINGS Brain: The ventricles and sulci appropriate size for patient's age. Mild periventricular and deep white matter chronic microvascular ischemic changes noted. There is a 7 mm high attenuating focus along the posterior septum pellucidum most consistent  with an acute intraventricular hemorrhage (18/3). No mass effect or midline shift. No extra-axial fluid collection. Vascular: No hyperdense vessel or unexpected calcification. Skull: Normal. Negative for fracture or focal lesion. Other: Laceration of the skin over the right lateral forehead. No large hematoma. CT MAXILLOFACIAL FINDINGS Osseous: No acute fracture.  No mandibular subluxation. Orbits: The globes and retro-orbital fat are preserved. Sinuses: Clear. Soft tissues: Mild right periorbital contusion.  No large hematoma. CT CERVICAL SPINE FINDINGS Alignment: No acute subluxation. Skull base and vertebrae: No acute cervical spine fracture. Chronic fragmentation of the tip of the odontoid process. Nondisplaced fracture of the spinous process of T3. Soft tissues and spinal canal: No prevertebral fluid or swelling. No visible canal hematoma. Disc levels: No acute findings. Degenerative changes primarily at C4-C5 and C5-C6. Upper chest: Bilateral pulmonary streaky densities. Other: An endotracheal and an enteric tube are partially visualized. IMPRESSION: 1. Small acute intraventricular hemorrhage. 2. No acute facial bone fractures. 3. No acute cervical spine fracture or subluxation. Nondisplaced fracture of the spinous process of T3. These results were discussed in person with Dr. Bobbye Morton at the time of imaging on 01/01/2022 at 8:59 pm. Electronically Signed  By: Anner Crete M.D.   On: 01/01/2022 21:01   CT CHEST ABDOMEN PELVIS W CONTRAST  Result Date: 01/01/2022 CLINICAL DATA:  Trauma. EXAM: CT CHEST, ABDOMEN, AND PELVIS WITH CONTRAST TECHNIQUE: Multidetector CT imaging of the chest, abdomen and pelvis was performed following the standard protocol during bolus administration of intravenous contrast. RADIATION DOSE REDUCTION: This exam was performed according to the departmental dose-optimization program which includes automated exposure control, adjustment of the mA and/or kV according to patient size  and/or use of iterative reconstruction technique. CONTRAST:  139m OMNIPAQUE IOHEXOL 300 MG/ML  SOLN COMPARISON:  Chest radiograph dated 01/01/2022. FINDINGS: CT CHEST FINDINGS Cardiovascular: There is no cardiomegaly or pericardial effusion. There is calcification of the mitral annulus. Mild atherosclerotic calcification of the thoracic aorta. No aneurysmal dilatation or dissection. The origins of the great vessels of the aortic arch and the central pulmonary arteries appear patent as visualized. Mediastinum/Nodes: No hilar or mediastinal adenopathy. An enteric tube is noted within the esophagus. No mediastinal fluid collection. Lungs/Pleura: Bilateral lower lobe and posterior subpleural patchy subsegmental and streaky densities may represent atelectasis. Pulmonary contusions or pneumonia is not excluded clinical correlation is recommended. There is no pleural effusion or pneumothorax. The central airways are patent. Endotracheal tube with tip approximately 2 cm above the carina. Musculoskeletal: Old healed bilateral posterior rib fractures. Nondisplaced fracture of the T3 spinous process. CT ABDOMEN PELVIS FINDINGS No intra-abdominal free air or free fluid. Hepatobiliary: Probable mild fatty liver. There is a 2.2 cm cyst in the left lobe of the liver. Additional subcentimeter hypodense lesions are too small to characterize. There is no intrahepatic biliary dilatation. The gallbladder is unremarkable. Pancreas: Unremarkable. No pancreatic ductal dilatation or surrounding inflammatory changes. Spleen: Normal in size without focal abnormality. Adrenals/Urinary Tract: The adrenal glands unremarkable. The kidneys, visualized ureters, and urinary bladder appear unremarkable. Stomach/Bowel: Enteric tube with tip in the distal stomach. There is sigmoid diverticulosis without active inflammatory changes. No bowel obstruction or active inflammation. Appendectomy. Vascular/Lymphatic: Mild aortoiliac atherosclerotic disease.  The IVC is unremarkable. No portal venous gas. There is no adenopathy. Reproductive: The uterus and ovaries are grossly unremarkable. No pelvic mass. Other: None Musculoskeletal: L3-L4 posterior fusion. Degenerative changes of the lower lumbar spine. No acute osseous pathology. IMPRESSION: 1. Nondisplaced fracture of the T3 spinous process. 2. Bilateral lower lobe and posterior subpleural patchy subsegmental and streaky densities may represent atelectasis. Pulmonary contusions or pneumonia is not excluded clinical correlation is recommended. 3. No acute intra-abdominal or pelvic pathology. 4. Aortic Atherosclerosis (ICD10-I70.0). These results were discussed in person with Dr. LBobbye Mortonat the time of imaging on 01/01/2022 at 8:59 pm. Electronically Signed   By: AAnner CreteM.D.   On: 01/01/2022 21:00   DG Chest Portable 1 View  Result Date: 01/01/2022 CLINICAL DATA:  Intubation. EXAM: PORTABLE CHEST 1 VIEW COMPARISON:  Chest x-ray 01/01/2022 FINDINGS: Endotracheal tube tip is 3 cm above the carina. Enteric tube tip extends below the diaphragm into the stomach. The cardiomediastinal silhouette is within normal limits. There are minimal patchy opacities in the left lung base. There is no pleural effusion or pneumothorax. There are healed left-sided rib fractures. IMPRESSION: 1. Minimal left basilar atelectasis/airspace disease. 2. Endotracheal tube tip is 3 cm above the carina. Electronically Signed   By: ARonney AstersM.D.   On: 01/01/2022 20:57   DG Chest Portable 1 View  Result Date: 01/01/2022 CLINICAL DATA:  Unwitnessed fall EXAM: PORTABLE CHEST 1 VIEW COMPARISON:  11/15/2019 FINDINGS: No consolidation, pleural effusion  or pneumothorax. Lungs appear hyperinflated. Borderline cardiomegaly. Mild central airways thickening without focal airspace disease. IMPRESSION: 1. Negative for pneumothorax 2. Borderline cardiomegaly 3. Mild central airways thickening without focal airspace disease Electronically  Signed   By: Donavan Foil M.D.   On: 01/01/2022 19:06   DG Pelvis Portable  Result Date: 01/01/2022 CLINICAL DATA:  Unwitnessed fall. EXAM: PORTABLE PELVIS 1-2 VIEWS COMPARISON:  None Available. FINDINGS: There is no evidence of pelvic fracture or diastasis. There are degenerative changes of the pubic symphysis. No pelvic bone lesions are seen. Lower lumbar fusion hardware is partially visualized. IMPRESSION: Negative. Electronically Signed   By: Ronney Asters M.D.   On: 01/01/2022 18:38    Assessment/Plan: 71 year old female presented to the ED after sustaining a fall tonight. CT head showed a small amount of intraventricular hemorrhage and a spinous process fracture of T3. Follow up head CT in the morning. No need for brace for spinous process fracture.    Carly Larson 01/01/2022 11:04 PM

## 2022-01-01 NOTE — H&P (Signed)
NAME:  Carly Larson, MRN:  [id number], DOB:  09-Feb-1951, LOS: 1 ADMISSION DATE:  01/01/2022 CONSULTATION DATE:  01/01/2022 REFERRING MD:  Regenia Skeeter - EDP CHIEF COMPLAINT:  AMS, s/p unwitnessed fall  History of Present Illness:  71 year old woman who presented to [facility] ED 6/12 after she was found [place] her husband [place] the [place], s/p unwitnessed fall. PMHx significant for HTN, GERD, depression/anxiety, insomnia, dementia, Chiari 1 malformation, lumbar spondylolisthesis s/p lumbar fusion (L3-L4 08/2018).  History obtained from chart, as husband/family not available and patient is intubated/sedated. Per report, patient was more somnolent than usual prior to admission; she has dementia at baseline but is interactive/functional with assistance and confusion at times waxes/wanes. Husband assists her with ADLs including medication administration; she did not receive any additional doses. Reports that he found patient down in the [place] after a fall. Patient sustained a R lateral forehead laceration (repaired by EDP).   On ED arrival, patient was somnolent but was per report answering simple questions with EMS en route. Intubated for airway protection in the setting of somnolence/level 1 trauma. Vitals on arrival to ED demonstrated temp 97.6, tachycardia to 130s, mild hypertension, RR 25 and O2 sat 100%. Labs demonstrated mild leukocytosis with WBC 14.9, mild hyponatremia to 132, mild AKI with Cr 1.1 (baseline 0.8), normal INR 1.0. LA/PCT and BCx/UCx sent. Head/Neck imaging (CT Head, CT Maxillofacial, CT C-spine) demonstrated small acute IVH and nondisplaced fracture of T3 spinous process, otherwise negative. CT Chest/A/P demonstrated bilateral lower lobe/posteropr subpleural patchy subsegmental densities, ?atelectasis vs. pulmonary contusion vs PNA, otherwise negative for acute pathology. Trauma Surgery was consulted and recommended ICU admission. NSGY consulted for IVH and T3 spinous process fracture.  PCCM  consulted for ICU admission and management.  Pertinent Medical History:   Past Medical History:  Diagnosis Date   Anxiety state, unspecified    Arthritis    Carpal tunnel syndrome    left   Chiari malformation type I (HCC)    Depression    Disorder of bone and cartilage, unspecified    Encounter for long-term (current) use of other medications    Esophageal reflux    Headache    High risk medication use    Insomnia    Memory loss    MMSE 24/30 on 12/05/11, 30/30 on 10/25/13   Memory loss    Menopausal symptoms    Osteopenia    Hx of vitamin D deficiency   Osteopenia    Symptomatic menopausal or female climacteric states    White matter abnormality on MRI of brain    Significant Hospital Events: Including procedures, antibiotic start and stop dates in addition to other pertinent events   6/12 - Found [place]. EMS called and brought patient to [facility] as a Level 1 trauma. Answering simple questions with EMS. Intubated on arrival to ED for somnolence, trauma concerns. CT Head with acute small IVH, C-spine with nondisplaced T3 spinous process fx, CT Chest with atelectasis vs. PNA, no acute A/P pathology. R forehead lac repaired by EDP. Trauma/NSGY consulted. PCCM consulted for ICU admission.  Interim History / Subjective:  PCCM consulted for ICU admission and management  Objective:  Blood pressure (!) 126/48, pulse 63, temperature 97.6 F (36.4 C), temperature source Oral, resp. rate (!) 28, SpO2 100 %.    Vent Mode: PRVC FiO2 (%):  [60 %] 60 % Set Rate:  [22 bmp-28 bmp] 28 bmp Vt Set:  [360 mL] 360 mL PEEP:  [5 cmH20]  [place] Pressure:  [19 cmH20] 19 cmH20   Intake/Output Summary (Last 24 hours) at 01/02/2022 0010 Last data filed at 01/01/2022 2310 Gross per 24 hour  Intake 1109 ml  Output --  Net 1109 ml   There were no vitals filed for this visit.  Physical Examination: General: Well-developed, well-nourished elderly woman in NAD.  Intubated, sedated. HEENT: Normocephalic, R forehead/temporal laceration, ~5cm in length with suture closure. R superior periorbital hematoma anicteric sclera, PERRL 74m and sluggishly reactive to light, moist mucous membranes. ETT/OGT in place. Dried blood on R side of face. Cervical collar in place. Neuro: Sedated. Wakes with small amount of tactile stimuli.Not following commands. Withdraws to pain in all 4 extremities.  +Corneal, +Cough, and +Gag. CV: RRR, no m/g/r. PULM: Breathing even and unlabored on vent (PEEP 5, FiO2 60%), occasionally dyssynchronous with waking. Lung Bolger diminished at bilateral bases. GI: Soft, not apparently tender to palpation, moderately distended. Hypoactive bowel sounds. Extremities: No significant LE edema noted. Skin: Warm/dry, no rashes.  Resolved Hospital Problem List:    Assessment & Plan:  Acute IVH s/p unwitnessed fall Nondisplaced T3 spinous process fracture History of Chiari 1 malformation [place]. [facility] Answering simple questions with EMS. Intubated on arrival to ED for somnolence, trauma concerns. CT Head with acute small IVH, C-spine with nondisplaced T3 spinous process fx. Trauma/NSGY consulted.  - Admit to 4N Neuro/Trauma ICU - Trauma/NSGY consulted, appreciate recs - Head/Neck imaging as above, repeat CT Head 6/13AM - C-collar clearance per NSGY, ok to remove - Keppra x 7 days for seizure ppx - EEG if any concern for seizure activity  Altered mental status with acute encephalopathy Differential includes hypercarbia, infectious process, polypharmacy in the setting of dementia - Intubated on ED arrival - Trend ABG on vent - Trend WBC, fever curve - F/u LA - Follow Cx data - Continue empiric antibiotics - Hold home sedating medications (temazepam, mirtazapine)   Acute hypercarbic respiratory failure Atelectasis versus PNA, ?aspiration - Continue full  vent support (4-8cc/kg IBW) - Wean FiO2 for O2 sat > 90% - Daily WUA/SBT, as appropriate from a mental status standpoint - VAP bundle - Pulmonary hygiene - PAD protocol for sedation: Propofol and Fentanyl for goal RASS -1 to -2 - Empiric CAP coverage, ceftriaxone/azithromycin, low threshold to broaden if clinically worsening - Intermittent CXR - ABG 6/13AM  AKI, likely related to ATN vs. infection - Trend BMP - Replete electrolytes as indicated - Monitor I&Os - F/u urine studies - Avoid nephrotoxic agents as able - Ensure adequate renal perfusion  Lumbar spondylolisthesis s/p lumbar fusion S/p 1-level (L3-L4) fusion 08/2018, Dr. NLouanne Skye - Multiple pain medications on home med list; these appear to be old/related to prior surgery - Hold home pain medications for now in the setting of sedation  History of HTN - Hold home losartan in the setting of relative hypotension, AKI  GERD - PPI  Depression Anxiety History of dementia, memory loss - Hold home Namenda, Aricept for now, resume as clinically appropriate - Resume home Effexor as clinically appropriate - Holding home temazepam, mirtazapine as above  Best Practice: (right click and "Reselect all SmartList Selections" daily)   Diet/type: NPO w/ meds via tube DVT prophylaxis: SCDs, hold prophylactic AC until cleared by NSGY GI prophylaxis: PPI Lines: N/A Foley:  Yes, and it is still needed Code Status:  full code Last date of multidisciplinary goals of care  discussion [Pending]  Labs:  CBC: Recent Labs  Lab 01/01/22 1850 01/01/22 1857 01/01/22 2037  WBC 14.9*  --   --   HGB 14.8 15.6* 12.6  HCT 45.4 46.0 37.0  MCV 96.8  --   --   PLT 336  --   --    Basic Metabolic Panel: Recent Labs  Lab 01/01/22 1850 01/01/22 1857 01/01/22 2037  NA 132* 133* 132*  K 4.6 4.6 4.5  CL 96*  --   --   CO2 27  --   --   GLUCOSE 129*  --   --   BUN 7*  --   --   CREATININE 1.17*  --   --   CALCIUM 9.1  --   --     GFR: CrCl cannot be calculated (Unknown ideal weight.). Recent Labs  Lab 01/01/22 1850  WBC 14.9*   Liver Function Tests: Recent Labs  Lab 01/01/22 1850  AST 40  ALT 26  ALKPHOS 150*  BILITOT 0.7  PROT 7.4  ALBUMIN 4.1   No results for input(s): "LIPASE", "AMYLASE" in the last 168 hours. No results for input(s): "AMMONIA" in the last 168 hours.  ABG:    Component Value Date/Time   PHART 7.286 (L) 01/01/2022 2037   PCO2ART 59.9 (H) 01/01/2022 2037   PO2ART 277 (H) 01/01/2022 2037   HCO3 28.7 (H) 01/01/2022 2037   TCO2 31 01/01/2022 2037   ACIDBASEDEF 2.0 01/01/2022 1857   O2SAT 100 01/01/2022 2037    Coagulation Profile: Recent Labs  Lab 01/01/22 1850  INR 1.0   Cardiac Enzymes: No results for input(s): "CKTOTAL", "CKMB", "CKMBINDEX", "TROPONINI" in the last 168 hours.  HbA1C: No results found for: "HGBA1C"  CBG: No results for input(s): "GLUCAP" in the last 168 hours.  Review of Systems:   Patient is encephalopathic and/or intubated. Therefore history has been obtained from chart review.   Past Medical History:  She,  has a past medical history of Anxiety state, unspecified, Arthritis, Carpal tunnel syndrome, Chiari malformation type I ([place]), Depression, Disorder of bone and cartilage, unspecified, Encounter for long-term (current) use of other medications, Esophageal reflux, Headache, High risk medication use, Insomnia, Memory loss, Memory loss, Menopausal symptoms, Osteopenia, Osteopenia, Symptomatic menopausal or female climacteric states, and White matter abnormality on MRI of brain.   Surgical History:   Past Surgical History:  Procedure Laterality Date   APPENDECTOMY     CARPAL TUNNEL RELEASE Left    CATARACT EXTRACTION, BILATERAL     COLONOSCOPY     LUMBAR FUSION  09/09/2018    Social History:   reports that she has never smoked. She has never used smokeless tobacco. She reports that she does not drink alcohol and does not use drugs.    Family History:  Her family history includes Breast cancer in her maternal aunt; CAD in her brother; Dementia in her mother; Diabetes in her mother; Heart attack in her brother; Hypertension in her mother; Osteoarthritis in her mother; Stroke in her father.   Allergies: No Known Allergies   Home Medications: Prior to Admission medications   Medication Sig Start Date End Date Taking? Authorizing Provider  donepezil (ARICEPT) 23 MG TABS tablet Take 23 mg by mouth every evening.   Yes [provider]  losartan (COZAAR) 50 MG tablet Take 50 mg by mouth daily.   Yes [provider]  memantine (NAMENDA) 10 MG tablet Take 10 mg by mouth 2 (two) times daily.   Yes [provider]  venlafaxine XR (EFFEXOR-XR) 150 MG 24 hr capsule Take 150 mg by mouth 2 (two) times daily.   Yes [provider]  celecoxib (CELEBREX) 200 MG capsule Take 1 capsule (200 mg total) by mouth daily. 02/03/19   Jessy Oto, MD  cephALEXin (KEFLEX) 500 MG capsule Take 1 capsule (500 mg total) by mouth 4 (four) times daily. 09/24/18   Lanae Crumbly, PA-C  cholecalciferol (VITAMIN D3) 25 MCG (1000 UNIT) tablet Take 1 tablet (1,000 Units total) by mouth daily. 10/21/19   Jessy Oto, MD  DOK 100 MG capsule TAKE ONE CAPSULE BY MOUTH TWICE DAILY 07/07/19   Jessy Oto, MD  gabapentin (NEURONTIN) 300 MG capsule Take 1 capsule (300 mg total) by mouth 2 (two) times daily. 07/07/19   Mcarthur Rossetti, MD  HYDROcodone-acetaminophen (NORCO) 7.5-325 MG tablet Take 1-2 tablets by mouth every 4 (four) hours as needed for moderate pain. 09/12/18   Jessy Oto, MD  loratadine (CLARITIN) 10 MG tablet Take 10 mg by mouth daily.    [provider]  methocarbamol (ROBAXIN) 500 MG tablet Take 1 tablet (500 mg total) by mouth every 8 (eight) hours as needed for muscle spasms. 09/12/18   Jessy Oto, MD  mirtazapine (REMERON) 15 MG tablet Take 15 mg by mouth at bedtime.  05/26/18    [provider]  Misc Natural Products (GLUCOSAMINE CHONDROITIN TRIPLE) TABS Take 1 tablet by mouth daily.    [provider]  Multiple Minerals-Vitamins (CAL MAG ZINC +D3 PO) Take 1 tablet by mouth daily.    [provider]  Multiple Vitamin (MULTIVITAMIN) capsule Take 1 capsule by mouth daily.    [provider]  ondansetron (ZOFRAN) 4 MG tablet TAKE 1 TABLET BY MOUTH EVERY 6 HOURS AS NEEDED FOR NAUSEA AND VOMITING 07/07/19   Jessy Oto, MD  Potassium 99 MG TABS Take 99 mg by mouth daily.    [provider]  SUPER B COMPLEX/C PO Take 1 tablet by mouth daily.    [provider]  temazepam (RESTORIL) 30 MG capsule Take 30 mg by mouth at bedtime.     [provider]  thiamine (VITAMIN B-1) 100 MG tablet Take 100 mg by mouth daily.    [provider]  traMADol (ULTRAM) 50 MG tablet Take 1 tablet (50 mg total) by mouth every 6 (six) hours as needed for up to 7 days. Patient taking differently: Take 50 mg by mouth every 6 (six) hours as needed for moderate pain.  08/12/18 09/03/18  Jessy Oto, MD    Critical care time: 48 minutes   Lestine Mount, PA-C [facility] 01/02/22 12:10 AM  Please see [email or web address] for pager details.  From 7A-7P if no response, please call [phone] After hours, please call ELink [phone]

## 2022-01-02 ENCOUNTER — Inpatient Hospital Stay (HOSPITAL_COMMUNITY): Payer: Medicare HMO

## 2022-01-02 DIAGNOSIS — Z9911 Dependence on respirator [ventilator] status: Secondary | ICD-10-CM

## 2022-01-02 DIAGNOSIS — I615 Nontraumatic intracerebral hemorrhage, intraventricular: Secondary | ICD-10-CM

## 2022-01-02 DIAGNOSIS — J9601 Acute respiratory failure with hypoxia: Secondary | ICD-10-CM

## 2022-01-02 LAB — CBC
HCT: 40.4 % (ref 36.0–46.0)
Hemoglobin: 13.2 g/dL (ref 12.0–15.0)
MCH: 30.7 pg (ref 26.0–34.0)
MCHC: 32.7 g/dL (ref 30.0–36.0)
MCV: 94 fL (ref 80.0–100.0)
Platelets: 247 10*3/uL (ref 150–400)
RBC: 4.3 MIL/uL (ref 3.87–5.11)
RDW: 14 % (ref 11.5–15.5)
WBC: 15.7 10*3/uL — ABNORMAL HIGH (ref 4.0–10.5)
nRBC: 0 % (ref 0.0–0.2)

## 2022-01-02 LAB — POCT I-STAT 7, (LYTES, BLD GAS, ICA,H+H)
Acid-Base Excess: 3 mmol/L — ABNORMAL HIGH (ref 0.0–2.0)
Bicarbonate: 26.3 mmol/L (ref 20.0–28.0)
Calcium, Ion: 1.2 mmol/L (ref 1.15–1.40)
HCT: 34 % — ABNORMAL LOW (ref 36.0–46.0)
Hemoglobin: 11.6 g/dL — ABNORMAL LOW (ref 12.0–15.0)
O2 Saturation: 97 %
Patient temperature: 37.1
Potassium: 3.9 mmol/L (ref 3.5–5.1)
Sodium: 137 mmol/L (ref 135–145)
TCO2: 27 mmol/L (ref 22–32)
pCO2 arterial: 36.4 mmHg (ref 32–48)
pH, Arterial: 7.466 — ABNORMAL HIGH (ref 7.35–7.45)
pO2, Arterial: 81 mmHg — ABNORMAL LOW (ref 83–108)

## 2022-01-02 LAB — GLUCOSE, CAPILLARY
Glucose-Capillary: 155 mg/dL — ABNORMAL HIGH (ref 70–99)
Glucose-Capillary: 136 mg/dL — ABNORMAL HIGH (ref 70–99)
Glucose-Capillary: 151 mg/dL — ABNORMAL HIGH (ref 70–99)

## 2022-01-02 LAB — BASIC METABOLIC PANEL
Anion gap: 11 (ref 5–15)
BUN: 6 mg/dL — ABNORMAL LOW (ref 8–23)
CO2: 26 mmol/L (ref 22–32)
Calcium: 8.8 mg/dL — ABNORMAL LOW (ref 8.9–10.3)
Chloride: 100 mmol/L (ref 98–111)
Creatinine, Ser: 1.03 mg/dL — ABNORMAL HIGH (ref 0.44–1.00)
GFR, Estimated: 58 mL/min — ABNORMAL LOW (ref >60)
Glucose, Bld: 83 mg/dL (ref 70–99)
Potassium: 4.7 mmol/L (ref 3.5–5.1)
Sodium: 137 mmol/L (ref 135–145)

## 2022-01-02 LAB — MAGNESIUM
Magnesium: 1.9 mg/dL (ref 1.7–2.4)
Magnesium: 2.1 mg/dL (ref 1.7–2.4)

## 2022-01-02 LAB — LACTIC ACID, PLASMA
Lactic Acid, Venous: 1.4 mmol/L (ref 0.5–1.9)
Lactic Acid, Venous: 1.5 mmol/L (ref 0.5–1.9)
Lactic Acid, Venous: 2.3 mmol/L (ref 0.5–1.9)
Lactic Acid, Venous: 2.9 mmol/L (ref 0.5–1.9)

## 2022-01-02 LAB — MRSA NEXT GEN BY PCR, NASAL: MRSA by PCR Next Gen: NOT DETECTED

## 2022-01-02 LAB — PHOSPHORUS: Phosphorus: 3.2 mg/dL (ref 2.5–4.6)

## 2022-01-02 LAB — PROCALCITONIN: Procalcitonin: <0.1 ng/mL

## 2022-01-02 LAB — TRIGLYCERIDES: Triglycerides: 127 mg/dL (ref <150)

## 2022-01-02 MED ORDER — VITAL HIGH PROTEIN PO LIQD: 1000.0000 mL | ORAL | Status: DC

## 2022-01-02 MED ORDER — PROSOURCE TF PO LIQD
45.0000 mL | Freq: Two times a day (BID) | ORAL | Status: DC
  Administered 2022-01-02: 45 mL
  Filled 2022-01-02 (×2): qty 45

## 2022-01-02 MED ORDER — CHLORHEXIDINE GLUCONATE 0.12% ORAL RINSE (MEDLINE KIT)
15.0000 mL | Freq: Two times a day (BID) | OROMUCOSAL | Status: DC
  Administered 2022-01-02 – 2022-01-03 (×3): 15 mL via OROMUCOSAL

## 2022-01-02 MED ORDER — LACTATED RINGERS IV BOLUS
1000.0000 mL | Freq: Once | INTRAVENOUS | Status: AC
Start: 2022-01-02 — End: 2022-01-02
  Administered 2022-01-02: 1000 mL via INTRAVENOUS

## 2022-01-02 MED ORDER — OSMOLITE 1.2 CAL PO LIQD
1000.0000 mL | ORAL | Status: DC
Start: 2022-01-02 — End: 2022-01-03
  Administered 2022-01-02: 1000 mL

## 2022-01-02 MED ORDER — ACETAMINOPHEN 160 MG/5ML PO SOLN: 650.0000 mg | Freq: Four times a day (QID) | ORAL | Status: DC | PRN

## 2022-01-02 MED ORDER — LEVETIRACETAM IN NACL 500 MG/100ML IV SOLN
500.0000 mg | Freq: Two times a day (BID) | INTRAVENOUS | Status: AC
  Administered 2022-01-02 – 2022-01-08 (×14): 500 mg via INTRAVENOUS
  Filled 2022-01-02 (×14): qty 100

## 2022-01-02 MED ORDER — DEXMEDETOMIDINE HCL IN NACL 400 MCG/100ML IV SOLN
0.4000 ug/kg/h | INTRAVENOUS | Status: DC
  Administered 2022-01-02: 1 ug/kg/h via INTRAVENOUS
  Administered 2022-01-02: 1.2 ug/kg/h via INTRAVENOUS
  Administered 2022-01-02: 1 ug/kg/h via INTRAVENOUS
  Administered 2022-01-02: 0.4 ug/kg/h via INTRAVENOUS
  Administered 2022-01-03: 1 ug/kg/h via INTRAVENOUS
  Filled 2022-01-02 (×5): qty 100

## 2022-01-02 MED ORDER — NOREPINEPHRINE 4 MG/250ML-% IV SOLN
2.0000 ug/min | INTRAVENOUS | Status: DC
  Administered 2022-01-02: 4 ug/min via INTRAVENOUS
  Filled 2022-01-02: qty 250

## 2022-01-02 MED ORDER — ORAL CARE MOUTH RINSE
15.0000 mL | OROMUCOSAL | Status: DC
  Administered 2022-01-02 – 2022-01-03 (×13): 15 mL via OROMUCOSAL

## 2022-01-02 MED ORDER — VENLAFAXINE HCL 75 MG PO TABS
75.0000 mg | ORAL_TABLET | Freq: Two times a day (BID) | ORAL | Status: DC
  Administered 2022-01-02 (×2): 75 mg
  Filled 2022-01-02 (×4): qty 1

## 2022-01-02 MED ORDER — CHLORHEXIDINE GLUCONATE CLOTH 2 % EX PADS
6.0000 | MEDICATED_PAD | Freq: Every day | CUTANEOUS | Status: DC
  Administered 2022-01-03 – 2022-01-10 (×8): 6 via TOPICAL

## 2022-01-02 MED ORDER — SODIUM CHLORIDE 0.9 % IV SOLN
2.0000 g | INTRAVENOUS | Status: DC
  Administered 2022-01-02 – 2022-01-04 (×2): 2 g via INTRAVENOUS
  Filled 2022-01-02 (×2): qty 20

## 2022-01-02 MED ORDER — SODIUM CHLORIDE 0.9 % IV SOLN
250.0000 mL | INTRAVENOUS | Status: DC
  Administered 2022-01-02: 250 mL via INTRAVENOUS

## 2022-01-02 MED ORDER — MEMANTINE HCL 10 MG PO TABS
10.0000 mg | ORAL_TABLET | Freq: Two times a day (BID) | ORAL | Status: DC
Start: 2022-01-02 — End: 2022-01-03
  Administered 2022-01-02 (×2): 10 mg
  Filled 2022-01-02 (×4): qty 1

## 2022-01-02 MED ORDER — TEMAZEPAM 15 MG PO CAPS
15.0000 mg | ORAL_CAPSULE | Freq: Every day | ORAL | Status: DC
  Administered 2022-01-02: 15 mg
  Filled 2022-01-02: qty 1

## 2022-01-02 NOTE — Progress Notes (Signed)
Son and husband updated at bedside. They brought a bottle of oral dilaudid '4mg'$  that the husband has that has about 50 pills missing. He thinks she picked this up when he was diving his meds up into his pill divider yesterday. She has done similar things before due to confusion 2/2 dementia. Her husband reported he has to show her where the bathrooms are at home when she has to go. She has never taken pills with the intention to hurt herself. They think she took the pills around 4pm yesterday.   Meds brought to the hospital by family-- reviewed with PharmD.  Julian Hy, DO 01/02/22 9:02 AM [facility]

## 2022-01-02 NOTE — Progress Notes (Signed)
NAME:  Carly Larson, MRN:  [id number], DOB:  07-27-1950, LOS: 1 ADMISSION DATE:  01/01/2022 CONSULTATION DATE:  01/01/2022 REFERRING MD:  Regenia Skeeter - EDP CHIEF COMPLAINT:  AMS, s/p unwitnessed fall  History of Present Illness:  71 year old woman who presented to [facility] ED 6/12 after she was found [place] her [place], s/p unwitnessed fall. PMHx significant for HTN, GERD, depression/anxiety, insomnia, dementia, Chiari 1 malformation, lumbar spondylolisthesis s/p lumbar fusion (L3-L4 08/2018).  History obtained from chart, as husband/family not available and patient is intubated/sedated. Per report, patient was more somnolent than usual prior to admission; she has dementia at baseline but is interactive/functional with assistance and confusion at times waxes/wanes. Husband assists her with ADLs including medication administration; she did not receive any additional doses. Reports that he found patient down in the [place] after a fall. Patient sustained a R lateral forehead laceration (repaired by EDP).   On ED arrival, patient was somnolent but was per report answering simple questions with EMS en route. Intubated for airway protection in the setting of somnolence/level 1 trauma. Vitals on arrival to ED demonstrated temp 97.6, tachycardia to 130s, mild hypertension, RR 25 and O2 sat 100%. Labs demonstrated mild leukocytosis with WBC 14.9, mild hyponatremia to 132, mild AKI with Cr 1.1 (baseline 0.8), normal INR 1.0. LA/PCT and BCx/UCx sent. Head/Neck imaging (CT Head, CT Maxillofacial, CT C-spine) demonstrated small acute IVH and nondisplaced fracture of T3 spinous process, otherwise negative. CT Chest/A/P demonstrated bilateral lower lobe/posteropr subpleural patchy subsegmental densities, ?atelectasis vs. pulmonary contusion vs PNA, otherwise negative for acute pathology. Trauma Surgery was consulted and recommended ICU admission. NSGY consulted for IVH and T3 spinous process fracture.  PCCM  consulted for ICU admission and management.  Pertinent Medical History:   Past Medical History:  Diagnosis Date   Anxiety state, unspecified    Arthritis    Carpal tunnel syndrome    left   Chiari malformation type I (HCC)    Depression    Disorder of bone and cartilage, unspecified    Encounter for long-term (current) use of other medications    Esophageal reflux    Headache    High risk medication use    Insomnia    Memory loss    MMSE 24/30 on 12/05/11, 30/30 on 10/25/13   Memory loss    Menopausal symptoms    Osteopenia    Hx of vitamin D deficiency   Osteopenia    Symptomatic menopausal or female climacteric states    White matter abnormality on MRI of brain    Significant Hospital Events: Including procedures, antibiotic start and stop dates in addition to other pertinent events   6/12 - Found [place]. EMS called and brought patient to [facility] as a Level 1 trauma. Answering simple questions with EMS. Intubated on arrival to ED for somnolence, trauma concerns. CT Head with acute small IVH, C-spine with nondisplaced T3 spinous process fx, CT Chest with atelectasis vs. PNA, no acute A/P pathology. R forehead lac repaired by EDP. Trauma/NSGY consulted. PCCM consulted for ICU admission.  Interim History / Subjective:  PCCM consulted for ICU admission and management  Objective:  Blood pressure (!) 106/40, pulse 70, temperature 98.7 F (37.1 C), temperature source Axillary, resp. rate (!) 28, weight 79.2 kg, SpO2 98 %.    Vent Mode: PRVC FiO2 (%):  [40 %-60 %] 40 % Set Rate:  [22 bmp-28 bmp] 28 bmp Vt Set:  [360 mL] 360 mL  PEEP:  [5 cmH20] 5 cmH20 Plateau Pressure:  [18 cmH20-20 cmH20] 18 cmH20   Intake/Output Summary (Last 24 hours) at 01/02/2022 0716 Last data filed at 01/02/2022 0600 Gross per 24 hour  Intake 1682.96 ml  Output 1645 ml  Net 37.96 ml    Filed Weights   01/02/22 0120  Weight: 79.2 kg    Physical Examination: General:  critically ill appearing woman lying in bed in NAD, intubated and lightly sedated HEENT: swollen, bruised R eye, ETT and OGT in place Neck: aspen collar in place Neuro: RASS -1, agitated, following commands occasionally. Only somewhat verbally redirectable. Moving both arms. CV: S1S2, RRR PULM: mild rhonchi, small amount of thick tan secretions from ETT GI: soft, NT Extremities: no peripheral edema, no cyanosis Skin: no rashes, warm  7.47/36/81/26 BUN 6 Cr 1.03 LA 2.3> 2.9 WBC 15.7 H/H 11.6/34 UA: WBC 0-5, nitrite +  Resolved Hospital Problem List:    Assessment & Plan:  Acute IVH due to fall Nondisplaced T3 spinous process fracture History of Chiari 1 malformation - repeat head CT this morning - can remove C-collar per NS consult note - keppra x 7 days for seizure prophylaxis -resume low dose temazepam to limit seizure risk -start TF if not extubated today  Acute encephalopathy- potentially post concussive plus SAH with baseline dementia. Not sure if fall was mechanical or due to syncope or potentially polypharmacy. - follow cultures -con't empiric antibiotics -hold PTA meds mirtazapine; resume lower dose temazepam to reduce risk of withdrawal seizure -precedex, fentanyl PRN for sedation to help with agitation on MV  Acute hypercarbic and hypoxic respiratory failure Atelectasis versus PNA, likely aspiration with bilateral lower low infiltrates on CT - LTVV -VAP prevention protocol -PAD protocol for sedation -daily SAT & SBT-- failed this morning due to low volumes; can try again after sedation has been off longer. -con't empiric ceftriaxone; trach aspirate culture -wean FiO2 as able to maintain SpO2 >90%  AKI, likely related to ATN vs. Infection vs pre-renal AKI - strict I/Os -additional 1L LR -monitor renal function -avoid nephrotoxic meds, renally dose meds -maintain adequate renal perfusion  Lumbar spondylolisthesis s/p lumbar fusion S/p 1-level (L3-L4)  fusion 08/2018, Dr. Louanne Skye. - needs med rec-- want to ensure she was not on chronic pain meds -fentanyl PRN for pain  History of HTN -hold losartan -can add labetalol PRN for SBP sustained >160  GERD - PPI  Depression Anxiety History of dementia, memory loss - resume namenda-- some evidence this helps in TBI. Hold aricept for now.  -resume effexor at reduced dose to prevent withdrawal-- resume at half PTA dose - Holding home mirtazapine as above  Best Practice: (right click and "Reselect all SmartList Selections" daily)   Diet/type: NPO w/ meds via tube DVT prophylaxis: SCDs, hold prophylactic AC until cleared by NSGY GI prophylaxis: PPI Lines: N/A Foley:  Yes, and it is still needed Code Status:  full code Last date of multidisciplinary goals of care discussion '[ ]'$   Labs:  CBC: Recent Labs  Lab 01/01/22 1850 01/01/22 1857 01/01/22 2037 01/02/22 0255  WBC 14.9*  --   --  15.7*  HGB 14.8 15.6* 12.6 13.2  11.6*  HCT 45.4 46.0 37.0 40.4  34.0*  MCV 96.8  --   --  94.0  PLT 336  --   --  175    Basic Metabolic Panel: Recent Labs  Lab 01/01/22 1850 01/01/22 1857 01/01/22 2037 01/01/22 2327 01/02/22 0255  NA 132* 133* 132*  --  137  137  K 4.6 4.6 4.5  --  4.7  3.9  CL 96*  --   --   --  100  CO2 27  --   --   --  26  GLUCOSE 129*  --   --   --  83  BUN 7*  --   --   --  6*  CREATININE 1.17*  --   --   --  1.03*  CALCIUM 9.1  --   --   --  8.8*  MG  --   --   --  1.9 2.1  PHOS  --   --   --   --  3.2    GFR: CrCl cannot be calculated (Unknown ideal weight.). Recent Labs  Lab 01/01/22 1850 01/01/22 2327 01/01/22 2332 01/02/22 0255  PROCALCITON  --  <0.10  --   --   WBC 14.9*  --   --  15.7*  LATICACIDVEN  --   --  2.3* 2.9*    Liver Function Tests: Recent Labs  Lab 01/01/22 1850  AST 40  ALT 26  ALKPHOS 150*  BILITOT 0.7  PROT 7.4  ALBUMIN 4.1    No results for input(s): "LIPASE", "AMYLASE" in the last 168 hours. No results for  input(s): "AMMONIA" in the last 168 hours.  ABG:    Component Value Date/Time   PHART 7.466 (H) 01/02/2022 0255   PCO2ART 36.4 01/02/2022 0255   PO2ART 81 (L) 01/02/2022 0255   HCO3 26.3 01/02/2022 0255   TCO2 27 01/02/2022 0255   ACIDBASEDEF 2.0 01/01/2022 1857   O2SAT 97 01/02/2022 0255     This patient is critically ill with multiple organ system failure which requires frequent high complexity decision making, assessment, support, evaluation, and titration of therapies. This was completed through the application of advanced monitoring technologies and extensive interpretation of multiple databases. During this encounter critical care time was devoted to patient care services described in this note for 50 minutes.  Julian Hy, DO 01/02/22 8:35 AM [facility]

## 2022-01-02 NOTE — Progress Notes (Signed)
Patient ID: Carly Larson, female   DOB: 06-Jul-1951, 71 y.o.   MRN: [id number] Noted Neurosurgery recommendations and F/U CT head results. Please call Trauma if we can be of further assistance.  Georganna Skeans, MD, MPH, FACS Please use [email or web address] to contact on call provider

## 2022-01-02 NOTE — Progress Notes (Signed)
RT NOTE:  Pt transported on vent to 4N24 without event. Report given to [place], RT

## 2022-01-02 NOTE — TOC CAGE-AID Note (Signed)
Transition of Care [facility]) - CAGE-AID Screening   Patient Details  Name: Carly Larson MRN: [id number] Date of Birth: 02-03-1951  Transition of Care [facility]) CM/SW Contact:    Casady Voshell C Tarpley-Carter, [place] Phone Number: 01/02/2022, 9:46 AM   Clinical Narrative: Pt is unable to participate in Cage Aid. Pt is experiencing dementia.  Melitza Metheny Tarpley-Carter, MSW, LCSW-A Pronouns:  She/Her/Hers [facility]itions of Care Clinical Social Worker Direct Number:  [phone] [email or web address]  CAGE-AID Screening: Substance Abuse Screening unable to be completed due to: : Patient unable to participate

## 2022-01-02 NOTE — Progress Notes (Signed)
[facility] Progress Note Patient Name: Carly Larson DOB: [birth year] MRN: [id number]   Date of Service  01/02/2022  HPI/Events of Note  Admission Head CT Scan reveals a small intraventricular hemorrhage. Neurosurgery consultant desires follow up Head CT Scan in AM.  eICU Interventions  Plan: Head CT Scan w/o contrast at 9 AM.     Intervention Category Major Interventions: Other:  Lysle Dingwall 01/02/2022, 4:18 AM

## 2022-01-02 NOTE — Progress Notes (Signed)
Patient was transported to CT and back to 4N24 without any complications.

## 2022-01-02 NOTE — Progress Notes (Addendum)
[facility] Progress Note Patient Name: Carly Larson DOB: [birth year] MRN: [id number]   Date of Service  01/02/2022  HPI/Events of Note  Fever to 100.4 F - Nursing request for Tylenol. AST and ALT both are normal. Patient is already on antibiotics.  eICU Interventions  Plan: Tylenol liquid 650 mg per tube Q 6 hours PRN Temp > 101.0 F.     Intervention Category Major Interventions: Other:  Lysle Dingwall 01/02/2022, 8:26 PM

## 2022-01-03 DIAGNOSIS — J9601 Acute respiratory failure with hypoxia: Secondary | ICD-10-CM | POA: Diagnosis not present

## 2022-01-03 DIAGNOSIS — Z9911 Dependence on respirator [ventilator] status: Secondary | ICD-10-CM | POA: Diagnosis not present

## 2022-01-03 DIAGNOSIS — I615 Nontraumatic intracerebral hemorrhage, intraventricular: Secondary | ICD-10-CM | POA: Diagnosis not present

## 2022-01-03 LAB — GLUCOSE, CAPILLARY
Glucose-Capillary: 108 mg/dL — ABNORMAL HIGH (ref 70–99)
Glucose-Capillary: 109 mg/dL — ABNORMAL HIGH (ref 70–99)
Glucose-Capillary: 121 mg/dL — ABNORMAL HIGH (ref 70–99)
Glucose-Capillary: 145 mg/dL — ABNORMAL HIGH (ref 70–99)
Glucose-Capillary: 80 mg/dL (ref 70–99)
Glucose-Capillary: 98 mg/dL (ref 70–99)

## 2022-01-03 LAB — BASIC METABOLIC PANEL
Anion gap: 9 (ref 5–15)
BUN: 10 mg/dL (ref 8–23)
CO2: 23 mmol/L (ref 22–32)
Calcium: 8.3 mg/dL — ABNORMAL LOW (ref 8.9–10.3)
Chloride: 103 mmol/L (ref 98–111)
Creatinine, Ser: 0.96 mg/dL (ref 0.44–1.00)
GFR, Estimated: >60 mL/min (ref >60)
Glucose, Bld: 116 mg/dL — ABNORMAL HIGH (ref 70–99)
Potassium: 3.9 mmol/L (ref 3.5–5.1)
Sodium: 135 mmol/L (ref 135–145)

## 2022-01-03 LAB — MAGNESIUM: Magnesium: 1.9 mg/dL (ref 1.7–2.4)

## 2022-01-03 LAB — PHOSPHORUS: Phosphorus: 2.6 mg/dL (ref 2.5–4.6)

## 2022-01-03 MED ORDER — SENNOSIDES-DOCUSATE SODIUM 8.6-50 MG PO TABS: 1.0000 | ORAL_TABLET | Freq: Two times a day (BID) | ORAL | Status: DC

## 2022-01-03 MED ORDER — ACETAMINOPHEN 325 MG PO TABS
650.0000 mg | ORAL_TABLET | Freq: Four times a day (QID) | ORAL | Status: DC | PRN
Start: 2022-01-03 — End: 2022-01-05

## 2022-01-03 MED ORDER — HEPARIN SODIUM (PORCINE) 5000 UNIT/ML IJ SOLN
5000.0000 [IU] | Freq: Three times a day (TID) | INTRAMUSCULAR | Status: DC
Start: 2022-01-03 — End: 2022-01-13
  Administered 2022-01-03 – 2022-01-12 (×27): 5000 [IU] via SUBCUTANEOUS
  Filled 2022-01-03 (×26): qty 1

## 2022-01-03 MED ORDER — TEMAZEPAM 15 MG PO CAPS: 15.0000 mg | ORAL_CAPSULE | Freq: Every day | ORAL | Status: DC

## 2022-01-03 MED ORDER — POLYETHYLENE GLYCOL 3350 17 G PO PACK: 17.0000 g | PACK | Freq: Every day | ORAL | Status: DC

## 2022-01-03 MED ORDER — MEMANTINE HCL 10 MG PO TABS
10.0000 mg | ORAL_TABLET | Freq: Two times a day (BID) | ORAL | Status: DC
  Filled 2022-01-03 (×4): qty 1

## 2022-01-03 MED ORDER — VENLAFAXINE HCL 75 MG PO TABS
75.0000 mg | ORAL_TABLET | Freq: Two times a day (BID) | ORAL | Status: DC
  Filled 2022-01-03 (×4): qty 1

## 2022-01-03 NOTE — Progress Notes (Signed)
   This pt is active with [facility]- which is a home based palliative care program provided by [place]. We have been providing nursing visits and SW visits to assist with the symptom needs of her chronic illnesses. She is eligible to return home with our services and if needed, she eligible have additional [facility] services with our care.   Thank you Webb Silversmith RN [phone]

## 2022-01-03 NOTE — Progress Notes (Signed)
NAME:  Carly Larson, MRN:  [id number], DOB:  1951/05/20, LOS: 2 ADMISSION DATE:  01/01/2022 CONSULTATION DATE:  01/01/2022 REFERRING MD:  Regenia Skeeter - EDP CHIEF COMPLAINT:  AMS, s/p unwitnessed fall  History of Present Illness:  71 year old woman who presented to [facility] ED 6/12 after she was found [place] her husband in the [place], s/p unwitnessed fall. PMHx significant for HTN, GERD, depression/anxiety, insomnia, dementia, Chiari 1 malformation, lumbar spondylolisthesis s/p lumbar fusion (L3-L4 08/2018).  History obtained from chart, as husband/family not available and patient is intubated/sedated. Per report, patient was more somnolent than usual prior to admission; she has dementia at baseline but is interactive/functional with assistance and confusion at times waxes/wanes. Husband assists her with ADLs including medication administration; she did not receive any additional doses. Reports that he found patient down in the [place] after a fall. Patient sustained a R lateral forehead laceration (repaired by EDP).   On ED arrival, patient was somnolent but was per report answering simple questions with EMS en route. Intubated for airway protection in the setting of somnolence/level 1 trauma. Vitals on arrival to ED demonstrated temp 97.6, tachycardia to 130s, mild hypertension, RR 25 and O2 sat 100%. Labs demonstrated mild leukocytosis with WBC 14.9, mild hyponatremia to 132, mild AKI with Cr 1.1 (baseline 0.8), normal INR 1.0. LA/PCT and BCx/UCx sent. Head/Neck imaging (CT Head, CT Maxillofacial, CT C-spine) demonstrated small acute IVH and nondisplaced fracture of T3 spinous process, otherwise negative. CT Chest/A/P demonstrated bilateral lower lobe/posteropr subpleural patchy subsegmental densities, ?atelectasis vs. pulmonary contusion vs PNA, otherwise negative for acute pathology. Trauma Surgery was consulted and recommended ICU admission. NSGY consulted for IVH and T3 spinous process fracture.  PCCM  consulted for ICU admission and management.  Pertinent Medical History:   Past Medical History:  Diagnosis Date   Anxiety state, unspecified    Arthritis    Carpal tunnel syndrome    left   Chiari malformation type I (HCC)    Depression    Disorder of bone and cartilage, unspecified    Encounter for long-term (current) use of other medications    Esophageal reflux    Headache    High risk medication use    Insomnia    Memory loss    MMSE 24/30 on 12/05/11, 30/30 on 10/25/13   Memory loss    Menopausal symptoms    Osteopenia    Hx of vitamin D deficiency   Osteopenia    Symptomatic menopausal or female climacteric states    White matter abnormality on MRI of brain    Significant Hospital Events: Including procedures, antibiotic start and stop dates in addition to other pertinent events   6/12 - [place]. EMS called and brought patient to [facility] as a Level 1 trauma. Answering simple questions with EMS. Intubated on arrival to ED for somnolence, trauma concerns. CT Head with acute small IVH, C-spine with nondisplaced T3 spinous process fx, CT Chest with atelectasis vs. PNA, no acute A/P pathology. R forehead lac repaired by EDP. Trauma/NSGY consulted. PCCM consulted for ICU admission.  Interim History / Subjective:  Overnight some agitation. Has pulled out 2 Ivs. Tmax 100.4.  Objective:  Blood pressure (!) 108/44, pulse (!) 59, temperature 97.8 F (36.6 C), temperature source Axillary, resp. rate (!) 28, weight 79.2 kg, SpO2 95 %.    Vent Mode: PRVC FiO2 (%):  [40 %] 40 % Set Rate:  [28 bmp] 28 bmp Vt Set:  [360 mL]  360 mL PEEP:  [5 cmH20] 5 cmH20 Plateau Pressure:  [17 cmH20-18 cmH20] 17 cmH20   Intake/Output Summary (Last 24 hours) at 01/03/2022 7341 Last data filed at 01/03/2022 0600 Gross per 24 hour  Intake 2702.41 ml  Output 555 ml  Net 2147.41 ml    Filed Weights   01/02/22 0120  Weight: 79.2 kg    Physical Examination: General:  critically ill appearing woman lying in bed in NAD HEENT: right eye remains bruised and swollen, abrasion on R forehead bandaged, no signs of active bleeding Neuro: RASS -2, spontaneously moving around and respositioning herself in bed, moving all extremities. Spontaneously swallowing. CV: S1S2 PULM: CTAB, breathing comfortably on 5/5 with Vt ~500cc GI: soft, NT Extremities: no peripheral edema, no cyanosis Skin: warm, dry, no diffuse rashes GU: foley with yellow urine  Resp culture> few WBCs Blood culture> NGTD   Resolved Hospital Problem List:    Assessment & Plan:  Acute IVH due to fall Nondisplaced T3 spinous process fracture History of Chiari 1 malformation Dilated ventricles-- degenerative change a/w dementia but not related to [facility] per NS -no additional head imaging needed -keppra x 7 days for seizure prophylaxis -resume full dose temazepam today -supportive care  Acute encephalopathy- potentially post concussive plus SAH with baseline dementia. Not sure if fall was mechanical or due to syncope or potentially polypharmacy. - follow cultures and continue empiric antibiotics -ok to resume mirtazapine and amantadine -resume full dose temazepam -wean off precedex post-extubation  Acute hypercarbic and hypoxic respiratory failure Atelectasis versus PNA, likely aspiration with bilateral lower low infiltrates on CT - LTVV -SAT & SBT> passed SBT, extubating to [place] -VAP prevention protcool -follow trach aspirate cultures -con't empiric ceftriaxone -wean O2 to maintain SpO2 >90%  AKI, likely related to ATN vs. infection vs pre-renal AKI- resolved -strict I/Os -con't to monitor -renally dose meds and avoid nephrotoxic meds -maintain adequate renal perfusion  Lumbar spondylolisthesis s/p lumbar fusion S/p 1-level (L3-L4) fusion 08/2018, Dr. Louanne Skye. -tylenol and fentanyl PRN for pain  History of HTN -con't to hold losartan -can add labetalol PRN for SBP sustained  >160  GERD - con't PPI  Depression Anxiety History of dementia, memory loss -Con't namenda, restart effexor, temazepam, & aricept at PTA doses -Palliative care consult for help with planning after discharge-- her husband wants to keep her at home, but this has been increasingly difficult as her dementia has progressed.  Daughter updated at bedside  Best Practice: (right click and "Reselect all SmartList Selections" daily)   Diet/type: NPO w/ meds via tube DVT prophylaxis: prophylactic heparin  GI prophylaxis: PPI Lines: N/A Foley:  Yes, and it is still needed Code Status:  full code Last date of multidisciplinary goals of care discussion '[ ]'$   Labs:  CBC: Recent Labs  Lab 01/01/22 1850 01/01/22 1857 01/01/22 2037 01/02/22 0255  WBC 14.9*  --   --  15.7*  HGB 14.8 15.6* 12.6 13.2  11.6*  HCT 45.4 46.0 37.0 40.4  34.0*  MCV 96.8  --   --  94.0  PLT 336  --   --  937    Basic Metabolic Panel: Recent Labs  Lab 01/01/22 1850 01/01/22 1857 01/01/22 2037 01/01/22 2327 01/02/22 0255 01/03/22 0109  NA 132* 133* 132*  --  137  137  --   K 4.6 4.6 4.5  --  4.7  3.9  --   CL 96*  --   --   --  100  --  CO2 27  --   --   --  26  --   GLUCOSE 129*  --   --   --  83  --   BUN 7*  --   --   --  6*  --   CREATININE 1.17*  --   --   --  1.03*  --   CALCIUM 9.1  --   --   --  8.8*  --   MG  --   --   --  1.9 2.1 1.9  PHOS  --   --   --   --  3.2 2.6    GFR: CrCl cannot be calculated (Unknown ideal weight.). Recent Labs  Lab 01/01/22 1850 01/01/22 2327 01/01/22 2332 01/02/22 0255 01/02/22 0819 01/02/22 1052  PROCALCITON  --  <0.10  --   --   --   --   WBC 14.9*  --   --  15.7*  --   --   LATICACIDVEN  --   --  2.3* 2.9* 1.4 1.5    Liver Function Tests: Recent Labs  Lab 01/01/22 1850  AST 40  ALT 26  ALKPHOS 150*  BILITOT 0.7  PROT 7.4  ALBUMIN 4.1    No results for input(s): "LIPASE", "AMYLASE" in the last 168 hours. No results for input(s):  "AMMONIA" in the last 168 hours.  ABG:    Component Value Date/Time   PHART 7.466 (H) 01/02/2022 0255   PCO2ART 36.4 01/02/2022 0255   PO2ART 81 (L) 01/02/2022 0255   HCO3 26.3 01/02/2022 0255   TCO2 27 01/02/2022 0255   ACIDBASEDEF 2.0 01/01/2022 1857   O2SAT 97 01/02/2022 0255     This patient is critically ill with multiple organ system failure which requires frequent high complexity decision making, assessment, support, evaluation, and titration of therapies. This was completed through the application of advanced monitoring technologies and extensive interpretation of multiple databases. During this encounter critical care time was devoted to patient care services described in this note for 55 minutes.  Julian Hy, DO 01/03/22 9:29 AM [facility]

## 2022-01-03 NOTE — Progress Notes (Signed)
US guided PIV placed in left upper arm. Spoke with primary nurse TK and Dr. Carlis Abbott and instructed them to infuse pressors through PIV in left lower arm. Patient has iWatch in place. No pressors can go through upper arm IV's. Reminded team that after 72 hours of peripheral pressors a PICC or central line is needed. Dr. Carlis Abbott stated that they're hoping to get her off pressors today. Right arm is restricted due to previous infiltrate.   Carly Larson Lorita Officer, RN

## 2022-01-03 NOTE — Progress Notes (Signed)
Palliative:  Consult received. Chart reviewed. Discussed case with patient's outpatient palliative liaison to inquire about available outpatient services. Spoke with nurse - patient confused and sleeping.  No family at bedside. Some concern of MCI in spouse so called daughter to schedule meeting - no answer, voicemail left with call back number.  Juel Burrow, DNP, AGNP-C Palliative Medicine Team Team Phone # [phone]  Pager # [phone]  NO CHARGE

## 2022-01-03 NOTE — Progress Notes (Signed)
Patient ID: Carly Larson, female   DOB: 1951-03-28, 71 y.o.   MRN: [id number] Patient remains intubated but in the process of being extubated.  Moves all extremities well  CT scan shows very mild ventriculomegaly I think this is mostly ventriculomegaly ex vacuo.  I do not think that its hydrocephalus therefore do not see any neurosurgical intervention needed.

## 2022-01-03 NOTE — Procedures (Signed)
Extubation Procedure Note  Patient Details:   Name: Carly Larson DOB: 1951/03/23 MRN: [id number]   Airway Documentation:    Vent end date: 01/03/22 Vent end time: 0915   Evaluation  O2 sats: stable throughout Complications: No apparent complications Patient did tolerate procedure well. Bilateral Breath Sounds: Clear   Yes  Patient was extubated to a 6L [place] without any complications, dsypnea or stridor noted. Positive cuff leak prior to extubation.  Naeema Patlan, Eddie North 01/03/2022, 9:15 AM

## 2022-01-03 NOTE — Progress Notes (Signed)
SLP Cancellation Note  Patient Details Name: Carly Larson MRN: [id number] DOB: [birth year]   Cancelled treatment:        Attempted bedside swallow assessment. Pt could not arouse with max stimulation( trap squeeze, sternal rub) eyes open briefly then falls back asleep. Will continue efforts.    Houston Siren 01/03/2022, 3:17 PM

## 2022-01-04 DIAGNOSIS — J690 Pneumonitis due to inhalation of food and vomit: Secondary | ICD-10-CM | POA: Diagnosis not present

## 2022-01-04 DIAGNOSIS — J9601 Acute respiratory failure with hypoxia: Secondary | ICD-10-CM | POA: Diagnosis not present

## 2022-01-04 DIAGNOSIS — E871 Hypo-osmolality and hyponatremia: Secondary | ICD-10-CM

## 2022-01-04 DIAGNOSIS — G9340 Encephalopathy, unspecified: Secondary | ICD-10-CM

## 2022-01-04 DIAGNOSIS — I615 Nontraumatic intracerebral hemorrhage, intraventricular: Secondary | ICD-10-CM | POA: Diagnosis not present

## 2022-01-04 LAB — GLUCOSE, CAPILLARY
Glucose-Capillary: 90 mg/dL (ref 70–99)
Glucose-Capillary: 103 mg/dL — ABNORMAL HIGH (ref 70–99)
Glucose-Capillary: 103 mg/dL — ABNORMAL HIGH (ref 70–99)
Glucose-Capillary: 90 mg/dL (ref 70–99)

## 2022-01-04 LAB — MAGNESIUM: Magnesium: 1.9 mg/dL (ref 1.7–2.4)

## 2022-01-04 LAB — CBC WITH DIFFERENTIAL/PLATELET
Abs Immature Granulocytes: 0.09 10*3/uL — ABNORMAL HIGH (ref 0.00–0.07)
Basophils Absolute: 0.1 10*3/uL (ref 0.0–0.1)
Basophils Relative: 0 %
Eosinophils Absolute: 0.1 10*3/uL (ref 0.0–0.5)
Eosinophils Relative: 1 %
HCT: 36 % (ref 36.0–46.0)
Hemoglobin: 11.9 g/dL — ABNORMAL LOW (ref 12.0–15.0)
Immature Granulocytes: 1 %
Lymphocytes Relative: 12 %
Lymphs Abs: 2.1 10*3/uL (ref 0.7–4.0)
MCH: 31.3 pg (ref 26.0–34.0)
MCHC: 33.1 g/dL (ref 30.0–36.0)
MCV: 94.7 fL (ref 80.0–100.0)
Monocytes Absolute: 1.4 10*3/uL — ABNORMAL HIGH (ref 0.1–1.0)
Monocytes Relative: 8 %
Neutro Abs: 14.2 10*3/uL — ABNORMAL HIGH (ref 1.7–7.7)
Neutrophils Relative %: 78 %
Platelets: 243 10*3/uL (ref 150–400)
RBC: 3.8 MIL/uL — ABNORMAL LOW (ref 3.87–5.11)
RDW: 14 % (ref 11.5–15.5)
WBC: 18 10*3/uL — ABNORMAL HIGH (ref 4.0–10.5)
nRBC: 0 % (ref 0.0–0.2)

## 2022-01-04 LAB — COMPREHENSIVE METABOLIC PANEL
ALT: 18 U/L (ref 0–44)
AST: 33 U/L (ref 15–41)
Albumin: 2.5 g/dL — ABNORMAL LOW (ref 3.5–5.0)
Alkaline Phosphatase: 97 U/L (ref 38–126)
Anion gap: 8 (ref 5–15)
BUN: 8 mg/dL (ref 8–23)
CO2: 23 mmol/L (ref 22–32)
Calcium: 8.1 mg/dL — ABNORMAL LOW (ref 8.9–10.3)
Chloride: 103 mmol/L (ref 98–111)
Creatinine, Ser: 0.79 mg/dL (ref 0.44–1.00)
GFR, Estimated: >60 mL/min (ref >60)
Glucose, Bld: 96 mg/dL (ref 70–99)
Potassium: 3.9 mmol/L (ref 3.5–5.1)
Sodium: 134 mmol/L — ABNORMAL LOW (ref 135–145)
Total Bilirubin: 0.4 mg/dL (ref 0.3–1.2)
Total Protein: 5.3 g/dL — ABNORMAL LOW (ref 6.5–8.1)

## 2022-01-04 LAB — CULTURE, RESPIRATORY W GRAM STAIN: Culture: NORMAL

## 2022-01-04 LAB — PHOSPHORUS: Phosphorus: 3.8 mg/dL (ref 2.5–4.6)

## 2022-01-04 MED ORDER — PIPERACILLIN-TAZOBACTAM 3.375 G IVPB 30 MIN
3.3750 g | Freq: Once | INTRAVENOUS | Status: AC
  Administered 2022-01-04: 3.375 g via INTRAVENOUS
  Filled 2022-01-04 (×2): qty 50

## 2022-01-04 MED ORDER — PIPERACILLIN-TAZOBACTAM 3.375 G IVPB
3.3750 g | Freq: Three times a day (TID) | INTRAVENOUS | Status: AC
  Administered 2022-01-04 – 2022-01-11 (×21): 3.375 g via INTRAVENOUS
  Filled 2022-01-04 (×21): qty 50

## 2022-01-04 MED ORDER — PANTOPRAZOLE SODIUM 40 MG IV SOLR
40.0000 mg | INTRAVENOUS | Status: DC
  Administered 2022-01-04 – 2022-01-05 (×2): 40 mg via INTRAVENOUS
  Filled 2022-01-04 (×2): qty 10

## 2022-01-04 MED ORDER — ACETAMINOPHEN 650 MG RE SUPP
650.0000 mg | Freq: Four times a day (QID) | RECTAL | Status: DC | PRN
Start: 2022-01-04 — End: 2022-01-06
  Filled 2022-01-04: qty 1

## 2022-01-04 MED ORDER — OLANZAPINE 10 MG IM SOLR: 10.0000 mg | Freq: Once | INTRAMUSCULAR | Status: DC | PRN

## 2022-01-04 MED ORDER — SODIUM CHLORIDE 0.9 % IV SOLN: INTRAVENOUS | Status: AC

## 2022-01-04 MED ORDER — MAGNESIUM SULFATE 2 GM/50ML IV SOLN
2.0000 g | Freq: Once | INTRAVENOUS | Status: AC
  Administered 2022-01-04: 2 g via INTRAVENOUS
  Filled 2022-01-04: qty 50

## 2022-01-04 MED ORDER — FENTANYL CITRATE PF 50 MCG/ML IJ SOSY: 12.5000 ug | PREFILLED_SYRINGE | INTRAMUSCULAR | Status: DC | PRN

## 2022-01-04 MED ORDER — HYDRALAZINE HCL 20 MG/ML IJ SOLN
10.0000 mg | Freq: Four times a day (QID) | INTRAMUSCULAR | Status: DC | PRN
  Administered 2022-01-04 – 2022-01-05 (×2): 10 mg via INTRAVENOUS
  Filled 2022-01-04 (×2): qty 1

## 2022-01-04 NOTE — Progress Notes (Signed)
NAME:  Carly Larson, MRN:  [id number], DOB:  04-Jan-1951, LOS: 3 ADMISSION DATE:  01/01/2022 CONSULTATION DATE:  01/01/2022 REFERRING MD:  Regenia Skeeter - EDP CHIEF COMPLAINT:  AMS, s/p unwitnessed fall  History of Present Illness:  71 year old woman who presented to [facility] ED 6/12 after she was found down by her [place] in the [place], s/p unwitnessed fall. PMHx significant for HTN, GERD, depression/anxiety, insomnia, dementia, Chiari 1 malformation, lumbar spondylolisthesis s/p lumbar fusion (L3-L4 08/2018).  History obtained from chart, as husband/family not available and patient is intubated/sedated. Per report, patient was more somnolent than usual prior to admission; she has dementia at baseline but is interactive/functional with assistance and confusion at times waxes/wanes. Husband assists her with ADLs including medication administration; she did not receive any additional doses. Reports that he found patient down in the [place] after a fall. Patient sustained a R lateral forehead laceration (repaired by EDP).   On ED arrival, patient was somnolent but was per report answering simple questions with EMS en route. Intubated for airway protection in the setting of somnolence/level 1 trauma. Vitals on arrival to ED demonstrated temp 97.6, tachycardia to 130s, mild hypertension, RR 25 and O2 sat 100%. Labs demonstrated mild leukocytosis with WBC 14.9, mild hyponatremia to 132, mild AKI with Cr 1.1 (baseline 0.8), normal INR 1.0. LA/PCT and BCx/UCx sent. Head/Neck imaging (CT Head, CT Maxillofacial, CT C-spine) demonstrated small acute IVH and nondisplaced fracture of T3 spinous process, otherwise negative. CT Chest/A/P demonstrated bilateral lower lobe/posteropr subpleural patchy subsegmental densities, ?atelectasis vs. pulmonary contusion vs PNA, otherwise negative for acute pathology. Trauma Surgery was consulted and recommended ICU admission. NSGY consulted for IVH and T3 spinous process fracture.  PCCM  consulted for ICU admission and management.  Pertinent Medical History:   Past Medical History:  Diagnosis Date   Anxiety state, unspecified    Arthritis    Carpal tunnel syndrome    left   Chiari malformation type I (HCC)    Depression    Disorder of bone and cartilage, unspecified    Encounter for long-term (current) use of other medications    Esophageal reflux    Headache    High risk medication use    Insomnia    Memory loss    MMSE 24/30 on 12/05/11, 30/30 on 10/25/13   Memory loss    Menopausal symptoms    Osteopenia    Hx of vitamin D deficiency   Osteopenia    Symptomatic menopausal or female climacteric states    White matter abnormality on MRI of brain    Significant Hospital Events: Including procedures, antibiotic start and stop dates in addition to other pertinent events   6/12 - [place]. EMS called and brought patient to [facility] as a Level 1 trauma. Answering simple questions with EMS. Intubated on arrival to ED for somnolence, trauma concerns. CT Head with acute small IVH, C-spine with nondisplaced T3 spinous process fx, CT Chest with atelectasis vs. PNA, no acute A/P pathology. R forehead lac repaired by EDP. Trauma/NSGY consulted. PCCM consulted for ICU admission.  Interim History / Subjective:  Required NTS overnight. Tmax 101.2. Off pressors since yesterday.  Objective:  Blood pressure (!) 141/52, pulse 87, temperature 98.9 F (37.2 C), temperature source Axillary, resp. rate 13, weight 79.2 kg, SpO2 97 %.        Intake/Output Summary (Last 24 hours) at 01/04/2022 0849 Last data filed at 01/04/2022 0700 Gross per 24 hour  Intake 1467.79  ml  Output 1050 ml  Net 417.79 ml    Filed Weights   01/02/22 0120  Weight: 79.2 kg    Physical Examination: General: ill appearing woman lying in bed in NAD HEENT: R forehead abrasion- bandaged. R eye bruise unchanged. Eyes anicteric. Neuro: Arouses to stimulation, but mostly sleepy  at baseline. Moving L>R side. Follows commands BLE, LUE. Not moving RUE spontaneously. Trying to reposition herself in bed. Moderate strength cough. CV: S1S2, mildly tachycardic PULM: course rhonchi bilaterally, purulent secretions bilaterally GI: soft, NT Extremities: no edema, no cyanosis Skin: warm, dry, no diffuse rashes  Resp culture> few WBCs>  Blood culture> NGTD Na+ 134 BUN 8 Cr 0.79 WBC 18 H/H 11.9/36  Resolved Hospital Problem List:    Assessment & Plan:  Acute IVH due to fall Nondisplaced T3 spinous process fracture History of Chiari 1 malformation Dilated ventricles-- degenerative change a/w dementia but not related to [facility] per NS -no additional head imaging needed unless neuro change -keppra x 7 days for seizure prophylaxis -con't PTA meds- effexor, temazepam -con't supportive care- not ready to leave ICU yet due to mental status, respiratory status  Acute encephalopathy- potentially post concussive plus SAH with baseline dementia. Not sure if fall was mechanical or due to syncope or potentially polypharmacy. -follow cultures -escalate antibiotics to zosyn -con't PTA mirtazapine and amantadine -con't PTA temazepam -needs cortrak  Acute hypercarbic and hypoxic respiratory failure Atelectasis versus PNA, likely aspiration with bilateral lower low infiltrates on CT - nasal trumpet placed to facilitate NTS; NTS PRN -needs ongoing monitoring in ICU as she remains at risk for reintubation -con't antibiotics -supplemental O2 to maintain SpO2 >90%  Hyponatremia -1L NS -con't to monitor  AKI, likely related to ATN vs. infection vs pre-renal AKI- resolved -strict I/Os -monitor -renally dose meds and avoid nephrotoxic meds -maintain adequate renal perfusion  Lumbar spondylolisthesis s/p lumbar fusion S/p 1-level (L3-L4) fusion 08/2018, Dr. Louanne Skye. -tylenol and PRN fentanyl for pain  History of HTN -con't to hold losartan -hydralazine PRN for SBP sustained  >160  GERD -PPI  Depression Anxiety History of dementia, memory loss -Con't namenda, effexor, temazepam, & aricept at PTA doses -Appreciate Palliative care's input  No family at bedside during rounds today.   Best Practice: (right click and "Reselect all SmartList Selections" daily)   Diet/type: NPO w/ meds via tube DVT prophylaxis: prophylactic heparin  GI prophylaxis: PPI Lines: N/A Foley:  N/A Code Status:  full code Last date of multidisciplinary goals of care discussion [6/13 ]  Labs:  CBC: Recent Labs  Lab 01/01/22 1850 01/01/22 1857 01/01/22 2037 01/02/22 0255 01/04/22 0310  WBC 14.9*  --   --  15.7* 18.0*  NEUTROABS  --   --   --   --  14.2*  HGB 14.8 15.6* 12.6 13.2  11.6* 11.9*  HCT 45.4 46.0 37.0 40.4  34.0* 36.0  MCV 96.8  --   --  94.0 94.7  PLT 336  --   --  247 008    Basic Metabolic Panel: Recent Labs  Lab 01/01/22 1850 01/01/22 1857 01/01/22 2037 01/01/22 2327 01/02/22 0255 01/03/22 0109 01/04/22 0310  NA 132* 133* 132*  --  137  137 135 134*  K 4.6 4.6 4.5  --  4.7  3.9 3.9 3.9  CL 96*  --   --   --  100 103 103  CO2 27  --   --   --  '26 23 23  '$ GLUCOSE 129*  --   --   --  83 116* 96  BUN 7*  --   --   --  6* 10 8  CREATININE 1.17*  --   --   --  1.03* 0.96 0.79  CALCIUM 9.1  --   --   --  8.8* 8.3* 8.1*  MG  --   --   --  1.9 2.1 1.9 1.9  PHOS  --   --   --   --  3.2 2.6 3.8    GFR: CrCl cannot be calculated (Unknown ideal weight.). Recent Labs  Lab 01/01/22 1850 01/01/22 2327 01/01/22 2332 01/02/22 0255 01/02/22 0819 01/02/22 1052 01/04/22 0310  PROCALCITON  --  <0.10  --   --   --   --   --   WBC 14.9*  --   --  15.7*  --   --  18.0*  LATICACIDVEN  --   --  2.3* 2.9* 1.4 1.5  --     Liver Function Tests: Recent Labs  Lab 01/01/22 1850 01/04/22 0310  AST 40 33  ALT 26 18  ALKPHOS 150* 97  BILITOT 0.7 0.4  PROT 7.4 5.3*  ALBUMIN 4.1 2.5*    No results for input(s): "LIPASE", "AMYLASE" in the last 168  hours. No results for input(s): "AMMONIA" in the last 168 hours.  ABG:    Component Value Date/Time   PHART 7.466 (H) 01/02/2022 0255   PCO2ART 36.4 01/02/2022 0255   PO2ART 81 (L) 01/02/2022 0255   HCO3 26.3 01/02/2022 0255   TCO2 27 01/02/2022 0255   ACIDBASEDEF 2.0 01/01/2022 1857   O2SAT 97 01/02/2022 0255     This patient is critically ill with multiple organ system failure which requires frequent high complexity decision making, assessment, support, evaluation, and titration of therapies. This was completed through the application of advanced monitoring technologies and extensive interpretation of multiple databases. During this encounter critical care time was devoted to patient care services described in this note for 38 minutes.  Julian Hy, DO 01/04/22 9:47 AM  [facility]

## 2022-01-04 NOTE — Progress Notes (Signed)
O2 requirements stable. Still rhonchi bilaterally on exam. NTS with thick secretions. Nasal trumped remains in place.   Adding CPT Q4h, con't NTS PRN.  Julian Hy, DO 01/04/22 3:28 PM [place] [facility]

## 2022-01-04 NOTE — Progress Notes (Signed)
[facility] ADULT ICU REPLACEMENT PROTOCOL   The patient does apply for the [facility] Adult ICU Electrolyte Replacment Protocol based on the criteria listed below:   1.Exclusion criteria: TCTS patients, ECMO patients, and Dialysis patients 2. Is GFR >/= 30 ml/min? Yes.    Patient's GFR today is >60 3. Is SCr </= 2? Yes.   Patient's SCr is 0.79 mg/dL 4. Did SCr increase >/= 0.5 in 24 hours? No. 5.Pt's weight >40kg  Yes.   6. Abnormal electrolyte(s): mag 1.9  7. Electrolytes replaced per protocol 8.  Call MD STAT for K+ </= 2.5, Phos </= 1, or Mag </= 1 Physician:  n/a  Carly Larson 01/04/2022 4:23 AM

## 2022-01-04 NOTE — Progress Notes (Signed)
eLink Physician-Brief Progress Note Patient Name: KAYTLEN LIGHTSEY DOB: [birth year] MRN: [id number]   Date of Service  01/04/2022  HPI/Events of Note  Patient with agitated delirium and increased fall risk secondary to dementia.  eICU Interventions  Zyprexa 10 mg IM x 1 PRN, soft waist restraints ordered.        Frederik Pear 01/04/2022, 7:46 PM

## 2022-01-04 NOTE — IPAL (Signed)
  Interdisciplinary Goals of Care Family Meeting   Date carried out:: 01/04/2022  Location of the meeting: Phone conference  Member's involved: Physician and Family Member or next of kin  Durable Power of Attorney or acting medical decision maker: husband- Jeani Hawking    Discussion: We discussed goals of care for [employer] .  I updated him on his wife's care and my concern for her respiratory status. She remains at risk for reintubation due to ongoing need for respiratory support. He does not want her to be reintubated. He understands that this could potentially mean she may not survive, and he agrees. If we would not pursue MV, resuscitation during an arrest would likely be unsuccessful and seems inappropriate. He agrees. Code status updated. RN present in the room with the husband during phone conversation. If her respiratory failure progresses to the point where she is failing, we will transition to comfort care. Her husband would like to be notified if this occurs.  Code status: Full DNR, DNI  Disposition: Continue current acute care, transition to comfort if respiratory decompensation.    Time spent for the meeting: 5 min.  Julian Hy 01/04/2022, 4:09 PM

## 2022-01-04 NOTE — Progress Notes (Signed)
Palliative:  Spoke with patient's daughter [name] - scheduled meeting for 6/16 at 3:30 PM  Nunda, DNP, [facility] Palliative Medicine Team Team Phone # [phone]  Pager # [phone]  NO CHARGE

## 2022-01-04 NOTE — Evaluation (Signed)
Physical Therapy Evaluation Patient Details Name: Carly Larson MRN: [id number] DOB: 1951/05/07 Today's Date: 01/04/2022  History of Present Illness  71 y.o. female presents to [facility] hospital on 01/01/2022 after fall, with AMS. Pt required intubation due to hypercarbria on 6/12. CT head shows small IVH with T3 spinous process fx. Pt extubated 6/14.PMH includes anxiety, chiari malformation, depression, osteopenia, dementia.  Clinical Impression  Pt presents to PT with deficits in functional mobility, strength, power, cognition, balance, cardiopulmonary function. Pt with wet respiratory sounds at rest prior to initiating mobility, unable to produce a functional cough to clear secretions during session. Pt maintains sitting balance well but fatigues quickly in standing and requires significant physical assistance to stand and perform pre-gait activities. Pt with desaturation and tachycardia, resolves once suctioned by RN. Pt will benefit from continued attempts at mobilization in an effort to reduce falls risk and caregiver burden.       Recommendations for follow up therapy are one component of a multi-disciplinary discharge planning process, led by the attending physician.  Recommendations may be updated based on patient status, additional functional criteria and insurance authorization.  Follow Up Recommendations Skilled nursing-short term rehab (<3 hours/day)    Assistance Recommended at Discharge Frequent or constant Supervision/Assistance  Patient can return home with the following  Two people to help with walking and/or transfers;Two people to help with bathing/dressing/bathroom;Assistance with cooking/housework;Assistance with feeding;Direct supervision/assist for medications management;Direct supervision/assist for financial management;Assist for transportation;Help with stairs or ramp for entrance    Equipment Recommendations  (TBD pending further mobility)  Recommendations for Other  Services       Functional Status Assessment Patient has had a recent decline in their functional status and demonstrates the ability to make significant improvements in function in a reasonable and predictable amount of time.     Precautions / Restrictions Precautions Precautions: Fall Precaution Comments: monitor SpO2 Restrictions Weight Bearing Restrictions: No      Mobility  Bed Mobility Overal bed mobility: Needs Assistance Bed Mobility: Supine to Sit, Sit to Supine     Supine to sit: Mod assist Sit to supine: Total assist, +2 for physical assistance   General bed mobility comments: significant assistance back to bed due to fatigue and safety awareness deficits as pt near edge of bed    Transfers Overall transfer level: Needs assistance Equipment used: 2 person hand held assist Transfers: Sit to/from Stand Sit to Stand: Mod assist, +2 physical assistance                Ambulation/Gait             Pre-gait activities: attempted side steps at edge of bed, PT having to provide max-totalA to slide each foot along with continued support to maintain upright. Pt oftens sinking into flexed posture with buttocks leaning back onto bed    Stairs            Wheelchair Mobility    Modified Rankin (Stroke Patients Only)       Balance Overall balance assessment: Needs assistance Sitting-balance support: No upper extremity supported, Feet supported Sitting balance-Leahy Scale: Poor Sitting balance - Comments: minG-minA   Standing balance support: Bilateral upper extremity supported Standing balance-Leahy Scale: Poor Standing balance comment: min-modA x2 with bilateral hand hold                             Pertinent Vitals/Pain Pain Assessment Pain Assessment: Faces Faces Pain Scale: Hurts  little more Pain Location: when suctioned Pain Descriptors / Indicators: Grimacing Pain Intervention(s): Monitored during session    Home Living  Family/patient expects to be discharged to:: Private residence Living Arrangements: Spouse/significant other Available Help at Discharge: Family;Available 24 hours/day Type of Home: House Home Access: Level entry       Home Layout: One level Home Equipment: Shower seat      Prior Function Prior Level of Function : Needs assist  Cognitive Assist : Mobility (cognitive);ADLs (cognitive) Mobility (Cognitive): Set up cues ADLs (Cognitive): Intermittent cues Physical Assist : ADLs (physical)   ADLs (physical): Toileting;IADLs;Dressing;Grooming Mobility Comments: pt ambulates without device, 2 falls in last year. At times requires guidance for direction due to cognitive deficits ADLs Comments: Husband gives cues due to dementia.  Pt can feed self and toilet with only cues. Husband assists with bathing, dressing, grooming and all IADLS. Pt cannot be left alone. Pt's husband states the two times she has fallen she has gotten into meds and taken too many when he did not see.  Husband does all care for pt and is noticibly exhausted.     Hand Dominance   Dominant Hand: Right    Extremity/Trunk Assessment   Upper Extremity Assessment Upper Extremity Assessment: Overall WFL for tasks assessed    Lower Extremity Assessment Lower Extremity Assessment: Generalized weakness    Cervical / Trunk Assessment Cervical / Trunk Assessment: Other exceptions Cervical / Trunk Exceptions: T3 spinous process fx  Communication   Communication: Expressive difficulties (seems limited by inability to clear secretions)  Cognition Arousal/Alertness: Lethargic Behavior During Therapy: Restless Overall Cognitive Status: Difficult to assess Area of Impairment: Awareness, Problem solving, Safety/judgement, Attention                   Current Attention Level: Sustained     Safety/Judgement: Decreased awareness of safety, Decreased awareness of deficits Awareness: Intellectual Problem Solving: Slow  processing, Requires verbal cues, Requires tactile cues General Comments: difficult to assess due to communication deficits        General Comments General comments (skin integrity, edema, etc.): pt on 8L HFNC upon arrival. Pt becomes tachycardic and desats to 85% after return to supine, appears unable to clear secretions. RN made aware and performs suction. Pt VSS upon PT departure, pt much more calm and with less cogested breath sounds.    Exercises     Assessment/Plan    PT Assessment Patient needs continued PT services  PT Problem List Decreased strength;Decreased activity tolerance;Decreased balance;Decreased mobility;Decreased knowledge of use of DME;Decreased cognition;Decreased safety awareness;Decreased knowledge of precautions;Cardiopulmonary status limiting activity       PT Treatment Interventions DME instruction;Functional mobility training;Therapeutic activities;Therapeutic exercise;Balance training;Cognitive remediation;Patient/family education;Neuromuscular re-education;Gait training    PT Goals (Current goals can be found in the Care Plan section)  Acute Rehab PT Goals Patient Stated Goal: to reduce falls risk PT Goal Formulation: With patient Time For Goal Achievement: 01/18/22 Potential to Achieve Goals: Fair    Frequency Min 2X/week     Co-evaluation PT/OT/SLP Co-Evaluation/Treatment: Yes Reason for Co-Treatment: Complexity of the patient's impairments (multi-system involvement);Necessary to address cognition/behavior during functional activity;For patient/therapist safety PT goals addressed during session: Mobility/safety with mobility;Balance;Strengthening/ROM OT goals addressed during session: ADL's and self-care       AM-PAC PT "6 Clicks" Mobility  Outcome Measure Help needed turning from your back to your side while in a flat bed without using bedrails?: A Little Help needed moving from lying on your back to sitting on the  side of a flat bed without  using bedrails?: A Lot Help needed moving to and from a bed to a chair (including a wheelchair)?: Total Help needed standing up from a chair using your arms (e.g., wheelchair or bedside chair)?: A Lot Help needed to walk in hospital room?: Total Help needed climbing 3-5 steps with a railing? : Total 6 Click Score: 10    End of Session Equipment Utilized During Treatment: Oxygen Activity Tolerance: Treatment limited secondary to medical complications (Comment) Patient left: in bed;with call bell/phone within reach;with bed alarm set;with family/visitor present Nurse Communication: Mobility status PT Visit Diagnosis: Other abnormalities of gait and mobility (R26.89);Muscle weakness (generalized) (M62.81);Other symptoms and signs involving the nervous system (R29.898)    Time: 3557-3220 PT Time Calculation (min) (ACUTE ONLY): 30 min   Charges:   PT Evaluation $PT Eval Moderate Complexity: 1 Mod          Zenaida Niece, PT, DPT Acute Rehabilitation Office [phone]   Zenaida Niece 01/04/2022, 1:20 PM

## 2022-01-04 NOTE — Evaluation (Signed)
Clinical/Bedside Swallow Evaluation Patient Details  Name: Carly Larson MRN: [id number] Date of Birth: [birth year]  Today's Date: 01/04/2022 Time: SLP Start Time (ACUTE ONLY): 1101 SLP Stop Time (ACUTE ONLY): 1115 SLP Time Calculation (min) (ACUTE ONLY): 14 min  Past Medical History:  Past Medical History:  Diagnosis Date   Anxiety state, unspecified    Arthritis    Carpal tunnel syndrome    left   Chiari malformation type I (HCC)    Depression    Disorder of bone and cartilage, unspecified    Encounter for long-term (current) use of other medications    Esophageal reflux    Headache    High risk medication use    Insomnia    Memory loss    MMSE 24/30 on 12/05/11, 30/30 on 10/25/13   Memory loss    Menopausal symptoms    Osteopenia    Hx of vitamin D deficiency   Osteopenia    Symptomatic menopausal or female climacteric states    White matter abnormality on MRI of brain    Past Surgical History:  Past Surgical History:  Procedure Laterality Date   APPENDECTOMY     CARPAL TUNNEL RELEASE Left    CATARACT EXTRACTION, BILATERAL     COLONOSCOPY     LUMBAR FUSION  09/09/2018   HPI:  Pt is a 71 yo female found down after unwitnessed fall. Pt was somnolent upon arrival and intubated in the ED (ETT 6/12-6/14). CT Head showed small IVH, C-spine with nondisplaced T3 spinous process fx, CT Chest with atelectasis vs. PNA. PMH includes: dementia, GERD, HTN, depression/anxiety, insomnia, chiari 1 malformation, lumbar spondylolisthesis s/p lumbar fusion (L3-L4 08/2018). Per chart husband assists with medication administration.    Assessment / Plan / Recommendation  Clinical Impression  Pt is more alert than yesterday per RN, but mentation remains a barrier to PO diet at this time. She is still sleepy, although she awakens to stimulation, she is restless and not following commands. She has audible secretions and does not cough to command to try to clear them with yankauer. She does not open  her mouth much at all, pursing her lips and pushing the swab away when trying to view and clean oral cavity. Would remain NPO for now with ongoing SLP f/u to continue to assess swallowing. Will f/u for cognitive eval as well as alertness improves. SLP Visit Diagnosis: Dysphagia, unspecified (R13.10)    Aspiration Risk  Severe aspiration risk;Risk for inadequate nutrition/hydration    Diet Recommendation NPO   Medication Administration: Via alternative means    Other  Recommendations Oral Care Recommendations: Oral care QID    Recommendations for follow up therapy are one component of a multi-disciplinary discharge planning process, led by the attending physician.  Recommendations may be updated based on patient status, additional functional criteria and insurance authorization.  Follow up Recommendations Skilled nursing-short term rehab (<3 hours/day)      Assistance Recommended at Discharge Frequent or constant Supervision/Assistance  Functional Status Assessment Patient has had a recent decline in their functional status and demonstrates the ability to make significant improvements in function in a reasonable and predictable amount of time.  Frequency and Duration min 2x/week  2 weeks       Prognosis Prognosis for Safe Diet Advancement: Good Barriers to Reach Goals: Cognitive deficits      Swallow Study   General HPI: Pt is a 71 yo female found down after unwitnessed fall. Pt was somnolent upon arrival and intubated in  the ED (ETT 6/12-6/14). CT Head showed small IVH, C-spine with nondisplaced T3 spinous process fx, CT Chest with atelectasis vs. PNA. PMH includes: dementia, GERD, HTN, depression/anxiety, insomnia, chiari 1 malformation, lumbar spondylolisthesis s/p lumbar fusion (L3-L4 08/2018). Per chart husband assists with medication administration. Type of Study: Bedside Swallow Evaluation Previous Swallow Assessment: none in chart Diet Prior to this Study: NPO Temperature  Spikes Noted: Yes (101.2) Respiratory Status: Nasal cannula History of Recent Intubation: Yes Length of Intubations (days): 2 days Date extubated: 01/03/22 Behavior/Cognition: Lethargic/Drowsy;Doesn't follow directions Oral Cavity Assessment: Other (comment) (not able to observe) Oral Care Completed by SLP: Yes (as much as pt would allow) Oral Cavity - Dentition:  (difficultto fully assess - does have some dentition, appears to be missing some though?) Vision:  (mostly keeps eyes closed) Self-Feeding Abilities: Total assist Patient Positioning: Upright in bed Baseline Vocal Quality: Not observed Volitional Cough: Cognitively unable to elicit Volitional Swallow: Unable to elicit    Oral/Motor/Sensory Function Overall Oral Motor/Sensory Function: Other (comment) (cognitively unable to assess)   Ice Chips Ice chips: Not tested   Thin Liquid Thin Liquid: Not tested    Nectar Thick Nectar Thick Liquid: Not tested   Honey Thick Honey Thick Liquid: Not tested   Puree Puree: Not tested   Solid     Solid: Not tested      Osie Bond., M.A. [place] Office [phone]  Secure chat preferred  01/04/2022,11:23 AM

## 2022-01-04 NOTE — Progress Notes (Signed)
Pharmacy Antibiotic Note  Carly Larson is a 71 y.o. female admitted on 01/01/2022 with pneumonia.  Pharmacy has been consulted for Zosyn dosing. Previously on ceftriaxone but still with fevers. SCr stable WNL.  Plan: Zosyn 3.375g IV (74mn infusion) x1; then 3.375g IV q8h (4h infusion) Monitor clinical progress, c/s, renal function F/u de-escalation plan/LOT   Weight: 79.2 kg (174 lb 9.7 oz)  Temp (24hrs), Avg:99.4 F (37.4 C), Min:98.3 F (36.8 C), Max:101.2 F (38.4 C)  Recent Labs  Lab 01/01/22 1850 01/01/22 2332 01/02/22 0255 01/02/22 0819 01/02/22 1052 01/03/22 0109 01/04/22 0310  WBC 14.9*  --  15.7*  --   --   --  18.0*  CREATININE 1.17*  --  1.03*  --   --  0.96 0.79  LATICACIDVEN  --  2.3* 2.9* 1.4 1.5  --   --     CrCl cannot be calculated (Unknown ideal weight.).    No Known Allergies  HArturo Morton PharmD, BCPS Please check AMION for all MRavennacontact numbers Clinical Pharmacist 01/04/2022 10:15 AM

## 2022-01-04 NOTE — Progress Notes (Signed)
Initial Nutrition Assessment  DOCUMENTATION CODES:   Not applicable  INTERVENTION:   Once cortrak tube placed recommend  Osmolite 1.5 @ 40 ml/hr (960 ml/day) 45 ml ProSource TID  Provides: 1560 kcal, 93 grams protein, and 729 ml free water.    NUTRITION DIAGNOSIS:   Inadequate oral intake related to inability to eat as evidenced by NPO status.  GOAL:   Patient will meet greater than or equal to 90% of their needs  MONITOR:   I & O's  REASON FOR ASSESSMENT:   Rounds Enteral/tube feeding initiation and management  ASSESSMENT:   Pt with PMH of HTN, GERD, depression/anxiety, insomnia, dementia, chiari malformation, and lumbar spondylolisthesis s/p lumbar fusion 2020 now admitted after unwitnessed fall with traumatic IVH, and nondisplaced T3 spinous process fx.   Pt discussed during ICU rounds and with RN and MD.  Pt at high risk for re-intubation. Pt has nasal trumpet in place for suction. During visit noted pt coughing and tan liquid dripping from mouth which I cleaned up and notified RN. Palliative care following with meeting scheduled for 6/16.  Husband on the phone for duration of visit. Children not at bedside.  Chart reviewed. Admission weight 79.2 kg, last known wt from 2020 was 59 kg.   6/14 extubated 6/15 failed swallow with SLP; will not open mouth, not following commands  Medications reviewed and include: protonix, miralax, senokot-s  Labs reviewed:  CBG's: 80-145 (NPO)    NUTRITION - FOCUSED PHYSICAL EXAM:  Flowsheet Row Most Recent Value  Orbital Region No depletion  Upper Arm Region No depletion  Thoracic and Lumbar Region No depletion  Buccal Region No depletion  Temple Region No depletion  Clavicle Bone Region No depletion  Clavicle and Acromion Bone Region No depletion  Scapular Bone Region No depletion  Dorsal Hand No depletion  Patellar Region No depletion  Anterior Thigh Region No depletion  Posterior Calf Region No depletion  Edema  (RD Assessment) None  Hair Reviewed  Eyes Reviewed  Mouth Reviewed  Skin Reviewed  Nails Reviewed       Diet Order:   Diet Order             Diet NPO time specified  Diet effective now                   EDUCATION NEEDS:   Not appropriate for education at this time  Skin:  Skin Assessment:  (laceration R upper face)  Last BM:  unknown  Height:   Ht Readings from Last 1 Encounters:  02/03/19 5' (1.524 m)    Weight:   Wt Readings from Last 1 Encounters:  01/02/22 79.2 kg    BMI:  Body mass index is 34.1 kg/m.  Estimated Nutritional Needs:   Kcal:  1500-1700  Protein:  85-100 grams  Fluid:  > 1.5 L/day  Lockie Pares., RD, LDN, CNSC See AMiON for contact information

## 2022-01-04 NOTE — Evaluation (Signed)
Occupational Therapy Evaluation Patient Details Name: Carly Larson MRN: [id number] DOB: 05/16/1951 Today's Date: 01/04/2022   History of Present Illness Pt is a 71 yo female found down in [place].  Pt found to have small IVH, nondisplaced T3 spinous process fx. Pt with h/o dementia, L3-L4 fusion, chiari malformation, anxiety/depression.   Clinical Impression   Pt admitted with the above diagnosis and has the deficits outlined below. Pt would benefit from cont OT to increase independence with basic adls and adl transfers so she can return home with her husband. Pt has dementia and husband is the only caregiver who assists her with adls, IADLS, and driving/meds PTA.  Husband states she walked well PTA without an assistive device but requires a lot of assist with adls and constant cues due to dementia.  Husband states pt often gets into meds and takes too many of them.  Discussed options with husband to deter this from happening.   Pt is mobilizing well but with very heavy secretions therefore eval limited due to HR up 133 and O2 sats dropping to 85%.  Once suctioned, pt recovered to 93% and HR of 104.  Pt may need SNF rehab but mobilized so well, hoping once respiratory status improves, pt may be able to d/c home.  Will continue to reevaluate.      Recommendations for follow up therapy are one component of a multi-disciplinary discharge planning process, led by the attending physician.  Recommendations may be updated based on patient status, additional functional criteria and insurance authorization.   Follow Up Recommendations  Home health OT    Assistance Recommended at Discharge Frequent or constant Supervision/Assistance  Patient can return home with the following Other (comment);A lot of help with walking and/or transfers;A lot of help with bathing/dressing/bathroom;Assistance with cooking/housework;Direct supervision/assist for medications management;Direct supervision/assist for financial  management;Assist for transportation;Help with stairs or ramp for entrance (Pt cannot return home at this point due to medical issues.)    Functional Status Assessment  Patient has had a recent decline in their functional status and demonstrates the ability to make significant improvements in function in a reasonable and predictable amount of time.  Equipment Recommendations  None recommended by OT    Recommendations for Other Services       Precautions / Restrictions Precautions Precautions: Fall Restrictions Weight Bearing Restrictions: No      Mobility Bed Mobility Overal bed mobility: Needs Assistance Bed Mobility: Supine to Sit, Sit to Supine     Supine to sit: Mod assist Sit to supine: Total assist, +2 for physical assistance   General bed mobility comments: Pt limited getting back into bed due to fatigue and O2 sats dropping    Transfers Overall transfer level: Needs assistance Equipment used: 2 person hand held assist Transfers: Sit to/from Stand Sit to Stand: Mod assist, +2 physical assistance           General transfer comment: Pt able to get to standing but had difficulty maintaining standing and would rest back on bed due to limited height.      Balance Overall balance assessment: Needs assistance Sitting-balance support: Feet supported, Bilateral upper extremity supported Sitting balance-Leahy Scale: Poor     Standing balance support: During functional activity, Bilateral upper extremity supported Standing balance-Leahy Scale: Poor Standing balance comment: reliant out outside support to stand                           ADL either  performed or assessed with clinical judgement   ADL Overall ADL's : Needs assistance/impaired Eating/Feeding: NPO   Grooming: Wash/dry hands;Wash/dry face;Moderate assistance;Sitting Grooming Details (indicate cue type and reason): hand over hand assist Upper Body Bathing: Maximal assistance;Sitting    Lower Body Bathing: Maximal assistance;Sit to/from stand   Upper Body Dressing : Maximal assistance;Sitting   Lower Body Dressing: Maximal assistance;Sit to/from stand   Toilet Transfer: Maximal assistance;BSC/3in1;Stand-pivot   Toileting- Clothing Manipulation and Hygiene: Total assistance;Sit to/from stand       Functional mobility during ADLs: Moderate assistance;+2 for physical assistance General ADL Comments: Pt limited with all adls due to respiratory status. Pt's O2 sats dropped to 85%  and HR up to 133 during session due to secretions and needing to be suctioned.     Vision Patient Visual Report: No change from baseline Vision Assessment?:  (unsure on eval. To be further evaluated)     Perception     Praxis      Pertinent Vitals/Pain Pain Assessment Pain Assessment: Faces Faces Pain Scale: Hurts little more Pain Location: when suctionted Pain Descriptors / Indicators: Discomfort Pain Intervention(s): Limited activity within patient's tolerance, Monitored during session, Repositioned     Hand Dominance Right   Extremity/Trunk Assessment Upper Extremity Assessment Upper Extremity Assessment: Overall WFL for tasks assessed   Lower Extremity Assessment Lower Extremity Assessment: Defer to PT evaluation   Cervical / Trunk Assessment Cervical / Trunk Assessment: Other exceptions Cervical / Trunk Exceptions: T3 spinous process fx   Communication Communication Communication: No difficulties   Cognition Arousal/Alertness: Lethargic Behavior During Therapy: Anxious Overall Cognitive Status: Difficult to assess Area of Impairment: Awareness, Problem solving, Safety/judgement, Orientation, Attention, Memory                 Orientation Level:  (hard to assess) Current Attention Level: Sustained Memory: Decreased recall of precautions, Decreased short-term memory   Safety/Judgement: Decreased awareness of safety, Decreased awareness of deficits Awareness:  Intellectual Problem Solving: Slow processing General Comments: Very difficult to assess due to pt not communicating well. pt with heavy secretions limiting ability to participate.     General Comments  Pt with HR up to 133 and O2 sats down to 85% on full O2 during session. Nursing called to suction pt and pt eventually returned to 93% O2 and HR 104.    Exercises     Shoulder Instructions      Home Living Family/patient expects to be discharged to:: Private residence Living Arrangements: Spouse/significant other Available Help at Discharge: Family;Available 24 hours/day Type of Home: House Home Access: Level entry     Home Layout: One level     Bathroom Shower/Tub: Teacher, early years/pre: Standard     Home Equipment: Shower seat          Prior Functioning/Environment Prior Level of Function : Needs assist  Cognitive Assist : Mobility (cognitive);ADLs (cognitive) Mobility (Cognitive): Set up cues ADLs (Cognitive): Intermittent cues Physical Assist : ADLs (physical)   ADLs (physical): Toileting;IADLs;Dressing;Grooming Mobility Comments: Pt does not use assistive device. Has fallen twice in last year. ADLs Comments: Husband gives cues due to dementia.  Pt can feed self and toilet with only cues. Husband assists with bathing, dressing, grooming and all IADLS. Pt cannot be left alone. Pt's husband states the two times she has fallen she has gotten into meds and taken too many when he did not see.  Husband does all care for pt and is noticibly exhausted.  OT Problem List: Decreased activity tolerance;Impaired balance (sitting and/or standing);Decreased cognition;Decreased safety awareness;Decreased knowledge of use of DME or AE;Pain;Cardiopulmonary status limiting activity      OT Treatment/Interventions: Self-care/ADL training;Therapeutic activities;DME and/or AE instruction;Balance training    OT Goals(Current goals can be found in the care plan  section) Acute Rehab OT Goals Patient Stated Goal: none stated OT Goal Formulation: With patient/family Time For Goal Achievement: 01/18/22 Potential to Achieve Goals: Fair ADL Goals Pt Will Perform Eating: with set-up;sitting Pt Will Perform Grooming: standing;with min guard assist Pt Will Transfer to Toilet: with min guard assist;ambulating;regular height toilet Pt Will Perform Toileting - Clothing Manipulation and hygiene: with min guard assist;sit to/from stand Pt Will Perform Tub/Shower Transfer: with min guard assist;Shower transfer;ambulating  OT Frequency: Min 2X/week    Co-evaluation PT/OT/SLP Co-Evaluation/Treatment: Yes Reason for Co-Treatment: Complexity of the patient's impairments (multi-system involvement) PT goals addressed during session: Mobility/safety with mobility OT goals addressed during session: ADL's and self-care      AM-PAC OT "6 Clicks" Daily Activity     Outcome Measure Help from another person eating meals?: Total Help from another person taking care of personal grooming?: A Lot Help from another person toileting, which includes using toliet, bedpan, or urinal?: Total Help from another person bathing (including washing, rinsing, drying)?: Total Help from another person to put on and taking off regular upper body clothing?: Total Help from another person to put on and taking off regular lower body clothing?: Total 6 Click Score: 7   End of Session Equipment Utilized During Treatment: Oxygen Nurse Communication: Mobility status;Other (comment) (need for suction)  Activity Tolerance: Treatment limited secondary to agitation Patient left: in bed;with call bell/phone within reach;with bed alarm set;with family/visitor present  OT Visit Diagnosis: Other symptoms and signs involving the nervous system (L79.892)                Time: 1194-1740 OT Time Calculation (min): 32 min Charges:  OT General Charges $OT Visit: 1 Visit OT Evaluation $OT Eval  Moderate Complexity: 1 Mod  Glenford Peers 01/04/2022, 11:05 AM

## 2022-01-04 NOTE — Progress Notes (Signed)
Pt. Having difficulties clearing secretions, RT Nasal suctioned,  pt. tolerated well, RT will continue to monitor.

## 2022-01-05 ENCOUNTER — Inpatient Hospital Stay (HOSPITAL_COMMUNITY): Payer: Medicare HMO

## 2022-01-05 DIAGNOSIS — Z515 Encounter for palliative care: Secondary | ICD-10-CM | POA: Diagnosis not present

## 2022-01-05 DIAGNOSIS — F03911 Unspecified dementia, unspecified severity, with agitation: Secondary | ICD-10-CM | POA: Diagnosis not present

## 2022-01-05 DIAGNOSIS — J690 Pneumonitis due to inhalation of food and vomit: Secondary | ICD-10-CM | POA: Diagnosis not present

## 2022-01-05 DIAGNOSIS — I615 Nontraumatic intracerebral hemorrhage, intraventricular: Secondary | ICD-10-CM | POA: Diagnosis not present

## 2022-01-05 DIAGNOSIS — Z7189 Other specified counseling: Secondary | ICD-10-CM | POA: Diagnosis not present

## 2022-01-05 DIAGNOSIS — Z66 Do not resuscitate: Secondary | ICD-10-CM

## 2022-01-05 DIAGNOSIS — J9601 Acute respiratory failure with hypoxia: Secondary | ICD-10-CM | POA: Diagnosis not present

## 2022-01-05 LAB — CBC WITH DIFFERENTIAL/PLATELET
Abs Immature Granulocytes: 0.07 10*3/uL (ref 0.00–0.07)
Basophils Absolute: 0.1 10*3/uL (ref 0.0–0.1)
Basophils Relative: 0 %
Eosinophils Absolute: 0.1 10*3/uL (ref 0.0–0.5)
Eosinophils Relative: 0 %
HCT: 37.9 % (ref 36.0–46.0)
Hemoglobin: 12.5 g/dL (ref 12.0–15.0)
Immature Granulocytes: 1 %
Lymphocytes Relative: 11 %
Lymphs Abs: 1.5 10*3/uL (ref 0.7–4.0)
MCH: 30.8 pg (ref 26.0–34.0)
MCHC: 33 g/dL (ref 30.0–36.0)
MCV: 93.3 fL (ref 80.0–100.0)
Monocytes Absolute: 1.1 10*3/uL — ABNORMAL HIGH (ref 0.1–1.0)
Monocytes Relative: 8 %
Neutro Abs: 10.8 10*3/uL — ABNORMAL HIGH (ref 1.7–7.7)
Neutrophils Relative %: 80 %
Platelets: 262 10*3/uL (ref 150–400)
RBC: 4.06 MIL/uL (ref 3.87–5.11)
RDW: 13.8 % (ref 11.5–15.5)
WBC: 13.7 10*3/uL — ABNORMAL HIGH (ref 4.0–10.5)
nRBC: 0 % (ref 0.0–0.2)

## 2022-01-05 LAB — BASIC METABOLIC PANEL
Anion gap: 10 (ref 5–15)
BUN: 6 mg/dL — ABNORMAL LOW (ref 8–23)
CO2: 21 mmol/L — ABNORMAL LOW (ref 22–32)
Calcium: 8.3 mg/dL — ABNORMAL LOW (ref 8.9–10.3)
Chloride: 108 mmol/L (ref 98–111)
Creatinine, Ser: 0.84 mg/dL (ref 0.44–1.00)
GFR, Estimated: >60 mL/min (ref >60)
Glucose, Bld: 87 mg/dL (ref 70–99)
Potassium: 3.7 mmol/L (ref 3.5–5.1)
Sodium: 139 mmol/L (ref 135–145)

## 2022-01-05 LAB — MAGNESIUM: Magnesium: 2.3 mg/dL (ref 1.7–2.4)

## 2022-01-05 LAB — GLUCOSE, CAPILLARY
Glucose-Capillary: 97 mg/dL (ref 70–99)
Glucose-Capillary: 146 mg/dL — ABNORMAL HIGH (ref 70–99)

## 2022-01-05 LAB — PHOSPHORUS: Phosphorus: 2.3 mg/dL — ABNORMAL LOW (ref 2.5–4.6)

## 2022-01-05 MED ORDER — PROSOURCE TF PO LIQD: 45.0000 mL | Freq: Two times a day (BID) | ORAL | Status: DC

## 2022-01-05 MED ORDER — POTASSIUM CHLORIDE 10 MEQ/100ML IV SOLN
10.0000 meq | INTRAVENOUS | Status: AC
  Administered 2022-01-05 (×2): 10 meq via INTRAVENOUS
  Filled 2022-01-05: qty 100

## 2022-01-05 MED ORDER — PANTOPRAZOLE 2 MG/ML SUSPENSION
40.0000 mg | Freq: Every day | ORAL | Status: DC
  Administered 2022-01-06 – 2022-01-07 (×2): 40 mg
  Filled 2022-01-05 (×2): qty 20

## 2022-01-05 MED ORDER — SENNOSIDES-DOCUSATE SODIUM 8.6-50 MG PO TABS
1.0000 | ORAL_TABLET | Freq: Two times a day (BID) | ORAL | Status: DC
  Administered 2022-01-05: 1
  Filled 2022-01-05: qty 1

## 2022-01-05 MED ORDER — MEMANTINE HCL 10 MG PO TABS
10.0000 mg | ORAL_TABLET | Freq: Two times a day (BID) | ORAL | Status: DC
Start: 2022-01-05 — End: 2022-01-07
  Administered 2022-01-05 – 2022-01-07 (×5): 10 mg
  Filled 2022-01-05 (×6): qty 1

## 2022-01-05 MED ORDER — ACETAMINOPHEN 325 MG PO TABS
650.0000 mg | ORAL_TABLET | Freq: Four times a day (QID) | ORAL | Status: DC | PRN
Start: 2022-01-05 — End: 2022-01-08
  Administered 2022-01-08: 650 mg
  Filled 2022-01-05: qty 2

## 2022-01-05 MED ORDER — OLANZAPINE 10 MG IM SOLR
10.0000 mg | Freq: Four times a day (QID) | INTRAMUSCULAR | Status: DC | PRN
Start: 2022-01-05 — End: 2022-01-06
  Administered 2022-01-05: 10 mg via INTRAMUSCULAR
  Filled 2022-01-05 (×3): qty 10

## 2022-01-05 MED ORDER — OSMOLITE 1.5 CAL PO LIQD
1000.0000 mL | ORAL | Status: DC
Start: 2022-01-05 — End: 2022-01-08
  Administered 2022-01-05 – 2022-01-07 (×3): 1000 mL

## 2022-01-05 MED ORDER — VENLAFAXINE HCL 37.5 MG PO TABS
75.0000 mg | ORAL_TABLET | Freq: Two times a day (BID) | ORAL | Status: DC
Start: 2022-01-05 — End: 2022-01-08
  Administered 2022-01-05 – 2022-01-07 (×5): 75 mg
  Filled 2022-01-05: qty 2
  Filled 2022-01-05 (×2): qty 1
  Filled 2022-01-05 (×3): qty 2

## 2022-01-05 MED ORDER — POLYETHYLENE GLYCOL 3350 17 G PO PACK: 17.0000 g | PACK | Freq: Every day | ORAL | Status: DC

## 2022-01-05 MED ORDER — PROSOURCE TF PO LIQD
45.0000 mL | Freq: Three times a day (TID) | ORAL | Status: DC
  Administered 2022-01-05 – 2022-01-07 (×8): 45 mL
  Filled 2022-01-05 (×8): qty 45

## 2022-01-05 MED ORDER — DEXTROSE 5 % IV SOLN
15.0000 mmol | Freq: Once | INTRAVENOUS | Status: AC
  Administered 2022-01-05: 15 mmol via INTRAVENOUS
  Filled 2022-01-05: qty 5

## 2022-01-05 MED ORDER — TEMAZEPAM 7.5 MG PO CAPS
15.0000 mg | ORAL_CAPSULE | Freq: Every day | ORAL | Status: DC
Start: 2022-01-05 — End: 2022-01-07
  Administered 2022-01-05 – 2022-01-06 (×2): 15 mg
  Filled 2022-01-05 (×2): qty 2

## 2022-01-05 MED ORDER — LOSARTAN POTASSIUM 50 MG PO TABS
50.0000 mg | ORAL_TABLET | Freq: Every day | ORAL | Status: DC
Start: 2022-01-05 — End: 2022-01-08
  Administered 2022-01-05 – 2022-01-07 (×3): 50 mg
  Filled 2022-01-05 (×3): qty 1

## 2022-01-05 NOTE — Procedures (Signed)
Cortrak  Person Inserting Tube:  Veora Fonte C, RD Tube Type:  Cortrak - 43 inches Tube Size:  10 Tube Location:  Left nare Secured by: Bridle Technique Used to Measure Tube Placement:  Marking at nare/corner of mouth Cortrak Secured At:  66 cm   Cortrak Tube Team Note:  Consult received to place a Cortrak feeding tube.   X-ray is required, abdominal x-ray has been ordered by the Cortrak team. Please confirm tube placement before using the Cortrak tube.   If the tube becomes dislodged please keep the tube and contact the Cortrak team at [email or web address] (password [email or web address]) for replacement.  If after hours and replacement cannot be delayed, place a NG tube and confirm placement with an abdominal x-ray.    Beverlie Kurihara P., RD, LDN, CNSC See AMiON for contact information    

## 2022-01-05 NOTE — Progress Notes (Signed)
Nutrition Follow-up  DOCUMENTATION CODES:   Not applicable  INTERVENTION:  Initiate tube feeding via Cortrak (tip in pyloric region): Start Osmolite 1.5 at 40 ml/h (960 ml per day) Prosource TF 45 ml TID  Provides 1560 kcal, 93 gm protein, 729 ml free water daily  NUTRITION DIAGNOSIS:   Inadequate oral intake related to inability to eat as evidenced by NPO status.  Ongoing  GOAL:   Patient will meet greater than or equal to 90% of their needs  Goal not met-addressing needs via initiation of enteral nutrition  MONITOR:   I & O's  REASON FOR ASSESSMENT:   Rounds Enteral/tube feeding initiation and management  ASSESSMENT:   Pt with PMH of HTN, GERD, depression/anxiety, insomnia, dementia, chiari malformation, and lumbar spondylolisthesis s/p lumbar fusion 2020 now admitted after unwitnessed fall with traumatic IVH, and nondisplaced T3 spinous process fx.  6/14 extubated 6/15 failed swallow with SLP; will not open mouth, not following commands 6/16 failed swallow with SLP; resistant to oral stimulation; hold PO until better management of secretions and improved cognitive status; Cortrak placed (tip in pyloric region)  Spoke with MD, received verbal with readback to begin tube feeding  PMT family meeting planned this afternoon.   Nasal trumpet still in place to suction PRN, plans to keep one more day.  Edema: RUE non-pitting  Medications: protonix, miralax, senna IV drips: NaCl @ 90m/hr, zosyn, KPhos  Labs: sodium 139 (wnl), potassium 3.7 (wnl), BUN 6, phos 2.3 (L)  Diet Order:   Diet Order             Diet NPO time specified  Diet effective now                   EDUCATION NEEDS:   Not appropriate for education at this time  Skin:  Skin Assessment: Skin Integrity Issues: Skin Integrity Issues:: Other (Comment) Other: laceration R upper face  Last BM:  6/16 (type 6)  Height:   Ht Readings from Last 1 Encounters:  02/03/19 5' (1.524 m)     Weight:   Wt Readings from Last 1 Encounters:  01/02/22 79.2 kg    Ideal Body Weight:  45.5 kg  BMI:  Body mass index is 34.1 kg/m.  Estimated Nutritional Needs:   Kcal:  1500-1700  Protein:  85-100 grams  Fluid:  > 1.5 L/day  AClayborne Dana RDN, LDN Clinical Nutrition

## 2022-01-05 NOTE — Progress Notes (Signed)
Physical Therapy Treatment Patient Details Name: Carly Larson MRN: [id number] DOB: 06/19/1951 Today's Date: 01/05/2022   History of Present Illness 71 y.o. female presents to [facility] on 01/01/2022 after fall, with AMS. Pt required intubation due to hypercarbria on 6/12. CT head shows small IVH with T3 spinous process fx. Pt extubated 6/14.PMH includes anxiety, chiari malformation, depression, osteopenia, dementia.    PT Comments    PT session limited by impairments in cognition and communication. Pt with poor awareness of safety and deficits, unable to consistently follow commands at this time. Pt impulsively stands multiple times during session and requires physical assistance to maintain safety. Pt unable to side step at edge of bed, needing totalA to slide lower extremities currently. Pt is at a high risk for falls at this time. Pt's family express a desire to return home with HHPT and assistance from family. Pt would need constant supervision due to poor awareness of deficits and high falls risk. PT discusses with family the benefit of a hospital bed with rails along with use of a wheelchair, avoiding ambulation due to high falls risk.   Recommendations for follow up therapy are one component of a multi-disciplinary discharge planning process, led by the attending physician.  Recommendations may be updated based on patient status, additional functional criteria and insurance authorization.  Follow Up Recommendations  Skilled nursing-short term rehab (<3 hours/day) (family appear to prefer discharge home with HHPT)     Assistance Recommended at Discharge Frequent or constant Supervision/Assistance  Patient can return home with the following Two people to help with walking and/or transfers;Two people to help with bathing/dressing/bathroom;Assistance with feeding;Assistance with cooking/housework;Direct supervision/assist for medications management;Direct supervision/assist for financial  management;Assist for transportation;Help with stairs or ramp for entrance   Equipment Recommendations  Hospital bed    Recommendations for Other Services       Precautions / Restrictions Precautions Precautions: Fall Precaution Comments: monitor SpO2 Restrictions Weight Bearing Restrictions: No     Mobility  Bed Mobility Overal bed mobility: Needs Assistance Bed Mobility: Supine to Sit, Sit to Supine     Supine to sit: Min assist, HOB elevated Sit to supine: Total assist   General bed mobility comments: totalA to return to bed for safety reasons, pt repeatedly impulsively standing at edge of bed    Transfers Overall transfer level: Needs assistance Equipment used: 1 person hand held assist Transfers: Sit to/from Stand Sit to Stand: Mod assist           General transfer comment: pt often with posterior lean against bed, difficult to assess true transfer quality due to support of bed posteriorly. Pt not following commands well to sequence transfes of mobility    Ambulation/Gait             Pre-gait activities: pt remains unable to side step despite PT tactile assist for weight shift at trunk. PT sliding feet to left side at edge of bed in order to reach head of bed prior to return to supine     Stairs             Wheelchair Mobility    Modified Rankin (Stroke Patients Only)       Balance Overall balance assessment: Needs assistance Sitting-balance support: No upper extremity supported, Feet supported Sitting balance-Leahy Scale: Poor Sitting balance - Comments: minG-minA, impulsively standing multiple times   Standing balance support: Bilateral upper extremity supported, Single extremity supported Standing balance-Leahy Scale: Poor Standing balance comment: minA, mild posterior lean against  bed                            Cognition Arousal/Alertness: Awake/alert Behavior During Therapy: Impulsive, Restless Overall Cognitive  Status: History of cognitive impairments - at baseline Area of Impairment: Awareness, Problem solving, Safety/judgement, Following commands                       Following Commands: Follows multi-step commands inconsistently Safety/Judgement: Decreased awareness of safety, Decreased awareness of deficits Awareness: Intellectual Problem Solving: Slow processing, Difficulty sequencing, Requires verbal cues, Requires tactile cues General Comments: difficult to assess due to communication deficits        Exercises      General Comments General comments (skin integrity, edema, etc.): pt tachycardi to 146 observed, SpO2 stable in mid to high 90s on room air      Pertinent Vitals/Pain Pain Assessment Pain Assessment: Faces Faces Pain Scale: No hurt    Home Living                          Prior Function            PT Goals (current goals can now be found in the care plan section) Acute Rehab PT Goals Patient Stated Goal: to reduce falls risk Progress towards PT goals: Not progressing toward goals - comment (limited by communication and cognition)    Frequency    Min 3X/week      PT Plan Frequency needs to be updated (likely returning home per family)    Co-evaluation              AM-PAC PT "6 Clicks" Mobility   Outcome Measure  Help needed turning from your back to your side while in a flat bed without using bedrails?: A Little Help needed moving from lying on your back to sitting on the side of a flat bed without using bedrails?: A Little Help needed moving to and from a bed to a chair (including a wheelchair)?: Total Help needed standing up from a chair using your arms (e.g., wheelchair or bedside chair)?: A Lot Help needed to walk in hospital room?: Total Help needed climbing 3-5 steps with a railing? : Total 6 Click Score: 11    End of Session   Activity Tolerance: Other (comment) (limited by impaired cognition and communication,  impaired command following and safety awareness) Patient left: in bed;with call bell/phone within reach;with bed alarm set;with restraints reapplied Nurse Communication: Mobility status PT Visit Diagnosis: Other abnormalities of gait and mobility (R26.89);Muscle weakness (generalized) (M62.81);Other symptoms and signs involving the nervous system (R29.898)     Time: 3382-5053 PT Time Calculation (min) (ACUTE ONLY): 24 min  Charges:  $Therapeutic Activity: 23-37 mins                     Zenaida Niece, PT, DPT Acute Rehabilitation Office [place] Athleen Feltner 01/05/2022, 4:32 PM

## 2022-01-05 NOTE — Progress Notes (Signed)
NAME:  Carly Larson, MRN:  [id number], DOB:  1951/03/11, LOS: 4 ADMISSION DATE:  01/01/2022 CONSULTATION DATE:  01/01/2022 REFERRING MD:  Regenia Skeeter - EDP CHIEF COMPLAINT:  AMS, s/p unwitnessed fall  History of Present Illness:  71 year old woman who presented to [facility] ED 6/12 after she was found down by her husband in the [place], s/p unwitnessed fall. PMHx significant for HTN, GERD, depression/anxiety, insomnia, dementia, Chiari 1 malformation, lumbar spondylolisthesis s/p lumbar fusion (L3-L4 08/2018).  History obtained from chart, as husband/family not available and patient is intubated/sedated. Per report, patient was more somnolent than usual prior to admission; she has dementia at baseline but is interactive/functional with assistance and confusion at times waxes/wanes. Husband assists her with ADLs including medication administration; she did not receive any additional doses. Reports that he found patient down in the [place] after a fall. Patient sustained a R lateral forehead laceration (repaired by EDP).   On ED arrival, patient was somnolent but was per report answering simple questions with EMS en route. Intubated for airway protection in the setting of somnolence/level 1 trauma. Vitals on arrival to ED demonstrated temp 97.6, tachycardia to 130s, mild hypertension, RR 25 and O2 sat 100%. Labs demonstrated mild leukocytosis with WBC 14.9, mild hyponatremia to 132, mild AKI with Cr 1.1 (baseline 0.8), normal INR 1.0. LA/PCT and BCx/UCx sent. Head/Neck imaging (CT Head, CT Maxillofacial, CT C-spine) demonstrated small acute IVH and nondisplaced fracture of T3 spinous process, otherwise negative. CT Chest/A/P demonstrated bilateral lower lobe/posteropr subpleural patchy subsegmental densities, ?atelectasis vs. pulmonary contusion vs PNA, otherwise negative for acute pathology. Trauma Surgery was consulted and recommended ICU admission. NSGY consulted for IVH and T3 spinous process fracture.  PCCM  consulted for ICU admission and management.  Pertinent Medical History:   Past Medical History:  Diagnosis Date   Anxiety state, unspecified    Arthritis    Carpal tunnel syndrome    left   Chiari malformation type I (HCC)    Depression    Disorder of bone and cartilage, unspecified    Encounter for long-term (current) use of other medications    Esophageal reflux    Headache    High risk medication use    Insomnia    Memory loss    MMSE 24/30 on 12/05/11, 30/30 on 10/25/13   Memory loss    Menopausal symptoms    Osteopenia    Hx of vitamin D deficiency   Osteopenia    Symptomatic menopausal or female climacteric states    White matter abnormality on MRI of brain    Significant Hospital Events: Including procedures, antibiotic start and stop dates in addition to other pertinent events   6/12 - Found [place] husband [place]. EMS called and brought patient to [facility] as a Level 1 trauma. Answering simple questions with EMS. Intubated on arrival to ED for somnolence, trauma concerns. CT Head with acute small IVH, C-spine with nondisplaced T3 spinous process fx, CT Chest with atelectasis vs. PNA, no acute A/P pathology. R forehead lac repaired by EDP. Trauma/NSGY consulted. PCCM consulted for ICU admission.  Interim History / Subjective:  Agitated overnight, received zyprexa. Tmax 99.4.  Objective:  Blood pressure (!) 150/73, pulse 97, temperature 98.7 F (37.1 C), temperature source Axillary, resp. rate 19, weight 79.2 kg, SpO2 100 %.        Intake/Output Summary (Last 24 hours) at 01/05/2022 0841 Last data filed at 01/05/2022 0800 Gross per 24 hour  Intake 1497.14 ml  Output  1800 ml  Net -302.86 ml    Filed Weights   01/02/22 0120  Weight: 79.2 kg    Physical Examination: General: ill appearing woman lying in bed in NAD, in posey vest HEENT: abrasion to right forehead bandaged, bruising around R eye stable Neuro: sleepy but more alert today, able to say  phrases spontaneously, not really answering questions though. Said "help me" after NTS, said her husband's name. Moving all extremities on command, nodding to y/n questions. CV: S1S2, mild tachycardia PULM: rhonchi, still thick tan secretions, no accessory muscle use. Stronger cough today. GI: soft, NT Extremities: no cyanosis or clubbing.  Skin: warm, dry, no diffuse rashes  Resp culture> normal flora Blood culture> NGTD Na+ 139 K+ 3.7 BUN 6 Cr 0.84 WBC 13.7 H/H 12.5/37.9  Resolved Hospital Problem List:    Assessment & Plan:  Acute IVH due to fall Nondisplaced T3 spinous process fracture History of Chiari 1 malformation Dilated ventricles-- degenerative change a/w dementia but not related to [facility] per NS -no additional head imaging needed -prophylactic keppra x 7 days -con't PTA meds for dementia and agitation -stable today to transfer to progressive care since she remains more alert.  Acute encephalopathy- potentially post concussive plus SAH with baseline dementia. Now with agitated delirium-- may not be far off her baseline. -con't antibiotics to complete 7 days -olanzapine PRN for nonredirectable agitation -con't PTA meds- temazepam, mirtazapine, memantadine -needs cortrak; can try to repeat bedside swallow again today  Acute hypercarbic and hypoxic respiratory failure Atelectasis versus PNA, likely aspiration with bilateral lower low infiltrates on CT - NTS PRN; keep nasal trumpet another day -CPT Q4h -stable to transfer to progressive, still needs respiratory monitoring -wean supplemental O2 to maintain SpO2 >90%  Hyponatremia, resolved -monitor  AKI,  resolved -con't to monitor -renally dose meds, avoid nephrotoxic meds -maintain adequate renal perfusion  Lumbar spondylolisthesis s/p lumbar fusion S/p 1-level (L3-L4) fusion 08/2018, Dr. Louanne Skye. -PRN tylenol and fentanyl  History of HTN -can resume losartan once her cortrak is in -hydralazine  PRN  GERD -PPI  Depression Anxiety History of dementia, memory loss -Con't namenda, effexor, temazepam, & aricept at PTA doses- can resume today once cortrak in. -Appreciate Palliative care being involved- meeting this afternoon.  No family at bedside during rounds today- husband updated via phone yesterday.   TRH to assume care tomorrow.   Best Practice: (right click and "Reselect all SmartList Selections" daily)   Diet/type: NPO w/ meds via tube DVT prophylaxis: prophylactic heparin  GI prophylaxis: PPI Lines: N/A Foley:  N/A Code Status:  full code Last date of multidisciplinary goals of care discussion [6/13 ]  Labs:  CBC: Recent Labs  Lab 01/01/22 1850 01/01/22 1857 01/01/22 2037 01/02/22 0255 01/04/22 0310 01/05/22 0413  WBC 14.9*  --   --  15.7* 18.0* 13.7*  NEUTROABS  --   --   --   --  14.2* 10.8*  HGB 14.8 15.6* 12.6 13.2  11.6* 11.9* 12.5  HCT 45.4 46.0 37.0 40.4  34.0* 36.0 37.9  MCV 96.8  --   --  94.0 94.7 93.3  PLT 336  --   --  247 243 024    Basic Metabolic Panel: Recent Labs  Lab 01/01/22 1850 01/01/22 1857 01/01/22 2037 01/01/22 2327 01/02/22 0255 01/03/22 0109 01/04/22 0310 01/05/22 0413  NA 132*   < > 132*  --  137  137 135 134* 139  K 4.6   < > 4.5  --  4.7  3.9 3.9 3.9 3.7  CL 96*  --   --   --  100 103 103 108  CO2 27  --   --   --  '26 23 23 '$ 21*  GLUCOSE 129*  --   --   --  83 116* 96 87  BUN 7*  --   --   --  6* 10 8 6*  CREATININE 1.17*  --   --   --  1.03* 0.96 0.79 0.84  CALCIUM 9.1  --   --   --  8.8* 8.3* 8.1* 8.3*  MG  --   --   --  1.9 2.1 1.9 1.9 2.3  PHOS  --   --   --   --  3.2 2.6 3.8 2.3*   < > = values in this interval not displayed.    GFR: CrCl cannot be calculated (Unknown ideal weight.). Recent Labs  Lab 01/01/22 1850 01/01/22 2327 01/01/22 2332 01/02/22 0255 01/02/22 0819 01/02/22 1052 01/04/22 0310 01/05/22 0413  PROCALCITON  --  <0.10  --   --   --   --   --   --   WBC 14.9*  --   --   15.7*  --   --  18.0* 13.7*  LATICACIDVEN  --   --  2.3* 2.9* 1.4 1.5  --   --     Liver Function Tests: Recent Labs  Lab 01/01/22 1850 01/04/22 0310  AST 40 33  ALT 26 18  ALKPHOS 150* 97  BILITOT 0.7 0.4  PROT 7.4 5.3*  ALBUMIN 4.1 2.5*    No results for input(s): "LIPASE", "AMYLASE" in the last 168 hours. No results for input(s): "AMMONIA" in the last 168 hours.  ABG:    Component Value Date/Time   PHART 7.466 (H) 01/02/2022 0255   PCO2ART 36.4 01/02/2022 0255   PO2ART 81 (L) 01/02/2022 0255   HCO3 26.3 01/02/2022 0255   TCO2 27 01/02/2022 0255   ACIDBASEDEF 2.0 01/01/2022 1857   O2SAT 97 01/02/2022 Amanda Euleta Belson, DO 01/05/22 9:36 AM [facility]

## 2022-01-05 NOTE — Progress Notes (Signed)
Speech Language Pathology Treatment: Dysphagia  Patient Details Name: Carly Larson MRN: [id number] DOB: 1951/02/02 Today's Date: 01/05/2022 Time: 2979-8921 SLP Time Calculation (min) (ACUTE ONLY): 16 min  Assessment / Plan / Recommendation Clinical Impression  Pt is more alert today, although still a little resistant to oral stimulation. Husband was used as a therapeutic agent as she responds best to him. She allowed a little bit of oral care today but did not want to let much else into her mouth, getting very small amounts of water in via swab though. She does not swallow, but question if she really got enough to swallow. She has audible congestion and per RN, is still requiring NTS. Discussed with husband - would still hold on PO diet until she is better managing secretions particularly considering cognitive status. Will continue to follow.    HPI HPI: Pt is a 71 yo female found down after unwitnessed fall. Pt was somnolent upon arrival and intubated in the ED (ETT 6/12-6/14). CT Head showed small IVH, C-spine with nondisplaced T3 spinous process fx, CT Chest with atelectasis vs. PNA. PMH includes: dementia, GERD, HTN, depression/anxiety, insomnia, chiari 1 malformation, lumbar spondylolisthesis s/p lumbar fusion (L3-L4 08/2018). Per chart husband assists with medication administration.      SLP Plan  Continue with current plan of care      Recommendations for follow up therapy are one component of a multi-disciplinary discharge planning process, led by the attending physician.  Recommendations may be updated based on patient status, additional functional criteria and insurance authorization.    Recommendations  Diet recommendations: NPO Medication Administration: Via alternative means                Oral Care Recommendations: Oral care QID Follow Up Recommendations: Skilled nursing-short term rehab (<3 hours/day) Assistance recommended at discharge: Frequent or constant  Supervision/Assistance SLP Visit Diagnosis: Dysphagia, unspecified (R13.10) Plan: Continue with current plan of care           Osie Bond., M.A. [place] Office [phone]  Secure chat preferred   01/05/2022, 10:29 AM

## 2022-01-05 NOTE — Progress Notes (Signed)
Report given 3W RN Alma Friendly. Patient transferred with all belongings with family at bedside.

## 2022-01-05 NOTE — Consult Note (Signed)
Consultation Note Date: 01/05/2022   Patient Name: Carly Larson  DOB: 1951/02/03  MRN: [id number]  Age / Sex: 71 y.o., female  PCP: Cyndi Bender, PA-C Referring Physician: Julian Hy, DO  Reason for Consultation: Establishing goals of care  HPI/Patient Profile: 71 y.o. female  with past medical history of HTN, GERD, depression/anxiety, insomnia, dementia, Chiari 1 malformation, and lumbar spondylolisthesis s/p lumbar fusion (L3-L4 08/2018) admitted on 01/01/2022 with s/p unwitnessed fall.  Patient initially required intubation for airway protection but was extubated 6/14.  Patient was diagnosed with acute IVH due to fall.  Also found to have T3 spinous process fracture.  PMT consulted to discuss goals of care.  Clinical Assessment and Goals of Care: I have reviewed medical records including EPIC notes, labs and imaging, received report from RN and Dr. Carlis Abbott, assessed the patient and then met with patient's daughter [name] and spouse to discuss diagnosis prognosis, GOC, EOL wishes, disposition and options.  I introduced Palliative Medicine as specialized medical care for people living with serious illness. It focuses on providing relief from the symptoms and stress of a serious illness. The goal is to improve quality of life for both the patient and the family.  We discussed a brief life review of the patient.  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  They share that her cognitive decline started in her 37s.  Family shares patient has declined significantly over the years related to her dementia.  Most recently she hallucinates often and is typically disoriented.  She is dependent on others for her ADLs.  She is unable to hold a conversation.  She is incontinent of bowels and bladder.  We discussed that the events that  led to the hospitalization and throughout the hospitalization will likely lead to a new, worsened baseline.  Family agrees.   We discussed patient's current illness and what it means in the larger context of patient's on-going co-morbidities.  Natural disease trajectory and expectations at EOL were discussed.  We discussed that unfortunately patient's dementia is a chronic progressive disease.  Family expresses understanding and understand that her symptoms such as loss of bowel and bladder control and losing her speech signify moving to the later stages of dementia.  I attempted to elicit values and goals of care important to the patient.  Family shares it is most important for patient to be at home with her spouse.   Discussed with family the importance of continued conversation with family and the medical providers regarding overall plan of care and treatment options, ensuring decisions are within the context of the patients values and GOCs.    Hospice and Palliative Care services outpatient were explained and offered.  Patient is currently receiving outpatient palliative services and so family is aware of what this entails.  We discussed what kind of support hospice could provide in their philosophy of care.  Family relays that hospice philosophy is in line with their goals of care and they would appreciate the support of hospice at home.  We also discussed potential addition of private caregivers in the home as well.  Questions and concerns were addressed. The family was encouraged to call with questions or concerns.  Primary Decision Maker NEXT OF KIN -spouse however some concern of cognitive impairment, daughter assist with decision-making as well   SUMMARY OF RECOMMENDATIONS   Main goal is for patient  to return home with her husband -family interested in receiving support of hospice upon discharge -I have given referral to hospice of the [place] since they were already connected with a  palliative program Continue current measures for now to optimize in preparation for discharge home PMT will follow along  Standard delirium management (adapted from NICE guidelines 2011 for prevention of delirium):  Provide continuity of care when possible (avoid frequent changing of surroundings and staff).  Frequent reorientation to time with:  A clock should always be visible.  Make sure Calendar/white board is updated.  Lights on/blinds open during the day and off/closed at night.  Encourage frequent family visits.  Monitor for and treat dehydration/constipation.  Optimize oxygen saturation.  Avoid catheters and IV's when possible and look for/treat infections.  Encourage early mobility.  Assess and treat pain  Ensure adequate nutrition and functioning dentures.  Address reversible causes of hearing and visual impairment:  Use pocket talker if hearing aids are unavailable.  Avoid sleep disturbance (normalize sleep/wake cycle).  Minimize disturbances and consider NOT obtaining vitals at night if possible.  Review Medications to avoid polypharmacy and avoid deliriogenic medications when possible:  Benzodiazepines  Dihydropyridines.  Antihistamines.  Anticholinergics   (Possibly avoid: H2 blockers, tricyclic antidepressants, antiparkinson medications, steroids, NSAID's).   Code Status/Advance Care Planning: DNR  Symptom Management:  Per CCM  Discharge Planning: Home with Hospice      Primary Diagnoses: Present on Admission: **None**   I have reviewed the medical record, interviewed the patient and family, and examined the patient. The following aspects are pertinent.  Past Medical History:  Diagnosis Date   Anxiety state, unspecified    Arthritis    Carpal tunnel syndrome    left   Chiari malformation type I (HCC)    Depression    Disorder of bone and cartilage, unspecified    Encounter for long-term (current) use of other medications    Esophageal reflux     Headache    High risk medication use    Insomnia    Memory loss    MMSE 24/30 on 12/05/11, 30/30 on 10/25/13   Memory loss    Menopausal symptoms    Osteopenia    Hx of vitamin D deficiency   Osteopenia    Symptomatic menopausal or female climacteric states    White matter abnormality on MRI of brain    Social History   Socioeconomic History   Marital status: Married    Spouse name: Jeani Hawking    Number of children: 1   Years of education: 12+   Highest education level: Not on file  Occupational History   Occupation: Teacher, music: [employer]  Tobacco Use   Smoking status: Never   Smokeless tobacco: Never  Vaping Use   Vaping Use: Never used  Substance and Sexual Activity   Alcohol use: No    Alcohol/week: 0.0 standard drinks of alcohol   Drug use: No   Sexual activity: Not on file  Other Topics Concern   Not on file  Social History Narrative   Patient lives at home with husband. Jeani Hawking   Patient has 1 child.    Patient is left handed.    Patient is currently working    Patient has a college education    3 cups of coffee a day    Social Determinants of Radio broadcast assistant Strain: Not on file  Food Insecurity: Not on file  Transportation Needs: Not  on file  Physical Activity: Not on file  Stress: Not on file  Social Connections: Not on file   Family History  Problem Relation Age of Onset   Diabetes Mother    Osteoarthritis Mother    Hypertension Mother    Dementia Mother    Stroke Father    Heart attack Brother    CAD Brother    Breast cancer Maternal Aunt    Scheduled Meds:  Chlorhexidine Gluconate Cloth  6 each Topical Daily   feeding supplement (PROSource TF)  45 mL Per Tube TID   heparin injection (subcutaneous)  5,000 Units Subcutaneous Q8H   losartan  50 mg Per Tube Daily   memantine  10 mg Per Tube BID   [START ON 01/06/2022] pantoprazole sodium  40 mg Per Tube Daily   polyethylene glycol  17 g Per Tube Daily    senna-docusate  1 tablet Per Tube BID   temazepam  15 mg Per Tube QHS   venlafaxine  75 mg Per Tube BID WC   Continuous Infusions:  sodium chloride 10 mL/hr at 01/05/22 1400   feeding supplement (OSMOLITE 1.5 CAL) 1,000 mL (01/05/22 1508)   levETIRAcetam Stopped (01/05/22 0922)   piperacillin-tazobactam (ZOSYN)  IV 3.375 g (01/05/22 1508)   PRN Meds:.acetaminophen, acetaminophen, fentaNYL (SUBLIMAZE) injection, hydrALAZINE, OLANZapine No Known Allergies Review of Systems  Unable to perform ROS: Mental status change    Physical Exam Constitutional:      General: She is not in acute distress.    Appearance: She is ill-appearing.     Comments: Wakes to voice  Pulmonary:     Effort: Pulmonary effort is normal.  Skin:    General: Skin is warm and dry.  Neurological:     Mental Status: She is disoriented.  Psychiatric:        Behavior: Behavior is agitated.     Vital Signs: BP (!) 145/53   Pulse (!) 117   Temp 99 F (37.2 C) (Axillary)   Resp 19   Wt 79.2 kg   SpO2 100%   BMI 34.10 kg/m  Pain Scale: CPOT       SpO2: SpO2: 100 % O2 Device:SpO2: 100 % O2 Flow Rate: .O2 Flow Rate (L/min): (S) 4 L/min  IO: Intake/output summary:  Intake/Output Summary (Last 24 hours) at 01/05/2022 1641 Last data filed at 01/05/2022 1400 Gross per 24 hour  Intake 1085.53 ml  Output 2050 ml  Net -964.47 ml    LBM: Last BM Date : 01/05/22 Baseline Weight: Weight: 79.2 kg Most recent weight: Weight: 79.2 kg     Palliative Assessment/Data: PPS 40%   Flowsheet Rows    Flowsheet Row Most Recent Value  Intake Tab   Referral Department Critical care  Unit at Time of Referral ICU  Palliative Care Primary Diagnosis Neurology  Date Notified 01/03/22  Palliative Care Type New Palliative care  Reason for referral Clarify Goals of Care  Date of Admission 01/02/22  Date first seen by Palliative Care 01/03/22  # of days Palliative referral response time 0 Day(s)  # of days IP prior to  Palliative referral 1  Clinical Assessment   Psychosocial & Spiritual Assessment   Palliative Care Outcomes        *Please note that this is a verbal dictation therefore any spelling or grammatical errors are due to the "[place] [employer]" system interpretation.   Juel Burrow, DNP, AGNP-C Palliative Medicine Team [phone] Pager: [phone]

## 2022-01-05 NOTE — Evaluation (Signed)
Speech Language Pathology Evaluation Patient Details Name: Carly Larson MRN: [id number] DOB: [birth year] Today's Date: 01/05/2022 Time: 6962-9528 SLP Time Calculation (min) (ACUTE ONLY): 12 min  Problem List:  Patient Active Problem List   Diagnosis Date Noted   IVH (intraventricular hemorrhage) ([place]) 01/01/2022   Spondylolisthesis, lumbar region 09/10/2018    Class: Chronic   Spinal stenosis of lumbar region with neurogenic claudication 09/10/2018    Class: Chronic   Anemia due to acute blood loss 09/10/2018   Fusion of spine of lumbar region 09/09/2018   Dementia ([place]) 12/23/2017   Syncope 12/23/2017   Heart murmur 12/23/2017   Unilateral primary osteoarthritis, right knee 10/23/2017   Unilateral primary osteoarthritis, left knee 09/23/2017   Chronic pain of left knee 08/08/2017   Pain in right knee 08/08/2017   Sciatica, left side 08/08/2017   Memory loss 12/03/2013   Past Medical History:  Past Medical History:  Diagnosis Date   Anxiety state, unspecified    Arthritis    Carpal tunnel syndrome    left   Chiari malformation type I (HCC)    Depression    Disorder of bone and cartilage, unspecified    Encounter for long-term (current) use of other medications    Esophageal reflux    Headache    High risk medication use    Insomnia    Memory loss    MMSE 24/30 on 12/05/11, 30/30 on 10/25/13   Memory loss    Menopausal symptoms    Osteopenia    Hx of vitamin D deficiency   Osteopenia    Symptomatic menopausal or female climacteric states    White matter abnormality on MRI of brain    Past Surgical History:  Past Surgical History:  Procedure Laterality Date   APPENDECTOMY     CARPAL TUNNEL RELEASE Left    CATARACT EXTRACTION, BILATERAL     COLONOSCOPY     LUMBAR FUSION  09/09/2018   HPI:  Pt is a 71 yo female found down after unwitnessed fall. Pt was somnolent upon arrival and intubated in the ED (ETT 6/12-6/14). CT Head showed small IVH, C-spine with  nondisplaced T3 spinous process fx, CT Chest with atelectasis vs. PNA. PMH includes: dementia, GERD, HTN, depression/anxiety, insomnia, chiari 1 malformation, lumbar spondylolisthesis s/p lumbar fusion (L3-L4 08/2018). Per chart husband assists with medication administration.   Assessment / Plan / Recommendation Clinical Impression  Pt is more alert today and verbalizing a little more. Her husband was present and helpful in providing clarification about baseline level of function. He says that at home, the two tasks she typically participates in are feeding herself and brushing her teeth, but she does need some assistance. She will talk a little bit, but cannot remember what she has said for even immediate repetition. She can verbalize some simple needs (like stating she has to go to the bathroom) but needs full assist to address whatever she may need. Otherwise, he says she mostly sits and watches tv. This morning she is more alert, verbalizing (although confused), and participating a little in oral care with hand-over-hand assist. Her husband believes that she is very close to her cognitive and communicative baseline and is in agreement with SLP following for swallowing only at this time.    SLP Assessment  SLP Recommendation/Assessment: Patient does not need any further Speech Lanaguage Pathology Services SLP Visit Diagnosis: Cognitive communication deficit (R41.841)    Recommendations for follow up therapy are one component of a multi-disciplinary discharge planning process,  led by the attending physician.  Recommendations may be updated based on patient status, additional functional criteria and insurance authorization.    Follow Up Recommendations  Skilled nursing-short term rehab (<3 hours/day)    Assistance Recommended at Discharge  Frequent or constant Supervision/Assistance  Functional Status Assessment Patient has not had a recent decline in their functional status  Frequency and Duration            SLP Evaluation Cognition  Overall Cognitive Status: History of cognitive impairments - at baseline       Comprehension  Auditory Comprehension Overall Auditory Comprehension: Impaired at baseline    Expression Expression Primary Mode of Expression: Verbal Verbal Expression Overall Verbal Expression: Impaired at baseline   Oral / Motor  Motor Speech Overall Motor Speech: Appears within functional limits for tasks assessed            Osie Bond., M.A. [place] Office [phone]  Secure chat preferred  01/05/2022, 10:39 AM

## 2022-01-05 NOTE — Progress Notes (Signed)
[facility] ADULT ICU REPLACEMENT PROTOCOL   The patient does apply for the [facility] Adult ICU Electrolyte Replacment Protocol based on the criteria listed below:   1.Exclusion criteria: TCTS patients, ECMO patients, and Dialysis patients 2. Is GFR >/= 30 ml/min? Yes.    Patient's GFR today is >60 3. Is SCr </= 2? Yes.   Patient's SCr is 0.84 mg/dL 4. Did SCr increase >/= 0.5 in 24 hours? No. 5.Pt's weight >40kg  Yes.   6. Abnormal electrolyte(s): K+ 3.7, phos 2.3  7. Electrolytes replaced per protocol 8.  Call MD STAT for K+ </= 2.5, Phos </= 1, or Mag </= 1 Physician:  n/a  Carly Larson 01/05/2022 5:41 AM

## 2022-01-06 DIAGNOSIS — N179 Acute kidney failure, unspecified: Secondary | ICD-10-CM

## 2022-01-06 DIAGNOSIS — R451 Restlessness and agitation: Secondary | ICD-10-CM

## 2022-01-06 DIAGNOSIS — R410 Disorientation, unspecified: Secondary | ICD-10-CM

## 2022-01-06 DIAGNOSIS — W19XXXA Unspecified fall, initial encounter: Secondary | ICD-10-CM | POA: Diagnosis not present

## 2022-01-06 DIAGNOSIS — Z66 Do not resuscitate: Secondary | ICD-10-CM

## 2022-01-06 DIAGNOSIS — F02C11 Dementia in other diseases classified elsewhere, severe, with agitation: Secondary | ICD-10-CM

## 2022-01-06 DIAGNOSIS — I10 Essential (primary) hypertension: Secondary | ICD-10-CM

## 2022-01-06 DIAGNOSIS — G3183 Dementia with Lewy bodies: Secondary | ICD-10-CM

## 2022-01-06 DIAGNOSIS — I615 Nontraumatic intracerebral hemorrhage, intraventricular: Secondary | ICD-10-CM | POA: Diagnosis not present

## 2022-01-06 DIAGNOSIS — G9341 Metabolic encephalopathy: Secondary | ICD-10-CM

## 2022-01-06 DIAGNOSIS — J9601 Acute respiratory failure with hypoxia: Secondary | ICD-10-CM

## 2022-01-06 DIAGNOSIS — S22008A Other fracture of unspecified thoracic vertebra, initial encounter for closed fracture: Secondary | ICD-10-CM

## 2022-01-06 DIAGNOSIS — E876 Hypokalemia: Secondary | ICD-10-CM

## 2022-01-06 DIAGNOSIS — Z7189 Other specified counseling: Secondary | ICD-10-CM | POA: Diagnosis not present

## 2022-01-06 DIAGNOSIS — G935 Compression of brain: Secondary | ICD-10-CM

## 2022-01-06 LAB — CULTURE, BLOOD (ROUTINE X 2): Culture: NO GROWTH

## 2022-01-06 LAB — GLUCOSE, CAPILLARY
Glucose-Capillary: 108 mg/dL — ABNORMAL HIGH (ref 70–99)
Glucose-Capillary: 108 mg/dL — ABNORMAL HIGH (ref 70–99)
Glucose-Capillary: 113 mg/dL — ABNORMAL HIGH (ref 70–99)
Glucose-Capillary: 115 mg/dL — ABNORMAL HIGH (ref 70–99)
Glucose-Capillary: 117 mg/dL — ABNORMAL HIGH (ref 70–99)
Glucose-Capillary: 119 mg/dL — ABNORMAL HIGH (ref 70–99)
Glucose-Capillary: 85 mg/dL (ref 70–99)

## 2022-01-06 LAB — BASIC METABOLIC PANEL
Anion gap: 11 (ref 5–15)
BUN: 9 mg/dL (ref 8–23)
CO2: 23 mmol/L (ref 22–32)
Calcium: 8.7 mg/dL — ABNORMAL LOW (ref 8.9–10.3)
Chloride: 105 mmol/L (ref 98–111)
Creatinine, Ser: 0.82 mg/dL (ref 0.44–1.00)
GFR, Estimated: >60 mL/min (ref >60)
Glucose, Bld: 136 mg/dL — ABNORMAL HIGH (ref 70–99)
Potassium: 3.2 mmol/L — ABNORMAL LOW (ref 3.5–5.1)
Sodium: 139 mmol/L (ref 135–145)

## 2022-01-06 LAB — PHOSPHORUS: Phosphorus: 2.4 mg/dL — ABNORMAL LOW (ref 2.5–4.6)

## 2022-01-06 LAB — MAGNESIUM: Magnesium: 2.2 mg/dL (ref 1.7–2.4)

## 2022-01-06 MED ORDER — ORAL CARE MOUTH RINSE: 15.0000 mL | OROMUCOSAL | Status: DC | PRN

## 2022-01-06 MED ORDER — POTASSIUM CHLORIDE 10 MEQ/100ML IV SOLN: 10.0000 meq | INTRAVENOUS | Status: DC

## 2022-01-06 MED ORDER — POTASSIUM CHLORIDE CRYS ER 20 MEQ PO TBCR: 40.0000 meq | EXTENDED_RELEASE_TABLET | Freq: Once | ORAL | Status: DC

## 2022-01-06 MED ORDER — ORAL CARE MOUTH RINSE
15.0000 mL | OROMUCOSAL | Status: DC
  Administered 2022-01-07 – 2022-01-10 (×14): 15 mL via OROMUCOSAL

## 2022-01-06 MED ORDER — POTASSIUM CHLORIDE 20 MEQ PO PACK
40.0000 meq | PACK | Freq: Once | ORAL | Status: AC
  Administered 2022-01-06: 40 meq
  Filled 2022-01-06: qty 2

## 2022-01-06 MED ORDER — OLANZAPINE 10 MG IM SOLR
5.0000 mg | Freq: Four times a day (QID) | INTRAMUSCULAR | Status: DC | PRN
  Administered 2022-01-06: 5 mg via INTRAMUSCULAR
  Filled 2022-01-06: qty 10

## 2022-01-06 MED ORDER — OLANZAPINE 5 MG PO TBDP
5.0000 mg | ORAL_TABLET | Freq: Every day | ORAL | Status: DC
Start: 2022-01-06 — End: 2022-01-13
  Administered 2022-01-06 – 2022-01-11 (×6): 5 mg via ORAL
  Filled 2022-01-06 (×7): qty 1

## 2022-01-06 MED ORDER — STERILE WATER FOR INJECTION IJ SOLN
INTRAMUSCULAR | Status: AC
  Administered 2022-01-06: 1 mL
  Filled 2022-01-06: qty 10

## 2022-01-06 MED ORDER — STERILE WATER FOR INJECTION IJ SOLN
INTRAMUSCULAR | Status: AC
  Filled 2022-01-06: qty 10

## 2022-01-06 NOTE — Progress Notes (Addendum)
Daily Progress Note   Carly Larson Name: Carly Larson       Date: 01/06/2022 DOB: 05/06/1951  Age: 71 y.o. MRN#: [id number] Attending Physician: Dessa Phi, DO Primary Care Physician: Cyndi Bender, PA-C Admit Date: 01/01/2022  Reason for Consultation/Follow-up: Establishing goals of care  Subjective: Carly Larson is sleeping and appears comfortable, no family at bedside  Length of Stay: 5  Current Medications: Scheduled Meds:   Chlorhexidine Gluconate Cloth  6 each Topical Daily   feeding supplement (PROSource TF)  45 mL Per Tube TID   heparin injection (subcutaneous)  5,000 Units Subcutaneous Q8H   losartan  50 mg Per Tube Daily   memantine  10 mg Per Tube BID   OLANZapine zydis  5 mg Oral QHS   pantoprazole sodium  40 mg Per Tube Daily   polyethylene glycol  17 g Per Tube Daily   senna-docusate  1 tablet Per Tube BID   temazepam  15 mg Per Tube QHS   venlafaxine  75 mg Per Tube BID WC    Continuous Infusions:  sodium chloride 10 mL/hr at 01/05/22 1400   feeding supplement (OSMOLITE 1.5 CAL) 1,000 mL (01/05/22 1508)   levETIRAcetam 500 mg (01/06/22 0916)   piperacillin-tazobactam (ZOSYN)  IV 3.375 g (01/05/22 2348)    PRN Meds: acetaminophen, fentaNYL (SUBLIMAZE) injection, hydrALAZINE, OLANZapine  Physical Exam Constitutional:      General: Carly Larson is not in acute distress.    Appearance: Carly Larson is ill-appearing.  Pulmonary:     Effort: Pulmonary effort is normal.  Skin:    General: Skin is warm and dry.  Neurological:     Mental Status: Carly Larson is disoriented.             Vital Signs: BP (!) 140/56 (BP Location: Right Arm)   Pulse 88   Temp 98.3 F (36.8 C) (Axillary)   Resp 19   Wt 79.3 kg   SpO2 96%   BMI 34.14 kg/m  SpO2: SpO2: 96 % O2 Device: O2 Device: Room Air O2 Flow  Rate: O2 Flow Rate (L/min): (S) 4 L/min  Intake/output summary:  Intake/Output Summary (Last 24 hours) at 01/06/2022 1111 Last data filed at 01/06/2022 0600 Gross per 24 hour  Intake 736.19 ml  Output 850 ml  Net -113.81 ml   LBM: Last BM Date : 01/05/22 Baseline Weight: Weight: 79.2 kg Most recent weight: Weight: 79.3 kg       Palliative Assessment/Data: PPS 40%    Flowsheet Rows    Flowsheet Row Most Recent Value  Intake Tab   Referral Department Critical care  Unit at Time of Referral ICU  Palliative Care Primary Diagnosis Neurology  Date Notified 01/03/22  Palliative Care Type New Palliative care  Reason for referral Clarify Goals of Care  Date of Admission 01/02/22  Date first seen by Palliative Care 01/03/22  # of days Palliative referral response time 0 Day(s)  # of days IP prior to Palliative referral 1  Clinical Assessment   Psychosocial & Spiritual Assessment   Palliative Care Outcomes        Carly Larson Active Problem List   Diagnosis Date Noted   IVH (intraventricular hemorrhage) ([place]) 01/01/2022   Spondylolisthesis,  lumbar region 09/10/2018   Spinal stenosis of lumbar region with neurogenic claudication 09/10/2018   Anemia due to acute blood loss 09/10/2018   Fusion of spine of lumbar region 09/09/2018   Dementia ([place]) 12/23/2017   Syncope 12/23/2017   Heart murmur 12/23/2017   Unilateral primary osteoarthritis, right knee 10/23/2017   Unilateral primary osteoarthritis, left knee 09/23/2017   Chronic pain of left knee 08/08/2017   Pain in right knee 08/08/2017   Sciatica, left side 08/08/2017   Memory loss 12/03/2013    Palliative Care Assessment & Plan   HPI: 71 y.o. female  with past medical history of HTN, GERD, depression/anxiety, insomnia, dementia, Chiari 1 malformation, and lumbar spondylolisthesis s/p lumbar fusion (L3-L4 08/2018) admitted on 01/01/2022 with s/p unwitnessed fall.  Carly Larson initially required intubation for airway protection but  was extubated 6/14.  Carly Larson was diagnosed with acute IVH due to fall.  Also found to have T3 spinous process fracture.  PMT consulted to discuss goals of care.  Assessment: RN reports agitation last night but Carly Larson slept very well after dose of Zyprexa -still sleeping this a.m.  Called to Carly Larson's daughter.  We reviewed her conversation from yesterday and Carly Larson has no questions or concerns, remains hopeful for plan of going home with hospice once Carly Larson's current condition is optimized.  We discussed her ongoing delirium and agitation.  Daughter explains that these are issues at home as well.  Daughter is grateful that Carly Larson responded well to Zyprexa and is wondering if this is something that could continue to be used.  We discussed Zyprexa can be given outpatient and we could trial a nightly dose of Zyprexa while Carly Larson is here to improve Carly Larson's sleeping patterns.  I also reviewed with her the risks of antipsychotics in patients with dementia specifically discussed increased mortality risk and cardiovascular risk.  We reviewed that decision has been made to focus on Carly Larson's quality of life and we hope Zyprexa improves quality of life by relieving agitation and improving sleep quality.  Therefore daughter accepts risks of starting Zyprexa and would like to trial scheduled dose at night and also continue as needed dose throughout the day.  I started scheduled dose at half the dose that was given last night since Carly Larson remains quite sleepy following dose.  Recommendations/Plan: Main goal remains for Carly Larson to return home with her husband and receive hospice support at home; [facility] [place] has been given referral for hospice support at home as Carly Larson was already connected to their palliative program Continue current measures to optimize in preparation for discharge home Initiate Zyprexa every night 5 mg sublingual, risks/benefits discussed with daughter who agrees with trial (Carly Larson  received 10 mg prn dose 6/16 and was too sedated following dose - reduced scheduled and PRN dose to 5 MG - consider further reduction to 2.5 if remains sedated) Consider stopping temazepam if sedation remains an issue Standard delirium management (adapted from NICE guidelines 2011 for prevention of delirium):  Provide continuity of care when possible (avoid frequent changing of surroundings and staff).  Frequent reorientation to time with:  A clock should always be visible.  Make sure Calendar/white board is updated.  Lights on/blinds open during the day and off/closed at night.  Encourage frequent family visits.  Monitor for and treat dehydration/constipation.  Optimize oxygen saturation.  Avoid catheters and IV's when possible and look for/treat infections.  Encourage early mobility.  Assess and treat pain  Ensure adequate nutrition and functioning dentures.  Address reversible  causes of hearing and visual impairment:  Use pocket talker if hearing aids are unavailable.  Avoid sleep disturbance (normalize sleep/wake cycle).  Minimize disturbances and consider NOT obtaining vitals at night if possible.  Review Medications to avoid polypharmacy and avoid deliriogenic medications when possible:  Benzodiazepines  Dihydropyridines.  Antihistamines.  Anticholinergics   (Possibly avoid: H2 blockers, tricyclic antidepressants, antiparkinson medications, steroids, NSAID's).   Code Status: DNR  Discharge Planning: Home with Hospice  Care plan was discussed with RN, Carly Larson's daughter [name]  Thank you for allowing the Palliative Medicine Team to assist in the care of this Carly Larson.   *Please note that this is a verbal dictation therefore any spelling or grammatical errors are due to the "[employer]" system interpretation.  Juel Burrow, DNP, [facility] Palliative Medicine Team Team Phone # [phone]  Pager [phone]

## 2022-01-06 NOTE — Progress Notes (Signed)
PROGRESS NOTE    Carly Larson  AGT:[id number] DOB: 1951-01-26 DOA: 01/01/2022 PCP: Cyndi Bender, PA-C     Brief Narrative:  Carly Larson is a 71 year old woman who presented to [facility] ED 6/12 after she was found down by her husband in the 9, s/p unwitnessed fall. PMHx significant for HTN, GERD, depression/anxiety, insomnia, dementia, Chiari 1 malformation, lumbar spondylolisthesis s/p lumbar fusion (L3-L4 08/2018). Per report, patient was more somnolent than usual prior to admission; she has dementia at baseline but is interactive/functional with assistance and confusion at times waxes/wanes. Husband assists her with ADLs including medication administration; she did not receive any additional doses. Reports that he found patient down in the [place] after a fall. Patient sustained a R lateral forehead laceration (repaired by EDP).    On ED arrival, patient was somnolent but was per report answering simple questions with EMS en route. Intubated for airway protection in the setting of somnolence/level 1 trauma. Vitals on arrival to ED demonstrated temp 97.6, tachycardia to 130s, mild hypertension, RR 25 and O2 sat 100%. Labs demonstrated mild leukocytosis with WBC 14.9, mild hyponatremia to 132, mild AKI with Cr 1.1 (baseline 0.8), normal INR 1.0. LA/PCT and BCx/UCx sent. Head/Neck imaging (CT Head, CT Maxillofacial, CT C-spine) demonstrated small acute IVH and nondisplaced fracture of T3 spinous process, otherwise negative. CT Chest/A/P demonstrated bilateral lower lobe/posteropr subpleural patchy subsegmental densities, ?atelectasis vs. pulmonary contusion vs PNA, otherwise negative for acute pathology. Trauma Surgery was consulted and recommended ICU admission. NSGY consulted for IVH and T3 spinous process fracture.  [facility] 6[facility].   New events last 24 hours / Subjective: Patient is alert but not oriented, mumbles incoherently.  She has bilateral wrist  restraints/mittens.  Assessment & Plan:    Principal Problem:   IVH (intraventricular hemorrhage) (HCC) Active Problems:   Dementia (HCC)   Fracture of spinous process of thoracic vertebra (HCC)   Chiari I malformation (HCC)   Acute metabolic encephalopathy   Acute respiratory failure with hypoxia and hypercapnia (HCC)   AKI (acute kidney injury) ([place])   HTN (hypertension)   Hypokalemia   DNR (do not resuscitate)   Acute IVH after fall at home Nondisplaced T3 spinous process fracture History of Chiari I malformation -Appreciate neurosurgery -Prophylactic Keppra for 7 days  Acute metabolic encephalopathy in setting of postconcussive plus IVH with baseline dementia -Continue temazepam, Effexor, memantine, olanzapine -Remains on tube feed via cortrak.  As her mentation improves, continue to advance p.o. diet and wean off tube feeding  Acute hypercarbic and hypoxemic respiratory failure -Extubated 6/14 -Remains on room air -She is now a DNR/DNI -On Zosyn for 7 days for atelectasis versus pneumonia, question aspiration  AKI -Resolved  Hypertension -Number  Hypokalemia -Replace, trend    DVT prophylaxis:  heparin injection 5,000 Units Start: 01/03/22 2200 SCDs Start: 01/01/22 2237  Code Status: DNR Family Communication: No family at bedside Disposition Plan:  Status is: Inpatient Remains inpatient appropriate because: Remains on tube feeding   Antimicrobials:  Anti-infectives (From admission, onward)    Start     Dose/Rate Route Frequency Ordered Stop   01/04/22 1600  piperacillin-tazobactam (ZOSYN) IVPB 3.375 g        3.375 g 12.5 mL/hr over 240 Minutes Intravenous Every 8 hours 01/04/22 0903 01/11/22 1559   01/04/22 1000  piperacillin-tazobactam (ZOSYN) IVPB 3.375 g        3.375 g 100 mL/hr over 30 Minutes Intravenous  Once 01/04/22 0900 01/04/22 1116  01/02/22 2200  cefTRIAXone (ROCEPHIN) 2 g in sodium chloride 0.9 % 100 mL IVPB  Status:  Discontinued         2 g 200 mL/hr over 30 Minutes Intravenous Every 24 hours 01/02/22 0826 01/04/22 0854   01/01/22 2200  cefTRIAXone (ROCEPHIN) 1 g in sodium chloride 0.9 % 100 mL IVPB        1 g 200 mL/hr over 30 Minutes Intravenous  Once 01/01/22 2153 01/02/22 0023   01/01/22 2200  azithromycin (ZITHROMAX) 500 mg in sodium chloride 0.9 % 250 mL IVPB        500 mg 250 mL/hr over 60 Minutes Intravenous  Once 01/01/22 2153 01/02/22 0335   01/01/22 2000  ceFAZolin (ANCEF) IVPB 2g/100 mL premix        2 g 200 mL/hr over 30 Minutes Intravenous  Once 01/01/22 1953 01/01/22 2114        Objective: Vitals:   01/05/22 2343 01/06/22 0400 01/06/22 0556 01/06/22 0820  BP: (!) 135/58 (!) 138/52  (!) 140/56  Pulse: 96 90  88  Resp: '20 20  19  '$ Temp: 99.4 F (37.4 C) 99.1 F (37.3 C)  98.3 F (36.8 C)  TempSrc: Axillary Axillary  Axillary  SpO2: 94% 93%  96%  Weight:   79.3 kg     Intake/Output Summary (Last 24 hours) at 01/06/2022 1155 Last data filed at 01/06/2022 0800 Gross per 24 hour  Intake 736.19 ml  Output 850 ml  Net -113.81 ml   Filed Weights   01/02/22 0120 01/06/22 0556  Weight: 79.2 kg 79.3 kg    Examination:  General exam: Appears calm  Respiratory system: Clear to auscultation anteriorly Cardiovascular system: S1 & S2 heard, tachycardic, regular rhythm Gastrointestinal system: Abdomen is nondistended, soft  Central nervous system: Alert to voice, not oriented and does not answer questions appropriately Skin: Bruising over the right eye  Data Reviewed: I have personally reviewed following labs and imaging studies  CBC: Recent Labs  Lab 01/01/22 1850 01/01/22 1857 01/01/22 2037 01/02/22 0255 01/04/22 0310 01/05/22 0413  WBC 14.9*  --   --  15.7* 18.0* 13.7*  NEUTROABS  --   --   --   --  14.2* 10.8*  HGB 14.8 15.6* 12.6 13.2  11.6* 11.9* 12.5  HCT 45.4 46.0 37.0 40.4  34.0* 36.0 37.9  MCV 96.8  --   --  94.0 94.7 93.3  PLT 336  --   --  247 243 578   Basic  Metabolic Panel: Recent Labs  Lab 01/02/22 0255 01/03/22 0109 01/04/22 0310 01/05/22 0413 01/06/22 0303  NA 137  137 135 134* 139 139  K 4.7  3.9 3.9 3.9 3.7 3.2*  CL 100 103 103 108 105  CO2 '26 23 23 '$ 21* 23  GLUCOSE 83 116* 96 87 136*  BUN 6* 10 8 6* 9  CREATININE 1.03* 0.96 0.79 0.84 0.82  CALCIUM 8.8* 8.3* 8.1* 8.3* 8.7*  MG 2.1 1.9 1.9 2.3 2.2  PHOS 3.2 2.6 3.8 2.3* 2.4*   GFR: CrCl cannot be calculated (Unknown ideal weight.). Liver Function Tests: Recent Labs  Lab 01/01/22 1850 01/04/22 0310  AST 40 33  ALT 26 18  ALKPHOS 150* 97  BILITOT 0.7 0.4  PROT 7.4 5.3*  ALBUMIN 4.1 2.5*   No results for input(s): "LIPASE", "AMYLASE" in the last 168 hours. No results for input(s): "AMMONIA" in the last 168 hours. Coagulation Profile: Recent Labs  Lab 01/01/22 1850  INR 1.0  Cardiac Enzymes: No results for input(s): "CKTOTAL", "CKMB", "CKMBINDEX", "TROPONINI" in the last 168 hours. BNP (last 3 results) No results for input(s): "PROBNP" in the last 8760 hours. HbA1C: No results for input(s): "HGBA1C" in the last 72 hours. CBG: Recent Labs  Lab 01/05/22 1528 01/05/22 2008 01/06/22 0025 01/06/22 0414 01/06/22 0819  GLUCAP 97 146* 113* 117* 108*   Lipid Profile: No results for input(s): "CHOL", "HDL", "LDLCALC", "TRIG", "CHOLHDL", "LDLDIRECT" in the last 72 hours. Thyroid Function Tests: No results for input(s): "TSH", "T4TOTAL", "FREET4", "T3FREE", "THYROIDAB" in the last 72 hours. Anemia Panel: No results for input(s): "VITAMINB12", "FOLATE", "FERRITIN", "TIBC", "IRON", "RETICCTPCT" in the last 72 hours. Sepsis Labs: Recent Labs  Lab 01/01/22 2327 01/01/22 2332 01/02/22 0255 01/02/22 0819 01/02/22 1052  PROCALCITON <0.10  --   --   --   --   LATICACIDVEN  --  2.3* 2.9* 1.4 1.5    Recent Results (from the past 240 hour(s))  Resp Panel by RT-PCR (Flu A&B, Covid) Anterior Nasal Swab     Status: None   Collection Time: 01/01/22  9:33 PM    Specimen: Anterior Nasal Swab  Result Value Ref Range Status   SARS Coronavirus 2 by RT PCR NEGATIVE NEGATIVE Final    Comment: (NOTE) SARS-CoV-2 target nucleic acids are NOT DETECTED.  The SARS-CoV-2 RNA is generally detectable in upper respiratory specimens during the acute phase of infection. The lowest concentration of SARS-CoV-2 viral copies this assay can detect is 138 copies/mL. A negative result does not preclude SARS-Cov-2 infection and should not be used as the sole basis for treatment or other patient management decisions. A negative result may occur with  improper specimen collection/handling, submission of specimen other than nasopharyngeal swab, presence of viral mutation(s) within the areas targeted by this assay, and inadequate number of viral copies(<138 copies/mL). A negative result must be combined with clinical observations, patient history, and epidemiological information. The expected result is Negative.  Fact Sheet for Patients:  EntrepreneurPulse.com.au  Fact Sheet for Healthcare Providers:  IncredibleEmployment.be  This test is no t yet approved or cleared by the [place] FDA and  has been authorized for detection and/or diagnosis of SARS-CoV-2 by FDA under an Emergency Use Authorization (EUA). This EUA will remain  in effect (meaning this test can be used) for the duration of the COVID-19 declaration under Section 564(b)(1) of the Act, 21 U.S.C.section 360bbb-3(b)(1), unless the authorization is terminated  or revoked sooner.       Influenza A by PCR NEGATIVE NEGATIVE Final   Influenza B by PCR NEGATIVE NEGATIVE Final    Comment: (NOTE) The Xpert Xpress SARS-CoV-2/FLU/RSV plus assay is intended as an aid in the diagnosis of influenza from Nasopharyngeal swab specimens and should not be used as a sole basis for treatment. Nasal washings and aspirates are unacceptable for Xpert Xpress  SARS-CoV-2/FLU/RSV testing.  Fact Sheet for Patients: EntrepreneurPulse.com.au  Fact Sheet for Healthcare Providers: IncredibleEmployment.be  This test is not yet approved or cleared by the [place] FDA and has been authorized for detection and/or diagnosis of SARS-CoV-2 by FDA under an Emergency Use Authorization (EUA). This EUA will remain in effect (meaning this test can be used) for the duration of the COVID-19 declaration under Section 564(b)(1) of the Act, 21 U.S.C. section 360bbb-3(b)(1), unless the authorization is terminated or revoked.  Performed at [facility], [place] [address], [place], [place] [address]   Blood culture (routine x 2)     Status: None  Collection Time: 01/01/22 10:22 PM   Specimen: BLOOD  Result Value Ref Range Status   Specimen Description BLOOD LEFT ANTECUBITAL  Final   Special Requests   Final    BOTTLES DRAWN AEROBIC AND ANAEROBIC Blood Culture results may not be optimal due to an excessive volume of blood received in culture bottles   Culture   Final    NO GROWTH 5 DAYS Performed at [facility] [address], [place], [place] [address]    Report Status 01/06/2022 FINAL  Final  MRSA Next Gen by PCR, Nasal     Status: None   Collection Time: 01/02/22  1:17 AM   Specimen: Nasal Mucosa; Nasal Swab  Result Value Ref Range Status   MRSA by PCR Next Gen NOT DETECTED NOT DETECTED Final    Comment: (NOTE) The GeneXpert MRSA Assay (FDA approved for NASAL specimens only), is one component of a comprehensive MRSA colonization surveillance program. It is not intended to diagnose MRSA infection nor to guide or monitor treatment for MRSA infections. Test performance is not FDA approved in patients less than 70 years old. Performed at [facility], [place] [address] [place], [place] [address]   Blood culture (routine x 2)     Status: None (Preliminary result)   Collection Time: 01/02/22  2:55 AM    Specimen: BLOOD  Result Value Ref Range Status   Specimen Description BLOOD RIGHT ANTECUBITAL  Final   Special Requests IN PEDIATRIC BOTTLE Blood Culture adequate volume  Final   Culture   Final    NO GROWTH 4 DAYS Performed at [facility], [place] [address] [place], [place] [address]    Report Status PENDING  Incomplete  Culture, Respiratory w Gram Stain     Status: None   Collection Time: 01/02/22  8:48 AM   Specimen: Tracheal Aspirate; Respiratory  Result Value Ref Range Status   Specimen Description TRACHEAL ASPIRATE  Final   Special Requests NONE  Final   Gram Stain   Final    FEW WBC PRESENT, PREDOMINANTLY MONONUCLEAR NO ORGANISMS SEEN    Culture   Final    Normal respiratory flora-no Staph aureus or Pseudomonas seen Performed at [facility], [address], [place], [place] [address]    Report Status 01/04/2022 FINAL  Final      Radiology Studies: DG Abd Portable 1V  Result Date: 01/05/2022 CLINICAL DATA:  [id number] to check for the feeding tube placement EXAM: PORTABLE ABDOMEN - 1 VIEW COMPARISON:  None Available. FINDINGS: There is a feeding tube with its tip at the pyloric region. Bowel-gas pattern is unremarkable in the partially visualized upper abdomen. Visualized lung [place] are unremarkable. IMPRESSION: Tip of the feeding tube seen at the pyloric region. Electronically Signed   By: Frazier Richards M.D.   On: 01/05/2022 11:51      Scheduled Meds:  Chlorhexidine Gluconate Cloth  6 each Topical Daily   feeding supplement (PROSource TF)  45 mL Per Tube TID   heparin injection (subcutaneous)  5,000 Units Subcutaneous Q8H   losartan  50 mg Per Tube Daily   memantine  10 mg Per Tube BID   OLANZapine zydis  5 mg Oral QHS   pantoprazole sodium  40 mg Per Tube Daily   polyethylene glycol  17 g Per Tube Daily   senna-docusate  1 tablet Per Tube BID   temazepam  15 mg Per Tube QHS   venlafaxine  75 mg Per  Tube BID WC   Continuous Infusions:  sodium  chloride 10 mL/hr at 01/05/22 1400   feeding supplement (OSMOLITE 1.5 CAL) 1,000 mL (01/05/22 1508)   levETIRAcetam 500 mg (01/06/22 0916)   piperacillin-tazobactam (ZOSYN)  IV 3.375 g (01/06/22 1117)     LOS: 5 days     Dessa Phi, DO [facility] 01/06/2022, 11:55 AM   Available via Epic secure chat 7am-7pm After these hours, please refer to coverage provider listed on [email or web address]

## 2022-01-06 NOTE — Progress Notes (Signed)
Speech Language Pathology Treatment: Dysphagia  Patient Details Name: Carly Larson MRN: [id number] DOB: 08/31/1950 Today's Date: 01/06/2022 Time: 7893-8101 SLP Time Calculation (min) (ACUTE ONLY): 13 min  Assessment / Plan / Recommendation Clinical Impression  Pt with improving alertness.  Today she was talkative, restless, and asking for help to "get out of here." She required max verbal/visual cues to attend. Accepted ice chips with improved oral attention and mastication.  There was a consistent, appreciable swallow with no overt s/s of aspiration.  She required constant cues and redirection due to attempts to climb out of the bed.  Recommend allowing ice chips for now. Otherwise, continue NPO.  SLP will follow for readiness to advance diet in context of [place].  D/W RN.    HPI HPI: Pt is a 71 yo female found down after unwitnessed fall. Pt was somnolent upon arrival and intubated in the ED (ETT 6/12-6/14). CT Head showed small IVH, C-spine with nondisplaced T3 spinous process fx, CT Chest with atelectasis vs. PNA. PMH includes: dementia, GERD, HTN, depression/anxiety, insomnia, chiari 1 malformation, lumbar spondylolisthesis s/p lumbar fusion (L3-L4 08/2018). Per chart husband assists with medication administration.      SLP Plan  Continue with current plan of care      Recommendations for follow up therapy are one component of a multi-disciplinary discharge planning process, led by the attending physician.  Recommendations may be updated based on patient status, additional functional criteria and insurance authorization.    Recommendations  Diet recommendations: NPO (ice chips) Medication Administration: Via alternative means                Oral Care Recommendations: Oral care prior to ice chip/H20 Follow Up Recommendations: Skilled nursing-short term rehab (<3 hours/day) Assistance recommended at discharge: Frequent or constant Supervision/Assistance SLP Visit Diagnosis: Dysphagia,  oropharyngeal phase (R13.12) Plan: Continue with current plan of care         [place]. Tivis Ringer, MA CCC/SLP Clinical Specialist - Acute Care SLP Acute Rehabilitation Services Office number [phone]   Carly Larson  01/06/2022, 3:30 PM

## 2022-01-06 NOTE — Care Management (Signed)
Received call from [place] from [place]. She states that they received referral yesterday from Kathie Rhodes, NP Palliative team for home hospice. (TOC not involved in referral) Benjamine Mola seeking clarification for discharge timeline after speaking with patient's daughter.  Damaris Schooner w MD, patient with cortrak, anticipate DC mid week, updated Benjamine Mola.

## 2022-01-07 DIAGNOSIS — G935 Compression of brain: Secondary | ICD-10-CM

## 2022-01-07 DIAGNOSIS — I615 Nontraumatic intracerebral hemorrhage, intraventricular: Secondary | ICD-10-CM | POA: Diagnosis not present

## 2022-01-07 DIAGNOSIS — S22038A Other fracture of third thoracic vertebra, initial encounter for closed fracture: Secondary | ICD-10-CM

## 2022-01-07 DIAGNOSIS — Z515 Encounter for palliative care: Secondary | ICD-10-CM | POA: Diagnosis not present

## 2022-01-07 DIAGNOSIS — N179 Acute kidney failure, unspecified: Secondary | ICD-10-CM

## 2022-01-07 DIAGNOSIS — J9602 Acute respiratory failure with hypercapnia: Secondary | ICD-10-CM

## 2022-01-07 DIAGNOSIS — Z7189 Other specified counseling: Secondary | ICD-10-CM | POA: Diagnosis not present

## 2022-01-07 DIAGNOSIS — Z66 Do not resuscitate: Secondary | ICD-10-CM | POA: Diagnosis not present

## 2022-01-07 DIAGNOSIS — G9341 Metabolic encephalopathy: Secondary | ICD-10-CM

## 2022-01-07 LAB — GLUCOSE, CAPILLARY
Glucose-Capillary: 89 mg/dL (ref 70–99)
Glucose-Capillary: 108 mg/dL — ABNORMAL HIGH (ref 70–99)
Glucose-Capillary: 138 mg/dL — ABNORMAL HIGH (ref 70–99)
Glucose-Capillary: 134 mg/dL — ABNORMAL HIGH (ref 70–99)
Glucose-Capillary: 130 mg/dL — ABNORMAL HIGH (ref 70–99)

## 2022-01-07 LAB — CULTURE, BLOOD (ROUTINE X 2)
Culture: NO GROWTH
Special Requests: ADEQUATE

## 2022-01-07 LAB — BASIC METABOLIC PANEL
Anion gap: 11 (ref 5–15)
BUN: 14 mg/dL (ref 8–23)
CO2: 21 mmol/L — ABNORMAL LOW (ref 22–32)
Calcium: 8.5 mg/dL — ABNORMAL LOW (ref 8.9–10.3)
Chloride: 110 mmol/L (ref 98–111)
Creatinine, Ser: 0.78 mg/dL (ref 0.44–1.00)
GFR, Estimated: >60 mL/min (ref >60)
Glucose, Bld: 115 mg/dL — ABNORMAL HIGH (ref 70–99)
Potassium: 4 mmol/L (ref 3.5–5.1)
Sodium: 142 mmol/L (ref 135–145)

## 2022-01-07 LAB — CBC
HCT: 39.3 % (ref 36.0–46.0)
Hemoglobin: 13.1 g/dL (ref 12.0–15.0)
MCH: 31 pg (ref 26.0–34.0)
MCHC: 33.3 g/dL (ref 30.0–36.0)
MCV: 93.1 fL (ref 80.0–100.0)
Platelets: 314 10*3/uL (ref 150–400)
RBC: 4.22 MIL/uL (ref 3.87–5.11)
RDW: 14.4 % (ref 11.5–15.5)
WBC: 12.7 10*3/uL — ABNORMAL HIGH (ref 4.0–10.5)
nRBC: 0 % (ref 0.0–0.2)

## 2022-01-07 LAB — MAGNESIUM: Magnesium: 2.2 mg/dL (ref 1.7–2.4)

## 2022-01-07 MED ORDER — FREE WATER
100.0000 mL | Status: DC
  Administered 2022-01-07 – 2022-01-08 (×6): 100 mL

## 2022-01-07 NOTE — Progress Notes (Signed)
Daily Progress Note   Patient Name: Carly Larson       Date: 01/07/2022 DOB: 09-16-1950  Age: 71 y.o. MRN#: [id number] Attending Physician: Dessa Phi, DO Primary Care Physician: Cyndi Bender, PA-C Admit Date: 01/01/2022  Reason for Consultation/Follow-up: Establishing goals of care  Subjective: Carly Larson is sleeping and appears comfortable. Husband is at the bedside.  Length of Stay: 6  Current Medications: Scheduled Meds:   Chlorhexidine Gluconate Cloth  6 each Topical Daily   feeding supplement (PROSource TF)  45 mL Per Tube TID   free water  100 mL Per Tube Q4H   heparin injection (subcutaneous)  5,000 Units Subcutaneous Q8H   losartan  50 mg Per Tube Daily   OLANZapine zydis  5 mg Oral QHS   mouth rinse  15 mL Mouth Rinse 4 times per day   pantoprazole sodium  40 mg Per Tube Daily   polyethylene glycol  17 g Per Tube Daily   senna-docusate  1 tablet Per Tube BID   venlafaxine  75 mg Per Tube BID WC    Continuous Infusions:  sodium chloride 10 mL/hr at 01/05/22 1400   feeding supplement (OSMOLITE 1.5 CAL) 1,000 mL (01/06/22 1550)   levETIRAcetam 500 mg (01/07/22 0846)   piperacillin-tazobactam (ZOSYN)  IV 3.375 g (01/07/22 0917)    PRN Meds: acetaminophen, fentaNYL (SUBLIMAZE) injection, hydrALAZINE, OLANZapine, mouth rinse  Physical Exam Constitutional:      General: She is sleeping. She is not in acute distress.    Appearance: She is ill-appearing.  Pulmonary:     Effort: Pulmonary effort is normal.  Skin:    General: Skin is warm and dry.             Vital Signs: BP (!) 148/68 (BP Location: Right Arm)   Pulse 94   Temp 98.5 F (36.9 C) (Oral)   Resp 18   Wt 81.9 kg   SpO2 97%   BMI 35.26 kg/m  SpO2: SpO2: 97 % O2 Device: O2 Device: Room Air O2 Flow Rate:  O2 Flow Rate (L/min): (S) 4 L/min  Intake/output summary:  Intake/Output Summary (Last 24 hours) at 01/07/2022 1103 Last data filed at 01/07/2022 1026 Gross per 24 hour  Intake 393.33 ml  Output 700 ml  Net -306.67 ml    LBM: Last BM Date : 01/06/22 Baseline Weight: Weight: 79.2 kg Most recent weight: Weight: 81.9 kg       Palliative Assessment/Data: PPS 40%    Flowsheet Rows    Flowsheet Row Most Recent Value  Intake Tab   Referral Department Critical care  Unit at Time of Referral ICU  Palliative Care Primary Diagnosis Neurology  Date Notified 01/03/22  Palliative Care Type New Palliative care  Reason for referral Clarify Goals of Care  Date of Admission 01/02/22  Date first seen by Palliative Care 01/03/22  # of days Palliative referral response time 0 Day(s)  # of days IP prior to Palliative referral 1  Clinical Assessment   Psychosocial & Spiritual Assessment   Palliative Care Outcomes        Patient Active Problem List   Diagnosis Date Noted   Fracture of spinous process of thoracic vertebra (  [place]) 01/06/2022   Chiari I malformation ([place]) 09/62/8366   Acute metabolic encephalopathy 29/47/6546   Acute respiratory failure with hypoxia and hypercapnia ([place]) 01/06/2022   AKI (acute kidney injury) ([place]) 01/06/2022   HTN (hypertension) 01/06/2022   Hypokalemia 01/06/2022   DNR (do not resuscitate) 01/06/2022   IVH (intraventricular hemorrhage) ([place]) 01/01/2022   Spondylolisthesis, lumbar region 09/10/2018   Spinal stenosis of lumbar region with neurogenic claudication 09/10/2018   Anemia due to acute blood loss 09/10/2018   Fusion of spine of lumbar region 09/09/2018   Dementia ([place]) 12/23/2017   Syncope 12/23/2017   Heart murmur 12/23/2017   Unilateral primary osteoarthritis, right knee 10/23/2017   Unilateral primary osteoarthritis, left knee 09/23/2017   Chronic pain of left knee 08/08/2017   Pain in right knee 08/08/2017   Sciatica, left side 08/08/2017    Memory loss 12/03/2013    Palliative Care Assessment & Plan   HPI: 71 y.o. female  with past medical history of HTN, GERD, depression/anxiety, insomnia, dementia, Chiari 1 malformation, and lumbar spondylolisthesis s/p lumbar fusion (L3-L4 08/2018) admitted on 01/01/2022 with s/p unwitnessed fall.  Patient initially required intubation for airway protection but was extubated 6/14.  Patient was diagnosed with acute IVH due to fall.  Also found to have T3 spinous process fracture.  PMT consulted to discuss goals of care.  Assessment: Per nurse no agitation overnight with scheduled 8m Zyprexa, still sleeping mostly today.  Later in the day I met with that patient's daughter.  We discussed current plan along with hospitalist discussion with the patient's daughter.  Notes that the hospitalist that for the patient to be able to go to rehab she would need to get up, interact with PT, participate in therapy, be able to eat on her own and have core track removed.  We discussed with ongoing agitation we are making adjustments to medications to try to reach that fine-line between oversedated/unable to participate and eat versus agitated.  She understands that we may not reach this fine-line.  She states that she spoke with hospice who is trying to order DME.  She is concerned that they will "lose hospice" if they do not proceed with going home with hospice.  However, in her current state as nonmobile, bedbound, not able to eat she is not able to be cared for at home.  The patient's husband has some dementia himself and the patient's daughter lives over an hour away.  At this point we recommended continuing medication adjustments, allowing 1 to 2 days for outcomes.  At that point if she has not improved to the point of being able to eat and participate in therapy then we can have further discussions about hospice options. I brought up the topic of PEG tube and she states that she and her father spoke and the  patient would absolutely not want a PEG tube.  Given this, she may be appropriate for residential hospice if she is not eating or drinking.  At this point no agitation overnight, not yet today.  She is only received 1 dose of the scheduled 5 mg Zyprexa.  I will not make any further adjustments as Restoril has already been discontinued per hospitalist.  We will give it a day to see how she does and readdress tomorrow.  Recommendations/Plan: Initially the main goal was for patient to return home with her husband and receive hospice support at home, however substantial concern about ability for husband to provide 24 hour care if bedbound/not eating.  Continue medication adjustments to see if we can get her more awake and participating in therapy, eating. NO PEG TUBE Continue Zyprexa every night 5 mg sublingual, risks/benefits discussed with daughter who agrees with trial (patient received 10 mg prn dose 6/16 and was too sedated following dose - reduced scheduled and PRN dose to 5 MG - consider further reduction to 2.5 if remains sedated) Time for outcomes Standard delirium management (adapted from NICE guidelines 2011 for prevention of delirium):  Provide continuity of care when possible (avoid frequent changing of surroundings and staff).  Frequent reorientation to time with:  A clock should always be visible.  Make sure Calendar/white board is updated.  Lights on/blinds open during the day and off/closed at night.  Encourage frequent family visits.  Monitor for and treat dehydration/constipation.  Optimize oxygen saturation.  Avoid catheters and IV's when possible and look for/treat infections.  Encourage early mobility.  Assess and treat pain  Ensure adequate nutrition and functioning dentures.  Address reversible causes of hearing and visual impairment:  Use pocket talker if hearing aids are unavailable.  Avoid sleep disturbance (normalize sleep/wake cycle).  Minimize disturbances and  consider NOT obtaining vitals at night if possible.  Review Medications to avoid polypharmacy and avoid deliriogenic medications when possible:  Benzodiazepines  Dihydropyridines.  Antihistamines.  Anticholinergics   (Possibly avoid: H2 blockers, tricyclic antidepressants, antiparkinson medications, steroids, NSAID's).   Code Status: DNR  Discharge Planning: To Be Determined  Care plan was discussed with RN, patient's daughter [name]  Thank you for allowing the Palliative Medicine Team to assist in the care of this patient.  Billing based on time  Time Total: 60 min  Visit consisted of counseling and education dealing with the complex and emotionally intense issues of symptom management and palliative care in the setting of serious and potentially life-threatening illness. Greater than 50%  of this time was spent counseling and coordinating care related to the above assessment and plan.   Juel Burrow, DNP, [facility] Palliative Medicine Team Team Phone # [phone]  Pager [phone]

## 2022-01-07 NOTE — IPAL (Signed)
  Interdisciplinary Goals of Care Family Meeting   Date carried out: 01/07/2022  Location of the meeting: Phone conference  Member's involved: Physician and Family Member or next of kin (daughter [name])  Durable Power of Tour manager: Husband    Discussion: We discussed goals of care for [employer].  Family is hopeful to transition patient home with home hospice on discharge.  If close to discharge, family cannot care for patient with her limited mobility, they are open to skilled nursing facility placement.  Daughter remains realistic regarding patient's progress.  We discussed her main issues today which is her lethargy.  We discontinued Restoril nightly in hopes of getting her more awake during the day to work with speech therapy and physical therapy.  Patient remains on cortrak for tube feedings.  We discussed that patient will need to demonstrate being able to participate with speech therapy, have adequate oral nutrition, be off cortrak tube feeding before considering discharge.  If patient continues to remain with poor oral intake, last resort is to consider PEG tube.  If PEG tube is not desired, would have to have a conversation regarding end-of-life.  Code status: Full DNR  Disposition: Continue current acute care  Time spent for the meeting: 15 minutes     Dessa Phi, DO  01/07/2022, 10:17 AM

## 2022-01-07 NOTE — Progress Notes (Signed)
PROGRESS NOTE    Carly Larson  LYY:[id number] DOB: 1950/10/02 DOA: 01/01/2022 PCP: Cyndi Bender, PA-C     Brief Narrative:  Carly Larson is a 71 year old woman who presented to [facility] ED 6/12 after she was found down [place] her [place] in the [place], s/p unwitnessed fall. PMHx significant for HTN, GERD, depression/anxiety, insomnia, dementia, Chiari 1 malformation, lumbar spondylolisthesis s/p lumbar fusion (L3-L4 08/2018). Per report, patient was more somnolent than usual prior to admission; she has dementia at baseline but is interactive/functional with assistance and confusion at times waxes/wanes. Husband assists her with ADLs including medication administration; she did not receive any additional doses. Reports that he found patient down in the [place] after a fall. Patient sustained a R lateral forehead laceration (repaired by EDP).    On ED arrival, patient was somnolent but was per report answering simple questions with EMS en route. Intubated for airway protection in the setting of somnolence/level 1 trauma. Vitals on arrival to ED demonstrated temp 97.6, tachycardia to 130s, mild hypertension, RR 25 and O2 sat 100%. Labs demonstrated mild leukocytosis with WBC 14.9, mild hyponatremia to 132, mild AKI with Cr 1.1 (baseline 0.8), normal INR 1.0. LA/PCT and BCx/UCx sent. Head/Neck imaging (CT Head, CT Maxillofacial, CT C-spine) demonstrated small acute IVH and nondisplaced fracture of T3 spinous process, otherwise negative. CT Chest/A/P demonstrated bilateral lower lobe/posteropr subpleural patchy subsegmental densities, ?atelectasis vs. pulmonary contusion vs PNA, otherwise negative for acute pathology. Trauma Surgery was consulted and recommended ICU admission. NSGY consulted for IVH and T3 spinous process fracture.  [facility] was transferred to [facility] 6/17.   New events last 24 hours / Subjective: Patient very lethargic this morning.  No acute events overnight.  Remove sutures  today  Assessment & Plan:    Principal Problem:   IVH (intraventricular hemorrhage) (HCC) Active Problems:   Dementia (HCC)   Fracture of spinous process of thoracic vertebra (H[facility])   Chiari I malformation (H[facility])   Acute metabolic encephalopathy   Acute respiratory failure with hypoxia and hypercapnia (H[facility])   AKI (acute kidney injury) ([place])   HTN (hypertension)   Hypokalemia   DNR (do not resuscitate)   Acute IVH after fall at home Nondisplaced T3 spinous process fracture History of Chiari I malformation -Appreciate neurosurgery -Prophylactic Keppra for 7 days  Acute metabolic encephalopathy in setting of postconcussive plus IVH with baseline dementia -Continue Effexor, olanzapine -Remains on tube feed via cortrak.  As her mentation improves, continue to advance p.o. diet and wean off tube feeding -Stop Restoril  Acute hypercarbic and hypoxemic respiratory failure -Extubated 6/14 -Remains on room air -She is now a DNR/DNI -On Zosyn for 7 days for atelectasis versus pneumonia, question aspiration  AKI -Resolved  Hypertension -Cozaar    DVT prophylaxis:  heparin injection 5,000 Units Start: 01/03/22 2200 SCDs Start: 01/01/22 2237  Code Status: DNR Family Communication: Discussed with daughter over the phone Disposition Plan:  Status is: Inpatient Remains inpatient appropriate because: Remains on tube feeding   Antimicrobials:  Anti-infectives (From admission, onward)    Start     Dose/Rate Route Frequency Ordered Stop   01/04/22 1600  piperacillin-tazobactam (ZOSYN) IVPB 3.375 g        3.375 g 12.5 mL/hr over 240 Minutes Intravenous Every 8 hours 01/04/22 0903 01/11/22 1559   01/04/22 1000  piperacillin-tazobactam (ZOSYN) IVPB 3.375 g        3.375 g 100 mL/hr over 30 Minutes Intravenous  Once 01/04/22 0900 01/04/22 1116  01/02/22 2200  cefTRIAXone (ROCEPHIN) 2 g in sodium chloride 0.9 % 100 mL IVPB  Status:  Discontinued        2 g 200 mL/hr over 30  Minutes Intravenous Every 24 hours 01/02/22 0826 01/04/22 0854   01/01/22 2200  cefTRIAXone (ROCEPHIN) 1 g in sodium chloride 0.9 % 100 mL IVPB        1 g 200 mL/hr over 30 Minutes Intravenous  Once 01/01/22 2153 01/02/22 0023   01/01/22 2200  azithromycin (ZITHROMAX) 500 mg in sodium chloride 0.9 % 250 mL IVPB        500 mg 250 mL/hr over 60 Minutes Intravenous  Once 01/01/22 2153 01/02/22 0335   01/01/22 2000  ceFAZolin (ANCEF) IVPB 2g/100 mL premix        2 g 200 mL/hr over 30 Minutes Intravenous  Once 01/01/22 1953 01/01/22 2114        Objective: Vitals:   01/07/22 0432 01/07/22 0500 01/07/22 0744 01/07/22 0800  BP: (!) 155/73  (!) 147/65 (!) 148/68  Pulse: 80  79 94  Resp: '18  18 18  '$ Temp: 98.3 F (36.8 C)  98 F (36.7 C) 98.5 F (36.9 C)  TempSrc: Axillary  Oral Oral  SpO2: 93%  97% 97%  Weight:  81.9 kg      Intake/Output Summary (Last 24 hours) at 01/07/2022 1020 Last data filed at 01/06/2022 1800 Gross per 24 hour  Intake 393.33 ml  Output --  Net 393.33 ml    Filed Weights   01/02/22 0120 01/06/22 0556 01/07/22 0500  Weight: 79.2 kg 79.3 kg 81.9 kg    Examination:  General exam: Appears calm, lethargic  Respiratory system: Clear to auscultation anteriorly Cardiovascular system: S1 & S2 heard, RRR Gastrointestinal system: Abdomen is nondistended, soft  Skin: Bruising over the right eye, sutures seen over right forehead  Data Reviewed: I have personally reviewed following labs and imaging studies  CBC: Recent Labs  Lab 01/01/22 1850 01/01/22 1857 01/01/22 2037 01/02/22 0255 01/04/22 0310 01/05/22 0413 01/07/22 0252  WBC 14.9*  --   --  15.7* 18.0* 13.7* 12.7*  NEUTROABS  --   --   --   --  14.2* 10.8*  --   HGB 14.8   < > 12.6 13.2  11.6* 11.9* 12.5 13.1  HCT 45.4   < > 37.0 40.4  34.0* 36.0 37.9 39.3  MCV 96.8  --   --  94.0 94.7 93.3 93.1  PLT 336  --   --  247 243 262 314   < > = values in this interval not displayed.    Basic  Metabolic Panel: Recent Labs  Lab 01/02/22 0255 01/03/22 0109 01/04/22 0310 01/05/22 0413 01/06/22 0303 01/07/22 0252  NA 137  137 135 134* 139 139 142  K 4.7  3.9 3.9 3.9 3.7 3.2* 4.0  CL 100 103 103 108 105 110  CO2 '26 23 23 '$ 21* 23 21*  GLUCOSE 83 116* 96 87 136* 115*  BUN 6* 10 8 6* 9 14  CREATININE 1.03* 0.96 0.79 0.84 0.82 0.78  CALCIUM 8.8* 8.3* 8.1* 8.3* 8.7* 8.5*  MG 2.1 1.9 1.9 2.3 2.2 2.2  PHOS 3.2 2.6 3.8 2.3* 2.4*  --     GFR: CrCl cannot be calculated (Unknown ideal weight.). Liver Function Tests: Recent Labs  Lab 01/01/22 1850 01/04/22 0310  AST 40 33  ALT 26 18  ALKPHOS 150* 97  BILITOT 0.7 0.4  PROT 7.4 5.3*  ALBUMIN 4.1 2.5*    No results for input(s): "LIPASE", "AMYLASE" in the last 168 hours. No results for input(s): "AMMONIA" in the last 168 hours. Coagulation Profile: Recent Labs  Lab 01/01/22 1850  INR 1.0    Cardiac Enzymes: No results for input(s): "CKTOTAL", "CKMB", "CKMBINDEX", "TROPONINI" in the last 168 hours. BNP (last 3 results) No results for input(s): "PROBNP" in the last 8760 hours. HbA1C: No results for input(s): "HGBA1C" in the last 72 hours. CBG: Recent Labs  Lab 01/06/22 1617 01/06/22 1948 01/06/22 2355 01/07/22 0315 01/07/22 0826  GLUCAP 115* 108* 85 89 108*    Lipid Profile: No results for input(s): "CHOL", "HDL", "LDLCALC", "TRIG", "CHOLHDL", "LDLDIRECT" in the last 72 hours. Thyroid Function Tests: No results for input(s): "TSH", "T4TOTAL", "FREET4", "T3FREE", "THYROIDAB" in the last 72 hours. Anemia Panel: No results for input(s): "VITAMINB12", "FOLATE", "FERRITIN", "TIBC", "IRON", "RETICCTPCT" in the last 72 hours. Sepsis Labs: Recent Labs  Lab 01/01/22 2327 01/01/22 2332 01/02/22 0255 01/02/22 0819 01/02/22 1052  PROCALCITON <0.10  --   --   --   --   LATICACIDVEN  --  2.3* 2.9* 1.4 1.5     Recent Results (from the past 240 hour(s))  Resp Panel by RT-PCR (Flu A&B, Covid) Anterior Nasal Swab      Status: None   Collection Time: 01/01/22  9:33 PM   Specimen: Anterior Nasal Swab  Result Value Ref Range Status   SARS Coronavirus 2 by RT PCR NEGATIVE NEGATIVE Final    Comment: (NOTE) SARS-CoV-2 target nucleic acids are NOT DETECTED.  The SARS-CoV-2 RNA is generally detectable in upper respiratory specimens during the acute phase of infection. The lowest concentration of SARS-CoV-2 viral copies this assay can detect is 138 copies/mL. A negative result does not preclude SARS-Cov-2 infection and should not be used as the sole basis for treatment or other patient management decisions. A negative result may occur with  improper specimen collection/handling, submission of specimen other than nasopharyngeal swab, presence of viral mutation(s) within the areas targeted by this assay, and inadequate number of viral copies(<138 copies/mL). A negative result must be combined with clinical observations, patient history, and epidemiological information. The expected result is Negative.  Fact Sheet for Patients:  EntrepreneurPulse.com.au  Fact Sheet for Healthcare Providers:  IncredibleEmployment.be  This test is no t yet approved or cleared by the [place] FDA and  has been authorized for detection and/or diagnosis of SARS-CoV-2 by FDA under an Emergency Use Authorization (EUA). This EUA will remain  in effect (meaning this test can be used) for the duration of the COVID-19 declaration under Section 564(b)(1) of the Act, 21 U.S.C.section 360bbb-3(b)(1), unless the authorization is terminated  or revoked sooner.       Influenza A by PCR NEGATIVE NEGATIVE Final   Influenza B by PCR NEGATIVE NEGATIVE Final    Comment: (NOTE) The Xpert Xpress SARS-CoV-2/FLU/RSV plus assay is intended as an aid in the diagnosis of influenza from Nasopharyngeal swab specimens and should not be used as a sole basis for treatment. Nasal washings and aspirates are  unacceptable for Xpert Xpress SARS-CoV-2/FLU/RSV testing.  Fact Sheet for Patients: EntrepreneurPulse.com.au  Fact Sheet for Healthcare Providers: IncredibleEmployment.be  This test is not yet approved or cleared by the [place] FDA and has been authorized for detection and/or diagnosis of SARS-CoV-2 by FDA under an Emergency Use Authorization (EUA). This EUA will remain in effect (meaning this test can be used) for the duration of the COVID-19 declaration under  Section 564(b)(1) of the Act, 21 U.S.C. section 360bbb-3(b)(1), unless the authorization is terminated or revoked.  Performed at [facility], [place] [address] [place], [place] [address]   Blood culture (routine x 2)     Status: None   Collection Time: 01/01/22 10:22 PM   Specimen: BLOOD  Result Value Ref Range Status   Specimen Description BLOOD LEFT ANTECUBITAL  Final   Special Requests   Final    BOTTLES DRAWN AEROBIC AND ANAEROBIC Blood Culture results may not be optimal due to an excessive volume of blood received in culture bottles   Culture   Final    NO GROWTH 5 DAYS Performed at [facility], [place] [address] [place], [place] [address]    Report Status 01/06/2022 FINAL  Final  MRSA Next Gen by PCR, Nasal     Status: None   Collection Time: 01/02/22  1:17 AM   Specimen: Nasal Mucosa; Nasal Swab  Result Value Ref Range Status   MRSA by PCR Next Gen NOT DETECTED NOT DETECTED Final    Comment: (NOTE) The GeneXpert MRSA Assay (FDA approved for NASAL specimens only), is one component of a comprehensive MRSA colonization surveillance program. It is not intended to diagnose MRSA infection nor to guide or monitor treatment for MRSA infections. Test performance is not FDA approved in patients less than 66 years old. Performed at [facility], [place] [address] [place], [place] [address]   Blood culture (routine x 2)     Status: None   Collection Time: 01/02/22   2:55 AM   Specimen: BLOOD  Result Value Ref Range Status   Specimen Description BLOOD RIGHT ANTECUBITAL  Final   Special Requests IN PEDIATRIC BOTTLE Blood Culture adequate volume  Final   Culture   Final    NO GROWTH 5 DAYS Performed at [facility], [place] [address] [place], [place] [address]    Report Status 01/07/2022 FINAL  Final  Culture, Respiratory w Gram Stain     Status: None   Collection Time: 01/02/22  8:48 AM   Specimen: Tracheal Aspirate; Respiratory  Result Value Ref Range Status   Specimen Description TRACHEAL ASPIRATE  Final   Special Requests NONE  Final   Gram Stain   Final    FEW WBC PRESENT, PREDOMINANTLY MONONUCLEAR NO ORGANISMS SEEN    Culture   Final    Normal respiratory flora-no Staph aureus or Pseudomonas seen Performed at [facility], [address] [place], [place] [address]    Report Status 01/04/2022 FINAL  Final      Radiology Studies: DG Abd Portable 1V  Result Date: 01/05/2022 CLINICAL DATA:  [id number] to check for the feeding tube placement EXAM: PORTABLE ABDOMEN - 1 VIEW COMPARISON:  None Available. FINDINGS: There is a feeding tube with its tip at the pyloric region. Bowel-gas pattern is unremarkable in the partially visualized upper abdomen. Visualized lung Schlechter are unremarkable. IMPRESSION: Tip of the feeding tube seen at the pyloric region. Electronically Signed   By: Frazier Richards M.D.   On: 01/05/2022 11:51      Scheduled Meds:  Chlorhexidine Gluconate Cloth  6 each Topical Daily   feeding supplement (PROSource TF)  45 mL Per Tube TID   heparin injection (subcutaneous)  5,000 Units Subcutaneous Q8H   losartan  50 mg Per Tube Daily   memantine  10 mg Per Tube BID   OLANZapine zydis  5 mg Oral QHS  mouth rinse  15 mL Mouth Rinse 4 times per day   pantoprazole sodium  40 mg Per Tube Daily   polyethylene glycol  17 g Per Tube Daily   senna-docusate  1 tablet Per Tube BID   venlafaxine  75 mg Per Tube BID WC    Continuous Infusions:  sodium chloride 10 mL/hr at 01/05/22 1400   feeding supplement (OSMOLITE 1.5 CAL) 1,000 mL (01/06/22 1550)   levETIRAcetam 500 mg (01/07/22 0846)   piperacillin-tazobactam (ZOSYN)  IV 3.375 g (01/07/22 0917)     LOS: 6 days     Dessa Phi, DO [facility] 01/07/2022, 10:20 AM   Available via Epic secure chat 7am-7pm After these hours, please refer to coverage provider listed on [email or web address]

## 2022-01-07 NOTE — Plan of Care (Signed)
  Problem: Clinical Measurements: Goal: Ability to maintain clinical measurements within normal limits will improve Outcome: Progressing Goal: Cardiovascular complication will be avoided Outcome: Progressing   Problem: Nutrition: Goal: Adequate nutrition will be maintained Outcome: Progressing   Problem: Coping: Goal: Level of anxiety will decrease Outcome: Progressing   Problem: Elimination: Goal: Will not experience complications related to bowel motility Outcome: Progressing Goal: Will not experience complications related to urinary retention Outcome: Progressing   Problem: Safety: Goal: Ability to remain free from injury will improve Outcome: Progressing   Problem: Safety: Goal: Non-violent Restraint(s) Outcome: Progressing   Problem: Education: Goal: Knowledge of General Education information will improve Description: Including pain rating scale, medication(s)/side effects and non-pharmacologic comfort measures Outcome: Not Progressing   Problem: Health Behavior/Discharge Planning: Goal: Ability to manage health-related needs will improve Outcome: Not Progressing   Problem: Clinical Measurements: Goal: Will remain free from infection Outcome: Not Progressing Goal: Diagnostic test results will improve Outcome: Not Progressing Goal: Respiratory complications will improve Outcome: Not Progressing   Problem: Activity: Goal: Risk for activity intolerance will decrease Outcome: Not Progressing   Problem: Pain Managment: Goal: General experience of comfort will improve Outcome: Not Progressing   Problem: Skin Integrity: Goal: Risk for impaired skin integrity will decrease Outcome: Not Progressing

## 2022-01-08 DIAGNOSIS — I615 Nontraumatic intracerebral hemorrhage, intraventricular: Secondary | ICD-10-CM | POA: Diagnosis not present

## 2022-01-08 LAB — GLUCOSE, CAPILLARY
Glucose-Capillary: 118 mg/dL — ABNORMAL HIGH (ref 70–99)
Glucose-Capillary: 120 mg/dL — ABNORMAL HIGH (ref 70–99)
Glucose-Capillary: 133 mg/dL — ABNORMAL HIGH (ref 70–99)
Glucose-Capillary: 136 mg/dL — ABNORMAL HIGH (ref 70–99)
Glucose-Capillary: 138 mg/dL — ABNORMAL HIGH (ref 70–99)
Glucose-Capillary: 96 mg/dL (ref 70–99)
Glucose-Capillary: 99 mg/dL (ref 70–99)

## 2022-01-08 MED ORDER — LOSARTAN POTASSIUM 50 MG PO TABS
50.0000 mg | ORAL_TABLET | Freq: Every day | ORAL | Status: DC
  Administered 2022-01-08 – 2022-01-12 (×5): 50 mg via ORAL
  Filled 2022-01-08 (×5): qty 1

## 2022-01-08 MED ORDER — SENNOSIDES-DOCUSATE SODIUM 8.6-50 MG PO TABS
1.0000 | ORAL_TABLET | Freq: Two times a day (BID) | ORAL | Status: DC
  Administered 2022-01-08 – 2022-01-12 (×6): 1 via ORAL
  Filled 2022-01-08 (×7): qty 1

## 2022-01-08 MED ORDER — ACETAMINOPHEN 325 MG PO TABS: 650.0000 mg | ORAL_TABLET | Freq: Four times a day (QID) | ORAL | Status: DC | PRN

## 2022-01-08 MED ORDER — VENLAFAXINE HCL 37.5 MG PO TABS
75.0000 mg | ORAL_TABLET | Freq: Two times a day (BID) | ORAL | Status: DC
  Administered 2022-01-08 – 2022-01-12 (×9): 75 mg via ORAL
  Filled 2022-01-08 (×9): qty 2

## 2022-01-08 MED ORDER — PANTOPRAZOLE 2 MG/ML SUSPENSION
40.0000 mg | Freq: Every day | ORAL | Status: DC
  Administered 2022-01-08 – 2022-01-11 (×4): 40 mg via ORAL
  Filled 2022-01-08 (×4): qty 20

## 2022-01-08 MED ORDER — POLYETHYLENE GLYCOL 3350 17 G PO PACK
17.0000 g | PACK | Freq: Every day | ORAL | Status: DC
  Administered 2022-01-08: 17 g via ORAL
  Filled 2022-01-08 (×2): qty 1

## 2022-01-08 NOTE — Progress Notes (Signed)
Name: Carly Larson DOB: Feb 18, 1951  Please be advised that the above-named patient will require a short-term nursing home stay -- anticipated 30 days or less for rehabilitation and strengthening. The plan is for return home.    Please be advised that the above-named patient has a primary diagnosis of dementia which supersedes any psychiatric diagnosis.

## 2022-01-08 NOTE — NC FL2 (Signed)
[place] MEDICAID FL2 LEVEL OF CARE SCREENING TOOL     IDENTIFICATION  Patient Name: Carly Larson Birthdate: [birth year] Sex: female Admission Date (Current Location): 01/01/2022  [facility] and [place] Number:  [employer] and Address:  [facility], [place] [address], [place], [place] [address]      Provider Number: [id number]  Attending Physician Name and Address:  Dessa Phi, DO  Relative Name and Phone Number:       Current Level of Care: Hospital Recommended Level of Care: [place] Prior Approval Number:    Date Approved/Denied:   PASRR Number: Manual review  Discharge Plan: SNF    Current Diagnoses: Patient Active Problem List   Diagnosis Date Noted   Fracture of spinous process of thoracic vertebra ([place]) 01/06/2022   Chiari I malformation ([place]) 97/67/3419   Acute metabolic encephalopathy 37/90/2409   Acute respiratory failure with hypoxia and hypercapnia ([place]) 01/06/2022   AKI (acute kidney injury) ([place]) 01/06/2022   HTN (hypertension) 01/06/2022   Hypokalemia 01/06/2022   DNR (do not resuscitate) 01/06/2022   IVH (intraventricular hemorrhage) ([place]) 01/01/2022   Spondylolisthesis, lumbar region 09/10/2018   Spinal stenosis of lumbar region with neurogenic claudication 09/10/2018   Anemia due to acute blood loss 09/10/2018   Fusion of spine of lumbar region 09/09/2018   Dementia ([place]) 12/23/2017   Syncope 12/23/2017   Heart murmur 12/23/2017   Unilateral primary osteoarthritis, right knee 10/23/2017   Unilateral primary osteoarthritis, left knee 09/23/2017   Chronic pain of left knee 08/08/2017   Pain in right knee 08/08/2017   Sciatica, left side 08/08/2017   Memory loss 12/03/2013    Orientation RESPIRATION BLADDER Height & Weight     Self  Normal Incontinent Weight: 176 lb 12.9 oz (80.2 kg) Height:     BEHAVIORAL SYMPTOMS/MOOD NEUROLOGICAL BOWEL NUTRITION STATUS      Incontinent Diet (see DC  summary)  AMBULATORY STATUS COMMUNICATION OF NEEDS Skin   Extensive Assist Verbally Skin abrasions (face laceration, gauze dressing)                       Personal Care Assistance Level of Assistance  Bathing, Feeding, Dressing Bathing Assistance: Maximum assistance Feeding assistance: Maximum assistance Dressing Assistance: Maximum assistance     Functional Limitations Info  Speech     Speech Info: Impaired (incomprehensible)    SPECIAL CARE FACTORS FREQUENCY  PT (By licensed PT), OT (By licensed OT), Speech therapy     PT Frequency: 5x/wk OT Frequency: 5x/wk     Speech Therapy Frequency: 5x/wk      Contractures Contractures Info: Not present    Additional Factors Info  Code Status, Allergies, Psychotropic Code Status Info: DNR Allergies Info: NKA Psychotropic Info: Zyprexa '5mg'$  daily at bed; Effexor '75mg'$  2x/day with meals         Current Medications (01/08/2022):  This is the current hospital active medication list Current Facility-Administered Medications  Medication Dose Route Frequency Provider Last Rate Last Admin   0.9 %  sodium chloride infusion  250 mL Intravenous Continuous Noemi Chapel P, DO 10 mL/hr at 01/05/22 1400 Infusion Verify at 01/05/22 1400   acetaminophen (TYLENOL) tablet 650 mg  650 mg Oral Q6H PRN Reome, Earle J, RPH       Chlorhexidine Gluconate Cloth 2 % PADS 6 each  6 each Topical Daily Anders Simmonds, MD   6 each at 01/08/22 1048   fentaNYL (SUBLIMAZE) injection 12.5-25  mcg  12.5-25 mcg Intravenous Q2H PRN Julian Hy, DO       heparin injection 5,000 Units  5,000 Units Subcutaneous Q8H Julian Hy, DO   5,000 Units at 01/08/22 7353   hydrALAZINE (APRESOLINE) injection 10 mg  10 mg Intravenous Q6H PRN Julian Hy, DO   10 mg at 01/05/22 0909   levETIRAcetam (KEPPRA) IVPB 500 mg/100 mL premix  500 mg Intravenous BID Julian Hy, DO 400 mL/hr at 01/08/22 1126 500 mg at 01/08/22 1126   losartan (COZAAR) tablet 50 mg  50 mg  Oral Daily Reome, Earle J, RPH   50 mg at 01/08/22 1120   OLANZapine (ZYPREXA) injection 5 mg  5 mg Intramuscular Q6H PRN Philis Pique, NP   5 mg at 01/06/22 1458   OLANZapine zydis (ZYPREXA) disintegrating tablet 5 mg  5 mg Oral QHS Philis Pique, NP   5 mg at 01/07/22 2248   Oral care mouth rinse  15 mL Mouth Rinse 4 times per day Dessa Phi, DO   15 mL at 01/08/22 1144   Oral care mouth rinse  15 mL Mouth Rinse PRN Dessa Phi, DO       pantoprazole sodium (PROTONIX) 40 mg/20 mL oral suspension 40 mg  40 mg Oral Daily Reome, Earle J, RPH   40 mg at 01/08/22 1120   piperacillin-tazobactam (ZOSYN) IVPB 3.375 g  3.375 g Intravenous Q8H ClarkMickel Baas P, DO 12.5 mL/hr at 01/08/22 1145 3.375 g at 01/08/22 1145   polyethylene glycol (MIRALAX / GLYCOLAX) packet 17 g  17 g Oral Daily Reome, Earle J, RPH   17 g at 01/08/22 1145   senna-docusate (Senokot-S) tablet 1 tablet  1 tablet Oral BID Reome, Earle J, RPH   1 tablet at 01/08/22 1120   venlafaxine (EFFEXOR) tablet 75 mg  75 mg Oral BID WC Reome, Earle J, RPH         Discharge Medications: Please see discharge summary for a list of discharge medications.  Relevant Imaging Results:  Relevant Lab Results:   Additional Information SS#: [id number]  Geralynn Ochs, LCSW

## 2022-01-08 NOTE — Progress Notes (Addendum)
Occupational Therapy Treatment Patient Details Name: Carly Larson MRN: [id number] DOB: Dec 21, 1950 Today's Date: 01/08/2022   History of present illness 71 y.o. female presents to [facility] hospital on 01/01/2022 after fall, with AMS. Pt required intubation due to hypercarbria on 6/12. CT head shows small IVH with T3 spinous process fx. Pt extubated 6/14.PMH includes anxiety, chiari malformation, depression, osteopenia, dementia.   OT comments  Pt progressing towards goals, able to complete seated grooming task with mod A, initially requiring hand over hand assist, fading as pt performed task. Pt mod A-mod A +2 for bed mobility, pt impulsively attempting to stand/scoot at EOB during session. Pt following ~75% of single step commands this session, needing increased time and cuing for redirection as pt easily distracted. Pt presenting with impairments listed below, will follow acutely. Updating d/c recommendation to SNF.   Recommendations for follow up therapy are one component of a multi-disciplinary discharge planning process, led by the attending physician.  Recommendations may be updated based on patient status, additional functional criteria and insurance authorization.    Follow Up Recommendations  Skilled nursing-short term rehab (<3 hours/day)    Assistance Recommended at Discharge Frequent or constant Supervision/Assistance  Patient can return home with the following  Two people to help with walking and/or transfers;Two people to help with bathing/dressing/bathroom;Assistance with cooking/housework;Assistance with feeding;Direct supervision/assist for medications management;Direct supervision/assist for financial management;Assist for transportation;Help with stairs or ramp for entrance   Equipment Recommendations  None recommended by OT (defer to next venue of care)    Recommendations for Other Services      Precautions / Restrictions Precautions Precautions: Fall Precaution Comments:  monitor SpO2 Restrictions Weight Bearing Restrictions: No       Mobility Bed Mobility Overal bed mobility: Needs Assistance Bed Mobility: Supine to Sit, Sit to Supine     Supine to sit: Mod assist, HOB elevated Sit to supine: Mod assist, +2 for physical assistance   General bed mobility comments: pt needing mod tactile and verbal cues to return to bed    Transfers                   General transfer comment: pt attempting to stand impulsively at EOB during session x3     Balance Overall balance assessment: Needs assistance Sitting-balance support: No upper extremity supported, Feet supported Sitting balance-Leahy Scale: Poor Sitting balance - Comments: requires close min guard due to impulsivity                                   ADL either performed or assessed with clinical judgement   ADL Overall ADL's : Needs assistance/impaired     Grooming: Wash/dry face;Wash/dry hands;Brushing hair;Sitting;Moderate assistance Grooming Details (indicate cue type and reason): hand over hand fading to supervision once pt understood task, has ROM to perform             Lower Body Dressing: Sitting/lateral leans;Cueing for safety;Moderate assistance Lower Body Dressing Details (indicate cue type and reason): reaching down to pull up sock             Functional mobility during ADLs: Moderate assistance      Extremity/Trunk Assessment Upper Extremity Assessment Upper Extremity Assessment: Overall WFL for tasks assessed   Lower Extremity Assessment Lower Extremity Assessment: Defer to PT evaluation        Vision   Additional Comments: will further assess   Perception Perception Perception: Not  tested   Praxis Praxis Praxis: Impaired Praxis Impairment Details: Ideation;Initiation;Ideomotor Praxis-Other Comments: when given comb, pt brushing therapist's palm, requires visual demo and hand over hand assist to perform    Cognition  Arousal/Alertness: Awake/alert Behavior During Therapy: Impulsive, Restless Overall Cognitive Status: History of cognitive impairments - at baseline Area of Impairment: Awareness, Problem solving, Safety/judgement, Following commands                 Orientation Level: Disoriented to, Place, Time, Situation Current Attention Level: Sustained Memory: Decreased recall of precautions, Decreased short-term memory Following Commands: Follows one step commands inconsistently Safety/Judgement: Decreased awareness of safety, Decreased awareness of deficits Awareness: Intellectual Problem Solving: Slow processing, Difficulty sequencing, Requires verbal cues, Requires tactile cues          Exercises      Shoulder Instructions       General Comments VSS on RA    Pertinent Vitals/ Pain       Pain Assessment Pain Assessment: Faces Pain Score: 3  Faces Pain Scale: Hurts a little bit Pain Location: LUE Pain Descriptors / Indicators: Discomfort Pain Intervention(s): Limited activity within patient's tolerance, Monitored during session, Repositioned  Home Living                                          Prior Functioning/Environment              Frequency  Min 2X/week        Progress Toward Goals  OT Goals(current goals can now be found in the care plan section)  Progress towards OT goals: Progressing toward goals  Acute Rehab OT Goals Patient Stated Goal: none stated OT Goal Formulation: With patient/family Time For Goal Achievement: 01/18/22 Potential to Achieve Goals: Fair ADL Goals Pt Will Perform Eating: with set-up;sitting Pt Will Perform Grooming: standing;with min guard assist Pt Will Transfer to Toilet: with min guard assist;ambulating;regular height toilet Pt Will Perform Toileting - Clothing Manipulation and hygiene: with min guard assist;sit to/from stand Pt Will Perform Tub/Shower Transfer: with min guard assist;Shower  transfer;ambulating  Plan Frequency remains appropriate;Discharge plan needs to be updated    Co-evaluation                 AM-PAC OT "6 Clicks" Daily Activity     Outcome Measure   Help from another person eating meals?: Total Help from another person taking care of personal grooming?: A Lot Help from another person toileting, which includes using toliet, bedpan, or urinal?: Total Help from another person bathing (including washing, rinsing, drying)?: Total Help from another person to put on and taking off regular upper body clothing?: Total Help from another person to put on and taking off regular lower body clothing?: Total 6 Click Score: 7    End of Session    OT Visit Diagnosis: Other symptoms and signs involving the nervous system (R29.898)   Activity Tolerance Patient tolerated treatment well   Patient Left in bed;with call bell/phone within reach;with bed alarm set;with restraints reapplied;with nursing/sitter in room   Nurse Communication Mobility status        Time: 4098-1191 OT Time Calculation (min): 36 min  Charges: OT General Charges $OT Visit: 1 Visit OT Treatments $Self Care/Home Management : 23-37 mins  Lynnda Child, OTD, OTR/L Acute Rehab [phone] - [place] 01/08/2022, 12:09 PM

## 2022-01-08 NOTE — Plan of Care (Signed)
  Problem: Clinical Measurements: Goal: Will remain free from infection Outcome: Progressing Goal: Respiratory complications will improve Outcome: Progressing Goal: Cardiovascular complication will be avoided Outcome: Progressing   Problem: Nutrition: Goal: Adequate nutrition will be maintained Outcome: Progressing   Problem: Elimination: Goal: Will not experience complications related to bowel motility Outcome: Progressing Goal: Will not experience complications related to urinary retention Outcome: Progressing   Problem: Pain Managment: Goal: General experience of comfort will improve Outcome: Progressing   Problem: Safety: Goal: Ability to remain free from injury will improve Outcome: Progressing   Problem: Education: Goal: Knowledge of General Education information will improve Description: Including pain rating scale, medication(s)/side effects and non-pharmacologic comfort measures Outcome: Not Progressing   Problem: Health Behavior/Discharge Planning: Goal: Ability to manage health-related needs will improve Outcome: Not Progressing   Problem: Clinical Measurements: Goal: Ability to maintain clinical measurements within normal limits will improve Outcome: Not Progressing Goal: Diagnostic test results will improve Outcome: Not Progressing   Problem: Activity: Goal: Risk for activity intolerance will decrease Outcome: Not Progressing   Problem: Coping: Goal: Level of anxiety will decrease Outcome: Not Progressing   Problem: Skin Integrity: Goal: Risk for impaired skin integrity will decrease Outcome: Not Progressing   Problem: Safety: Goal: Non-violent Restraint(s) Outcome: Not Progressing

## 2022-01-08 NOTE — Progress Notes (Addendum)
   I have reached out to the spouse Jeani Hawking to discuss hospice services and the pt's equipment needs. I have been unsuccessful in reaching him so far.   I was able to talk with the pt's daughter [name] this am. She reports that they are not sure what equipment will be needed at this time. She reports that she and her father have been discussing if the pt is bed bound and unable to assist in her care at home then they may be seeking a different living situation such as a SNF. We discussed hospice care at a SNF and that they would be paying out of pocket for the facility services. We also discussed that if the pt was able to do any rehab. She states that at this time she has not been able to participate and she is unsure if she would be eligible for rehab care. She is aware that if pt is able to participate in rehab and able to be skilled at  a facility that hospice care will not start until after she has completed rehab.    I have updated TOC as well on conversation. I have asked the daughter, [name] to update [place] as soon as possible so that if the decision is to take the pt home.With intent to have time to get equipment in place prior to d/c. Webb Silversmith RN [phone]

## 2022-01-08 NOTE — Care Management Important Message (Signed)
Important Message  Patient Details  Name: Carly Larson MRN: [id number] Date of Birth: 04-29-1951   Medicare Important Message Given:        Memory Argue 01/08/2022, 2:48 PM

## 2022-01-08 NOTE — Progress Notes (Signed)
Speech Language Pathology Treatment:    Patient Details Name: Carly Larson MRN: [id number] DOB: 1951/04/23 Today's Date: 01/08/2022 Time: 2202-5427 SLP Time Calculation (min) (ACUTE ONLY): 30 min  Assessment / Plan / Recommendation Clinical Impression  Pt is much more alert and interactive. Focused attention remains significantly impaired, impacting ability to follow directions and resulting in verbal/visual/tactile cueing needed to complete tasks.  Fortunately, swallowing has improved.  She shows no s/s of a biomechanical dysphagia. She was able to masticate crackers and purees; she swallowed sequential sips of water from a straw with no s/s of aspiration.  The primary barrier to eating will be cognition - she will need 1:1 assist to self-feed when able and to maintain attention in order to complete a meal. Recommend D/Cing cortrak and starting dysphagia 3/thin liquid diet. D/W RN and Dr. Maylene Roes. SLP will follow.   HPI HPI: Pt is a 71 yo female found down after unwitnessed fall. Pt was somnolent upon arrival and intubated in the ED (ETT 6/12-6/14). CT Head showed small IVH, C-spine with nondisplaced T3 spinous process fx, CT Chest with atelectasis vs. PNA. PMH includes: dementia, GERD, HTN, depression/anxiety, insomnia, chiari 1 malformation, lumbar spondylolisthesis s/p lumbar fusion (L3-L4 08/2018). Per chart husband assists with medication administration.      SLP Plan  Continue with current plan of care      Recommendations for follow up therapy are one component of a multi-disciplinary discharge planning process, led by the attending physician.  Recommendations may be updated based on patient status, additional functional criteria and insurance authorization.    Recommendations  Diet recommendations: Dysphagia 3 (mechanical soft);Thin liquid Liquids provided via: Straw;Cup Medication Administration: Whole meds with puree Supervision: Full supervision/cueing for compensatory  strategies Compensations: Minimize environmental distractions Postural Changes and/or Swallow Maneuvers: Seated upright 90 degrees                Oral Care Recommendations: Oral care BID Follow Up Recommendations: Skilled nursing-short term rehab (<3 hours/day) Assistance recommended at discharge: Frequent or constant Supervision/Assistance SLP Visit Diagnosis: Dysphagia, oropharyngeal phase (R13.12) Plan: Continue with current plan of care         Sherman Donaldson L. Tivis Ringer, MA CCC/SLP Clinical Specialist - Acute Care SLP Acute Rehabilitation Services Office number [phone]   Carly Larson  01/08/2022, 9:21 AM

## 2022-01-08 NOTE — Progress Notes (Signed)
PROGRESS NOTE    Carly Larson  ZOX:[id number] DOB: August 05, 1950 DOA: 01/01/2022 PCP: Cyndi Bender, PA-C     Brief Narrative:  Carly Larson is a 71 year old woman who presented to [facility] ED 6/12 after she was found down by her husband in the [place], s/p unwitnessed fall. PMHx significant for HTN, GERD, depression/anxiety, insomnia, dementia, Chiari 1 malformation, lumbar spondylolisthesis s/p lumbar fusion (L3-L4 08/2018). Per report, patient was more somnolent than usual prior to admission; she has dementia at baseline but is interactive/functional with assistance and confusion at times waxes/wanes. Husband assists her with ADLs including medication administration; she did not receive any additional doses. Reports that he found patient down in the [place] after a fall. Patient sustained a R lateral forehead laceration (repaired by EDP).    On ED arrival, patient was somnolent but was per report answering simple questions with EMS en route. Intubated for airway protection in the setting of somnolence/level 1 trauma. Vitals on arrival to ED demonstrated temp 97.6, tachycardia to 130s, mild hypertension, RR 25 and O2 sat 100%. Labs demonstrated mild leukocytosis with WBC 14.9, mild hyponatremia to 132, mild AKI with Cr 1.1 (baseline 0.8), normal INR 1.0. LA/PCT and BCx/UCx sent. Head/Neck imaging (CT Head, CT Maxillofacial, CT C-spine) demonstrated small acute IVH and nondisplaced fracture of T3 spinous process, otherwise negative. CT Chest/A/P demonstrated bilateral lower lobe/posteropr subpleural patchy subsegmental densities, ?atelectasis vs. pulmonary contusion vs PNA, otherwise negative for acute pathology. Trauma Surgery was consulted and recommended ICU admission. NSGY consulted for IVH and T3 spinous process fracture.  [facility] transferred to [facility] 6/17.   New events last 24 hours / Subjective: Patient alert and working with speech therapy.  Able to follow some command and  passed swallow screening.  Remains somewhat confused.  Assessment & Plan:    Principal Problem:   IVH (intraventricular hemorrhage) (HCC) Active Problems:   Dementia (HCC)   Fracture of spinous process of thoracic vertebra (HCC)   Chiari I malformation (HCC)   Acute metabolic encephalopathy   Acute respiratory failure with hypoxia and hypercapnia (HCC)   AKI (acute kidney injury) ([place])   HTN (hypertension)   Hypokalemia   DNR (do not resuscitate)   Acute IVH after fall at home Nondisplaced T3 spinous process fracture History of Chiari I malformation -Appreciate neurosurgery -Prophylactic Keppra for 7 days  Acute metabolic encephalopathy in setting of postconcussive plus IVH with baseline dementia -Continue Effexor, olanzapine -Stop Restoril -Improved.  She is advanced to dysphagia 3 diet.  Cortrak to be removed.  Acute hypercarbic and hypoxemic respiratory failure -Extubated 6/14 -Remains on room air -She is now a DNR/DNI -On Zosyn for 7 days for atelectasis versus pneumonia, question aspiration  AKI -Resolved  Hypertension -Cozaar    DVT prophylaxis:  heparin injection 5,000 Units Start: 01/03/22 2200 SCDs Start: 01/01/22 2237  Code Status: DNR Family Communication: Discussed with daughter over the phone Disposition Plan:  Status is: Inpatient Remains inpatient appropriate because: PT OT evaluation pending, suspect she would require SNF placement   Antimicrobials:  Anti-infectives (From admission, onward)    Start     Dose/Rate Route Frequency Ordered Stop   01/04/22 1600  piperacillin-tazobactam (ZOSYN) IVPB 3.375 g        3.375 g 12.5 mL/hr over 240 Minutes Intravenous Every 8 hours 01/04/22 0903 01/11/22 1559   01/04/22 1000  piperacillin-tazobactam (ZOSYN) IVPB 3.375 g        3.375 g 100 mL/hr over 30 Minutes Intravenous  Once  01/04/22 0900 01/04/22 1116   01/02/22 2200  cefTRIAXone (ROCEPHIN) 2 g in sodium chloride 0.9 % 100 mL IVPB  Status:   Discontinued        2 g 200 mL/hr over 30 Minutes Intravenous Every 24 hours 01/02/22 0826 01/04/22 0854   01/01/22 2200  cefTRIAXone (ROCEPHIN) 1 g in sodium chloride 0.9 % 100 mL IVPB        1 g 200 mL/hr over 30 Minutes Intravenous  Once 01/01/22 2153 01/02/22 0023   01/01/22 2200  azithromycin (ZITHROMAX) 500 mg in sodium chloride 0.9 % 250 mL IVPB        500 mg 250 mL/hr over 60 Minutes Intravenous  Once 01/01/22 2153 01/02/22 0335   01/01/22 2000  ceFAZolin (ANCEF) IVPB 2g/100 mL premix        2 g 200 mL/hr over 30 Minutes Intravenous  Once 01/01/22 1953 01/01/22 2114        Objective: Vitals:   01/07/22 2045 01/08/22 0100 01/08/22 0440 01/08/22 0726  BP:  (!) 153/68  (!) 168/70  Pulse: 100 100  (!) 109  Resp: '16 18  20  '$ Temp:  99.2 F (37.3 C)  99.1 F (37.3 C)  TempSrc:  Axillary  Oral  SpO2: 92% 95%  96%  Weight:   80.2 kg     Intake/Output Summary (Last 24 hours) at 01/08/2022 1153 Last data filed at 01/08/2022 0409 Gross per 24 hour  Intake 440 ml  Output 600 ml  Net -160 ml    Filed Weights   01/06/22 0556 01/07/22 0500 01/08/22 0440  Weight: 79.3 kg 81.9 kg 80.2 kg    Examination: General exam: Appears calm and comfortable  Respiratory system: Clear to auscultation. Respiratory effort normal. Cardiovascular system: S1 & S2 heard, RRR. No pedal edema. Gastrointestinal system: Abdomen is nondistended, soft and nontender. Normal bowel sounds heard. Central nervous system: Alert, remains confused and mumbles Extremities: Symmetric in appearance bilaterally  Skin: Right-sided forehead contusion   Data Reviewed: I have personally reviewed following labs and imaging studies  CBC: Recent Labs  Lab 01/01/22 1850 01/01/22 1857 01/01/22 2037 01/02/22 0255 01/04/22 0310 01/05/22 0413 01/07/22 0252  WBC 14.9*  --   --  15.7* 18.0* 13.7* 12.7*  NEUTROABS  --   --   --   --  14.2* 10.8*  --   HGB 14.8   < > 12.6 13.2  11.6* 11.9* 12.5 13.1  HCT 45.4    < > 37.0 40.4  34.0* 36.0 37.9 39.3  MCV 96.8  --   --  94.0 94.7 93.3 93.1  PLT 336  --   --  247 243 262 314   < > = values in this interval not displayed.    Basic Metabolic Panel: Recent Labs  Lab 01/02/22 0255 01/03/22 0109 01/04/22 0310 01/05/22 0413 01/06/22 0303 01/07/22 0252  NA 137  137 135 134* 139 139 142  K 4.7  3.9 3.9 3.9 3.7 3.2* 4.0  CL 100 103 103 108 105 110  CO2 '26 23 23 '$ 21* 23 21*  GLUCOSE 83 116* 96 87 136* 115*  BUN 6* 10 8 6* 9 14  CREATININE 1.03* 0.96 0.79 0.84 0.82 0.78  CALCIUM 8.8* 8.3* 8.1* 8.3* 8.7* 8.5*  MG 2.1 1.9 1.9 2.3 2.2 2.2  PHOS 3.2 2.6 3.8 2.3* 2.4*  --     GFR: CrCl cannot be calculated (Unknown ideal weight.). Liver Function Tests: Recent Labs  Lab 01/01/22 1850 01/04/22  0310  AST 40 33  ALT 26 18  ALKPHOS 150* 97  BILITOT 0.7 0.4  PROT 7.4 5.3*  ALBUMIN 4.1 2.5*    No results for input(s): "LIPASE", "AMYLASE" in the last 168 hours. No results for input(s): "AMMONIA" in the last 168 hours. Coagulation Profile: Recent Labs  Lab 01/01/22 1850  INR 1.0    Cardiac Enzymes: No results for input(s): "CKTOTAL", "CKMB", "CKMBINDEX", "TROPONINI" in the last 168 hours. BNP (last 3 results) No results for input(s): "PROBNP" in the last 8760 hours. HbA1C: No results for input(s): "HGBA1C" in the last 72 hours. CBG: Recent Labs  Lab 01/07/22 1731 01/07/22 2012 01/07/22 2354 01/08/22 0436 01/08/22 0724  GLUCAP 134* 130* 120* 136* 133*    Lipid Profile: No results for input(s): "CHOL", "HDL", "LDLCALC", "TRIG", "CHOLHDL", "LDLDIRECT" in the last 72 hours. Thyroid Function Tests: No results for input(s): "TSH", "T4TOTAL", "FREET4", "T3FREE", "THYROIDAB" in the last 72 hours. Anemia Panel: No results for input(s): "VITAMINB12", "FOLATE", "FERRITIN", "TIBC", "IRON", "RETICCTPCT" in the last 72 hours. Sepsis Labs: Recent Labs  Lab 01/01/22 2327 01/01/22 2332 01/02/22 0255 01/02/22 0819 01/02/22 1052   PROCALCITON <0.10  --   --   --   --   LATICACIDVEN  --  2.3* 2.9* 1.4 1.5     Recent Results (from the past 240 hour(s))  Resp Panel by RT-PCR (Flu A&B, Covid) Anterior Nasal Swab     Status: None   Collection Time: 01/01/22  9:33 PM   Specimen: Anterior Nasal Swab  Result Value Ref Range Status   SARS Coronavirus 2 by RT PCR NEGATIVE NEGATIVE Final    Comment: (NOTE) SARS-CoV-2 target nucleic acids are NOT DETECTED.  The SARS-CoV-2 RNA is generally detectable in upper respiratory specimens during the acute phase of infection. The lowest concentration of SARS-CoV-2 viral copies this assay can detect is 138 copies/mL. A negative result does not preclude SARS-Cov-2 infection and should not be used as the sole basis for treatment or other patient management decisions. A negative result may occur with  improper specimen collection/handling, submission of specimen other than nasopharyngeal swab, presence of viral mutation(s) within the areas targeted by this assay, and inadequate number of viral copies(<138 copies/mL). A negative result must be combined with clinical observations, patient history, and epidemiological information. The expected result is Negative.  Fact Sheet for Patients:  EntrepreneurPulse.com.au  Fact Sheet for Healthcare Providers:  IncredibleEmployment.be  This test is no t yet approved or cleared by the [place] FDA and  has been authorized for detection and/or diagnosis of SARS-CoV-2 by FDA under an Emergency Use Authorization (EUA). This EUA will remain  in effect (meaning this test can be used) for the duration of the COVID-19 declaration under Section 564(b)(1) of the Act, 21 U.S.C.section 360bbb-3(b)(1), unless the authorization is terminated  or revoked sooner.       Influenza A by PCR NEGATIVE NEGATIVE Final   Influenza B by PCR NEGATIVE NEGATIVE Final    Comment: (NOTE) The Xpert Xpress SARS-CoV-2/FLU/RSV  plus assay is intended as an aid in the diagnosis of influenza from Nasopharyngeal swab specimens and should not be used as a sole basis for treatment. Nasal washings and aspirates are unacceptable for Xpert Xpress SARS-CoV-2/FLU/RSV testing.  Fact Sheet for Patients: EntrepreneurPulse.com.au  Fact Sheet for Healthcare Providers: IncredibleEmployment.be  This test is not yet approved or cleared by the [place] FDA and has been authorized for detection and/or diagnosis of SARS-CoV-2 by FDA under an Emergency Use  Authorization (EUA). This EUA will remain in effect (meaning this test can be used) for the duration of the COVID-19 declaration under Section 564(b)(1) of the Act, 21 U.S.C. section 360bbb-3(b)(1), unless the authorization is terminated or revoked.  Performed at [facility], [place] [address] [place], [place] [address]   Blood culture (routine x 2)     Status: None   Collection Time: 01/01/22 10:22 PM   Specimen: BLOOD  Result Value Ref Range Status   Specimen Description BLOOD LEFT ANTECUBITAL  Final   Special Requests   Final    BOTTLES DRAWN AEROBIC AND ANAEROBIC Blood Culture results may not be optimal due to an excessive volume of blood received in culture bottles   Culture   Final    NO GROWTH 5 DAYS Performed at [facility], [place] [address], [place], [place] [address]    Report Status 01/06/2022 FINAL  Final  MRSA Next Gen by PCR, Nasal     Status: None   Collection Time: 01/02/22  1:17 AM   Specimen: Nasal Mucosa; Nasal Swab  Result Value Ref Range Status   MRSA by PCR Next Gen NOT DETECTED NOT DETECTED Final    Comment: (NOTE) The GeneXpert MRSA Assay (FDA approved for NASAL specimens only), is one component of a comprehensive MRSA colonization surveillance program. It is not intended to diagnose MRSA infection nor to guide or monitor treatment for MRSA infections. Test performance is not FDA approved in  patients less than 46 years old. Performed at [facility], [place] [address], [place], [place] [address]   Blood culture (routine x 2)     Status: None   Collection Time: 01/02/22  2:55 AM   Specimen: BLOOD  Result Value Ref Range Status   Specimen Description BLOOD RIGHT ANTECUBITAL  Final   Special Requests IN PEDIATRIC BOTTLE Blood Culture adequate volume  Final   Culture   Final    NO GROWTH 5 DAYS Performed at [facility], [place] [address] [place], [place] [address]    Report Status 01/07/2022 FINAL  Final  Culture, Respiratory w Gram Stain     Status: None   Collection Time: 01/02/22  8:48 AM   Specimen: Tracheal Aspirate; Respiratory  Result Value Ref Range Status   Specimen Description TRACHEAL ASPIRATE  Final   Special Requests NONE  Final   Gram Stain   Final    FEW WBC PRESENT, PREDOMINANTLY MONONUCLEAR NO ORGANISMS SEEN    Culture   Final    Normal respiratory flora-no Staph aureus or Pseudomonas seen Performed at [facility], [address] [place], [place] [address]    Report Status 01/04/2022 FINAL  Final      Radiology Studies: No results found.    Scheduled Meds:  Chlorhexidine Gluconate Cloth  6 each Topical Daily   heparin injection (subcutaneous)  5,000 Units Subcutaneous Q8H   losartan  50 mg Oral Daily   OLANZapine zydis  5 mg Oral QHS   mouth rinse  15 mL Mouth Rinse 4 times per day   pantoprazole sodium  40 mg Oral Daily   polyethylene glycol  17 g Oral Daily   senna-docusate  1 tablet Oral BID   venlafaxine  75 mg Oral BID WC   Continuous Infusions:  sodium chloride 10 mL/hr at 01/05/22 1400   levETIRAcetam 500 mg (01/08/22 1126)   piperacillin-tazobactam (ZOSYN)  IV 3.375 g (01/08/22 1145)     LOS:  7 days     Dessa Phi, DO [facility] 01/08/2022, 11:53 AM   Available via Epic secure chat 7am-7pm After these hours, please refer to coverage provider listed on [email or web address]

## 2022-01-08 NOTE — Progress Notes (Signed)
Physical Therapy Treatment Patient Details Name: Carly Larson MRN: [id number] DOB: November 01, 1950 Today's Date: 01/08/2022   History of Present Illness 71 y.o. female presents to [facility] hospital on 01/01/2022 after fall, with AMS. Pt required intubation due to hypercarbria on 6/12. CT head shows small IVH with T3 spinous process fx. Pt extubated 6/14. PMH includes anxiety, chiari malformation, depression, osteopenia, dementia.    PT Comments    Pt progressing towards physical therapy goals. With +2 assist, pt was able to ambulate to the sink to wash hands and wipe face off, and then ambulate back to the bed. Pt required max multimodal cues to sequence transition to EOB as well as for hand washing. Pt easily distractible and impulsive. Recommend SNF level rehab unless family can arrange adequate constant +2 assist at home. Will continue to follow and progress as able per POC.     Recommendations for follow up therapy are one component of a multi-disciplinary discharge planning process, led by the attending physician.  Recommendations may be updated based on patient status, additional functional criteria and insurance authorization.  Follow Up Recommendations  Skilled nursing-short term rehab (<3 hours/day)     Assistance Recommended at Discharge Frequent or constant Supervision/Assistance  Patient can return home with the following Two people to help with walking and/or transfers;Two people to help with bathing/dressing/bathroom;Assistance with feeding;Assistance with cooking/housework;Direct supervision/assist for medications management;Direct supervision/assist for financial management;Assist for transportation;Help with stairs or ramp for entrance   Equipment Recommendations  Hospital bed    Recommendations for Other Services       Precautions / Restrictions Precautions Precautions: Fall Precaution Comments: monitor SpO2 Restrictions Weight Bearing Restrictions: No     Mobility  Bed  Mobility Overal bed mobility: Needs Assistance Bed Mobility: Supine to Sit, Sit to Supine     Supine to sit: Mod assist, HOB elevated Sit to supine: Mod assist, +2 for physical assistance   General bed mobility comments: Multimodal cues to transition to/from EOB. Pt easily distracted by lines, linen, and gown.    Transfers Overall transfer level: Needs assistance Equipment used: 2 person hand held assist Transfers: Sit to/from Stand Sit to Stand: Mod assist, +2 safety/equipment           General transfer comment: Impulsively attempting to stand EOB, requiring heavy assist to gain/maintain standing balance initially.    Ambulation/Gait Ambulation/Gait assistance: Mod assist, +2 physical assistance, +2 safety/equipment Gait Distance (Feet): 5 Feet Assistive device: 2 person hand held assist Gait Pattern/deviations: Step-to pattern, Decreased stride length, Wide base of support Gait velocity: Decreased Gait velocity interpretation: <1.31 ft/sec, indicative of household ambulator   General Gait Details: Pt ambulated ~5 feet to the sink and then back to the bed. +2 assist required for balance support and safety, as pt with difficulty coordinating advancement of LE's to walk.   Stairs             Wheelchair Mobility    Modified Rankin (Stroke Patients Only)       Balance Overall balance assessment: Needs assistance Sitting-balance support: No upper extremity supported, Feet supported Sitting balance-Leahy Scale: Poor Sitting balance - Comments: requires close min guard due to impulsivity   Standing balance support: Bilateral upper extremity supported, Single extremity supported Standing balance-Leahy Scale: Poor                              Cognition Arousal/Alertness: Awake/alert Behavior During Therapy: Impulsive, Restless Overall Cognitive Status:  History of cognitive impairments - at baseline Area of Impairment: Awareness, Problem solving,  Safety/judgement, Following commands                 Orientation Level: Disoriented to, Place, Time, Situation Current Attention Level: Sustained Memory: Decreased recall of precautions, Decreased short-term memory Following Commands: Follows one step commands inconsistently Safety/Judgement: Decreased awareness of safety, Decreased awareness of deficits Awareness: Intellectual Problem Solving: Slow processing, Difficulty sequencing, Requires verbal cues, Requires tactile cues          Exercises      General Comments General comments (skin integrity, edema, etc.): VSS on RA      Pertinent Vitals/Pain Pain Assessment Pain Assessment: Faces Faces Pain Scale: Hurts a little bit Pain Location: Feet with donning socks, LUE Pain Descriptors / Indicators: Discomfort, Tender Pain Intervention(s): Limited activity within patient's tolerance, Monitored during session, Repositioned    Home Living                          Prior Function            PT Goals (current goals can now be found in the care plan section) Acute Rehab PT Goals Patient Stated Goal: to reduce falls risk PT Goal Formulation: With patient/family Time For Goal Achievement: 01/18/22 Potential to Achieve Goals: Fair Progress towards PT goals: Progressing toward goals    Frequency    Min 3X/week      PT Plan Current plan remains appropriate    Co-evaluation              AM-PAC PT "6 Clicks" Mobility   Outcome Measure  Help needed turning from your back to your side while in a flat bed without using bedrails?: A Little Help needed moving from lying on your back to sitting on the side of a flat bed without using bedrails?: A Lot Help needed moving to and from a bed to a chair (including a wheelchair)?: A Lot Help needed standing up from a chair using your arms (e.g., wheelchair or bedside chair)?: A Lot Help needed to walk in hospital room?: Total Help needed climbing 3-5 steps  with a railing? : Total 6 Click Score: 11    End of Session Equipment Utilized During Treatment: Oxygen Activity Tolerance: Patient tolerated treatment well Patient left: in bed;with call bell/phone within reach;with bed alarm set;with restraints reapplied Nurse Communication: Mobility status PT Visit Diagnosis: Other abnormalities of gait and mobility (R26.89);Muscle weakness (generalized) (M62.81);Other symptoms and signs involving the nervous system (R29.898)     Time: 1040-1109 PT Time Calculation (min) (ACUTE ONLY): 29 min  Charges:  $Gait Training: 8-22 mins $Therapeutic Activity: 8-22 mins                     Rolinda Roan, PT, DPT Acute Rehabilitation Services Secure Chat Preferred Office: [phone]    Thelma Comp 01/08/2022, 1:20 PM

## 2022-01-08 NOTE — Progress Notes (Signed)
RT attempted flutter valve. Pt kept pushing away flutter, trying to remove hand mits, and trying to get out of bed. Pt claims that she just wanted to take a nap. Rt will attempt CPT at a later time.

## 2022-01-09 DIAGNOSIS — I615 Nontraumatic intracerebral hemorrhage, intraventricular: Secondary | ICD-10-CM | POA: Diagnosis not present

## 2022-01-09 LAB — GLUCOSE, CAPILLARY
Glucose-Capillary: 87 mg/dL (ref 70–99)
Glucose-Capillary: 80 mg/dL (ref 70–99)
Glucose-Capillary: 102 mg/dL — ABNORMAL HIGH (ref 70–99)
Glucose-Capillary: 107 mg/dL — ABNORMAL HIGH (ref 70–99)
Glucose-Capillary: 140 mg/dL — ABNORMAL HIGH (ref 70–99)

## 2022-01-09 LAB — BASIC METABOLIC PANEL
Anion gap: 13 (ref 5–15)
BUN: 12 mg/dL (ref 8–23)
CO2: 22 mmol/L (ref 22–32)
Calcium: 9.4 mg/dL (ref 8.9–10.3)
Chloride: 106 mmol/L (ref 98–111)
Creatinine, Ser: 0.94 mg/dL (ref 0.44–1.00)
GFR, Estimated: >60 mL/min (ref >60)
Glucose, Bld: 97 mg/dL (ref 70–99)
Potassium: 4.1 mmol/L (ref 3.5–5.1)
Sodium: 141 mmol/L (ref 135–145)

## 2022-01-09 MED ORDER — POTASSIUM CHLORIDE 20 MEQ PO PACK
40.0000 meq | PACK | Freq: Once | ORAL | Status: AC
  Administered 2022-01-09: 40 meq
  Filled 2022-01-09: qty 2

## 2022-01-09 MED ORDER — DM-GUAIFENESIN ER 30-600 MG PO TB12
1.0000 | ORAL_TABLET | Freq: Two times a day (BID) | ORAL | Status: DC | PRN
  Administered 2022-01-09: 1 via ORAL
  Filled 2022-01-09: qty 1

## 2022-01-09 NOTE — Plan of Care (Signed)
  Problem: Clinical Measurements: Goal: Ability to maintain clinical measurements within normal limits will improve Outcome: Progressing Goal: Will remain free from infection Outcome: Progressing Goal: Diagnostic test results will improve Outcome: Progressing Goal: Cardiovascular complication will be avoided Outcome: Progressing   Problem: Nutrition: Goal: Adequate nutrition will be maintained Outcome: Progressing   Problem: Coping: Goal: Level of anxiety will decrease Outcome: Progressing   Problem: Elimination: Goal: Will not experience complications related to bowel motility Outcome: Progressing Goal: Will not experience complications related to urinary retention Outcome: Progressing   Problem: Pain Managment: Goal: General experience of comfort will improve Outcome: Progressing   Problem: Safety: Goal: Ability to remain free from injury will improve Outcome: Progressing   Problem: Skin Integrity: Goal: Risk for impaired skin integrity will decrease Outcome: Progressing   Problem: Safety: Goal: Non-violent Restraint(s) Outcome: Progressing   Problem: Education: Goal: Knowledge of General Education information will improve Description: Including pain rating scale, medication(s)/side effects and non-pharmacologic comfort measures Outcome: Not Progressing   Problem: Health Behavior/Discharge Planning: Goal: Ability to manage health-related needs will improve Outcome: Not Progressing   Problem: Clinical Measurements: Goal: Respiratory complications will improve Outcome: Not Progressing   Problem: Activity: Goal: Risk for activity intolerance will decrease Outcome: Not Progressing

## 2022-01-09 NOTE — Progress Notes (Signed)
TR[facility] night cross cover note:  I was notified by RN that this patient remains agitated, confused, attempting to independently get out of bed, with these behaviors refractory to attempts at verbal redirection. In the setting of this associated interference with ongoing medical treatment posing potential harm to herself, I have renewed existing orders for soft restraints in the form of soft waist belt.     Babs Bertin, DO Hospitalist

## 2022-01-09 NOTE — Progress Notes (Signed)
PROGRESS NOTE    Carly Larson  JHE:[id number] DOB: [birth year] DOA: 01/01/2022 PCP: Cyndi Bender, PA-C     Brief Narrative:  Carly Larson is a 71 year old woman who presented to [facility] ED 6/12 after she was found down by her husband in the 8, s/p unwitnessed fall. PMHx significant for HTN, GERD, depression/anxiety, insomnia, dementia, Chiari 1 malformation, lumbar spondylolisthesis s/p lumbar fusion (L3-L4 08/2018). Per report, patient was more somnolent than usual prior to admission; she has dementia at baseline but is interactive/functional with assistance and confusion at times waxes/wanes. Husband assists her with ADLs including medication administration; she did not receive any additional doses. Reports that he found patient down in the [place] after a fall. Patient sustained a R lateral forehead laceration (repaired by EDP).    On ED arrival, patient was somnolent but was per report answering simple questions with EMS en route. Intubated for airway protection in the setting of somnolence/level 1 trauma. Vitals on arrival to ED demonstrated temp 97.6, tachycardia to 130s, mild hypertension, RR 25 and O2 sat 100%. Labs demonstrated mild leukocytosis with WBC 14.9, mild hyponatremia to 132, mild AKI with Cr 1.1 (baseline 0.8), normal INR 1.0. LA/PCT and BCx/UCx sent. Head/Neck imaging (CT Head, CT Maxillofacial, CT C-spine) demonstrated small acute IVH and nondisplaced fracture of T3 spinous process, otherwise negative. CT Chest/A/P demonstrated bilateral lower lobe/posteropr subpleural patchy subsegmental densities, ?atelectasis vs. pulmonary contusion vs PNA, otherwise negative for acute pathology. Trauma Surgery was consulted and recommended ICU admission. NSGY consulted for IVH and T3 spinous process fracture.  [facility] transferred [facility] 6/17.   New events last 24 hours / Subjective: Patient sleeping this morning.  Assessment & Plan:    Principal Problem:    IVH (intraventricular hemorrhage) (HCC) Active Problems:   Dementia (HCC)   Fracture of spinous process of thoracic vertebra (HCC)   Chiari I malformation (HCC)   Acute metabolic encephalopathy   Acute respiratory failure with hypoxia and hypercapnia (HCC)   AKI (acute kidney injury) ([place])   HTN (hypertension)   Hypokalemia   DNR (do not resuscitate)   Acute IVH after fall at home Nondisplaced T3 spinous process fracture History of Chiari I malformation -Appreciate neurosurgery -Prophylactic Keppra for 7 days  Acute metabolic encephalopathy in setting of postconcussive plus IVH with baseline dementia -Continue Effexor, olanzapine -Stop Restoril -Improved.  She is advanced to dysphagia 3 diet.  Cortrak has been removed  Acute hypercarbic and hypoxemic respiratory failure -Extubated 6/14 -Remains on room air -She is now a DNR/DNI -On Zosyn for 7 days for atelectasis versus pneumonia, question aspiration  AKI -Resolved  Hypertension -Cozaar    DVT prophylaxis:  heparin injection 5,000 Units Start: 01/03/22 2200 SCDs Start: 01/01/22 2237  Code Status: DNR Family Communication: Discussed with daughter over the phone 6/19 Disposition Plan:  Status is: Inpatient Remains inpatient appropriate because: She will need SNF placement, family looking into home hospice once discharged   Antimicrobials:  Anti-infectives (From admission, onward)    Start     Dose/Rate Route Frequency Ordered Stop   01/04/22 1600  piperacillin-tazobactam (ZOSYN) IVPB 3.375 g        3.375 g 12.5 mL/hr over 240 Minutes Intravenous Every 8 hours 01/04/22 0903 01/11/22 1559   01/04/22 1000  piperacillin-tazobactam (ZOSYN) IVPB 3.375 g        3.375 g 100 mL/hr over 30 Minutes Intravenous  Once 01/04/22 0900 01/04/22 1116   01/02/22 2200  cefTRIAXone (ROCEPHIN) 2 g in  sodium chloride 0.9 % 100 mL IVPB  Status:  Discontinued        2 g 200 mL/hr over 30 Minutes Intravenous Every 24 hours 01/02/22  0826 01/04/22 0854   01/01/22 2200  cefTRIAXone (ROCEPHIN) 1 g in sodium chloride 0.9 % 100 mL IVPB        1 g 200 mL/hr over 30 Minutes Intravenous  Once 01/01/22 2153 01/02/22 0023   01/01/22 2200  azithromycin (ZITHROMAX) 500 mg in sodium chloride 0.9 % 250 mL IVPB        500 mg 250 mL/hr over 60 Minutes Intravenous  Once 01/01/22 2153 01/02/22 0335   01/01/22 2000  ceFAZolin (ANCEF) IVPB 2g/100 mL premix        2 g 200 mL/hr over 30 Minutes Intravenous  Once 01/01/22 1953 01/01/22 2114        Objective: Vitals:   01/08/22 1604 01/08/22 1937 01/09/22 0408 01/09/22 0711  BP: (!) 156/64 (!) 164/66 (!) 146/66 (!) 142/63  Pulse: 89 97 90 90  Resp: '14 16 16 18  '$ Temp: 99.1 F (37.3 C) 99.5 F (37.5 C) 99.1 F (37.3 C) 98 F (36.7 C)  TempSrc: Oral Oral Oral   SpO2: 100% 94% 99%   Weight:        Intake/Output Summary (Last 24 hours) at 01/09/2022 1047 Last data filed at 01/09/2022 0800 Gross per 24 hour  Intake 10095 ml  Output 900 ml  Net 9195 ml    Filed Weights   01/06/22 0556 01/07/22 0500 01/08/22 0440  Weight: 79.3 kg 81.9 kg 80.2 kg    Examination: General exam: Appears calm and comfortable, sleeping Respiratory system: Clear to auscultation. Respiratory effort normal. Cardiovascular system: S1 & S2 heard, RRR. No pedal edema. Gastrointestinal system: Abdomen is nondistended, soft  Extremities: Symmetric in appearance bilaterally  Skin: Right-sided forehead bruising   Data Reviewed: I have personally reviewed following labs and imaging studies  CBC: Recent Labs  Lab 01/04/22 0310 01/05/22 0413 01/07/22 0252  WBC 18.0* 13.7* 12.7*  NEUTROABS 14.2* 10.8*  --   HGB 11.9* 12.5 13.1  HCT 36.0 37.9 39.3  MCV 94.7 93.3 93.1  PLT 243 262 161    Basic Metabolic Panel: Recent Labs  Lab 01/03/22 0109 01/04/22 0310 01/05/22 0413 01/06/22 0303 01/07/22 0252  NA 135 134* 139 139 142  K 3.9 3.9 3.7 3.2* 4.0  CL 103 103 108 105 110  CO2 23 23 21* 23  21*  GLUCOSE 116* 96 87 136* 115*  BUN 10 8 6* 9 14  CREATININE 0.96 0.79 0.84 0.82 0.78  CALCIUM 8.3* 8.1* 8.3* 8.7* 8.5*  MG 1.9 1.9 2.3 2.2 2.2  PHOS 2.6 3.8 2.3* 2.4*  --     GFR: CrCl cannot be calculated (Unknown ideal weight.). Liver Function Tests: Recent Labs  Lab 01/04/22 0310  AST 33  ALT 18  ALKPHOS 97  BILITOT 0.4  PROT 5.3*  ALBUMIN 2.5*    No results for input(s): "LIPASE", "AMYLASE" in the last 168 hours. No results for input(s): "AMMONIA" in the last 168 hours. Coagulation Profile: No results for input(s): "INR", "PROTIME" in the last 168 hours.  Cardiac Enzymes: No results for input(s): "CKTOTAL", "CKMB", "CKMBINDEX", "TROPONINI" in the last 168 hours. BNP (last 3 results) No results for input(s): "PROBNP" in the last 8760 hours. HbA1C: No results for input(s): "HGBA1C" in the last 72 hours. CBG: Recent Labs  Lab 01/08/22 1602 01/08/22 1933 01/08/22 2334 01/09/22 0304 01/09/22 0960  GLUCAP 99 138* 96 87 80    Lipid Profile: No results for input(s): "CHOL", "HDL", "LDLCALC", "TRIG", "CHOLHDL", "LDLDIRECT" in the last 72 hours. Thyroid Function Tests: No results for input(s): "TSH", "T4TOTAL", "FREET4", "T3FREE", "THYROIDAB" in the last 72 hours. Anemia Panel: No results for input(s): "VITAMINB12", "FOLATE", "FERRITIN", "TIBC", "IRON", "RETICCTPCT" in the last 72 hours. Sepsis Labs: Recent Labs  Lab 01/02/22 1052  LATICACIDVEN 1.5     Recent Results (from the past 240 hour(s))  Resp Panel by RT-PCR (Flu A&B, Covid) Anterior Nasal Swab     Status: None   Collection Time: 01/01/22  9:33 PM   Specimen: Anterior Nasal Swab  Result Value Ref Range Status   SARS Coronavirus 2 by RT PCR NEGATIVE NEGATIVE Final    Comment: (NOTE) SARS-CoV-2 target nucleic acids are NOT DETECTED.  The SARS-CoV-2 RNA is generally detectable in upper respiratory specimens during the acute phase of infection. The lowest concentration of SARS-CoV-2 viral copies  this assay can detect is 138 copies/mL. A negative result does not preclude SARS-Cov-2 infection and should not be used as the sole basis for treatment or other patient management decisions. A negative result may occur with  improper specimen collection/handling, submission of specimen other than nasopharyngeal swab, presence of viral mutation(s) within the areas targeted by this assay, and inadequate number of viral copies(<138 copies/mL). A negative result must be combined with clinical observations, patient history, and epidemiological information. The expected result is Negative.  Fact Sheet for Patients:  EntrepreneurPulse.com.au  Fact Sheet for Healthcare Providers:  IncredibleEmployment.be  This test is no t yet approved or cleared by the [place] FDA and  has been authorized for detection and/or diagnosis of SARS-CoV-2 by FDA under an Emergency Use Authorization (EUA). This EUA will remain  in effect (meaning this test can be used) for the duration of the COVID-19 declaration under Section 564(b)(1) of the Act, 21 U.S.C.section 360bbb-3(b)(1), unless the authorization is terminated  or revoked sooner.       Influenza A by PCR NEGATIVE NEGATIVE Final   Influenza B by PCR NEGATIVE NEGATIVE Final    Comment: (NOTE) The Xpert Xpress SARS-CoV-2/FLU/RSV plus assay is intended as an aid in the diagnosis of influenza from Nasopharyngeal swab specimens and should not be used as a sole basis for treatment. Nasal washings and aspirates are unacceptable for Xpert Xpress SARS-CoV-2/FLU/RSV testing.  Fact Sheet for Patients: EntrepreneurPulse.com.au  Fact Sheet for Healthcare Providers: IncredibleEmployment.be  This test is not yet approved or cleared by the [place] FDA and has been authorized for detection and/or diagnosis of SARS-CoV-2 by FDA under an Emergency Use Authorization (EUA). This EUA  will remain in effect (meaning this test can be used) for the duration of the COVID-19 declaration under Section 564(b)(1) of the Act, 21 U.S.C. section 360bbb-3(b)(1), unless the authorization is terminated or revoked.  Performed at [facility], [place] [address] [place], [place] [address]   Blood culture (routine x 2)     Status: None   Collection Time: 01/01/22 10:22 PM   Specimen: BLOOD  Result Value Ref Range Status   Specimen Description BLOOD LEFT ANTECUBITAL  Final   Special Requests   Final    BOTTLES DRAWN AEROBIC AND ANAEROBIC Blood Culture results may not be optimal due to an excessive volume of blood received in culture bottles   Culture   Final    NO GROWTH 5 DAYS Performed at [facility], [place],  [place] [address]    Report Status 01/06/2022 FINAL  Final  MRSA Next Gen by PCR, Nasal     Status: None   Collection Time: 01/02/22  1:17 AM   Specimen: Nasal Mucosa; Nasal Swab  Result Value Ref Range Status   MRSA by PCR Next Gen NOT DETECTED NOT DETECTED Final    Comment: (NOTE) The GeneXpert MRSA Assay (FDA approved for NASAL specimens only), is one component of a comprehensive MRSA colonization surveillance program. It is not intended to diagnose MRSA infection nor to guide or monitor treatment for MRSA infections. Test performance is not FDA approved in patients less than 101 years old. Performed at [facility], [place] [address] [place], [place] [address]   Blood culture (routine x 2)     Status: None   Collection Time: 01/02/22  2:55 AM   Specimen: BLOOD  Result Value Ref Range Status   Specimen Description BLOOD RIGHT ANTECUBITAL  Final   Special Requests IN PEDIATRIC BOTTLE Blood Culture adequate volume  Final   Culture   Final    NO GROWTH 5 DAYS Performed at [facility], [place] [address] [place], [place] [address]    Report Status 01/07/2022 FINAL  Final  Culture, Respiratory w Gram Stain     Status: None    Collection Time: 01/02/22  8:48 AM   Specimen: Tracheal Aspirate; Respiratory  Result Value Ref Range Status   Specimen Description TRACHEAL ASPIRATE  Final   Special Requests NONE  Final   Gram Stain   Final    FEW WBC PRESENT, PREDOMINANTLY MONONUCLEAR NO ORGANISMS SEEN    Culture   Final    Normal respiratory flora-no Staph aureus or Pseudomonas seen Performed at [facility], [address], [place],  [address]    Report Status 01/04/2022 FINAL  Final      Radiology Studies: No results found.    Scheduled Meds:  Chlorhexidine Gluconate Cloth  6 each Topical Daily   heparin injection (subcutaneous)  5,000 Units Subcutaneous Q8H   losartan  50 mg Oral Daily   OLANZapine zydis  5 mg Oral QHS   mouth rinse  15 mL Mouth Rinse 4 times per day   pantoprazole sodium  40 mg Oral Daily   polyethylene glycol  17 g Oral Daily   senna-docusate  1 tablet Oral BID   venlafaxine  75 mg Oral BID WC   Continuous Infusions:  sodium chloride 10 mL/hr at 01/05/22 1400   piperacillin-tazobactam (ZOSYN)  IV 3.375 g (01/09/22 0904)     LOS: 8 days     Dessa Phi, DO [facility] 01/09/2022, 10:47 AM   Available via Epic secure chat 7am-7pm After these hours, please refer to coverage provider listed on [email or web address]

## 2022-01-09 NOTE — Progress Notes (Signed)
Pt not available for CPT this AM. RT will attempt at next scheduled time.

## 2022-01-10 ENCOUNTER — Other Ambulatory Visit: Payer: Self-pay

## 2022-01-10 DIAGNOSIS — Z66 Do not resuscitate: Secondary | ICD-10-CM | POA: Diagnosis not present

## 2022-01-10 DIAGNOSIS — Z515 Encounter for palliative care: Secondary | ICD-10-CM | POA: Diagnosis not present

## 2022-01-10 DIAGNOSIS — Z7189 Other specified counseling: Secondary | ICD-10-CM | POA: Diagnosis not present

## 2022-01-10 DIAGNOSIS — I615 Nontraumatic intracerebral hemorrhage, intraventricular: Secondary | ICD-10-CM | POA: Diagnosis not present

## 2022-01-10 LAB — CBC
HCT: 40.9 % (ref 36.0–46.0)
Hemoglobin: 13.1 g/dL (ref 12.0–15.0)
MCH: 30.2 pg (ref 26.0–34.0)
MCHC: 32 g/dL (ref 30.0–36.0)
MCV: 94.2 fL (ref 80.0–100.0)
Platelets: 390 10*3/uL (ref 150–400)
RBC: 4.34 MIL/uL (ref 3.87–5.11)
RDW: 14 % (ref 11.5–15.5)
WBC: 15 10*3/uL — ABNORMAL HIGH (ref 4.0–10.5)
nRBC: 0.1 % (ref 0.0–0.2)

## 2022-01-10 LAB — BASIC METABOLIC PANEL
Anion gap: 12 (ref 5–15)
BUN: 10 mg/dL (ref 8–23)
CO2: 21 mmol/L — ABNORMAL LOW (ref 22–32)
Calcium: 9.3 mg/dL (ref 8.9–10.3)
Chloride: 106 mmol/L (ref 98–111)
Creatinine, Ser: 1.07 mg/dL — ABNORMAL HIGH (ref 0.44–1.00)
GFR, Estimated: 55 mL/min — ABNORMAL LOW (ref >60)
Glucose, Bld: 90 mg/dL (ref 70–99)
Potassium: 4.5 mmol/L (ref 3.5–5.1)
Sodium: 139 mmol/L (ref 135–145)

## 2022-01-10 LAB — GLUCOSE, CAPILLARY
Glucose-Capillary: 103 mg/dL — ABNORMAL HIGH (ref 70–99)
Glucose-Capillary: 89 mg/dL (ref 70–99)

## 2022-01-10 MED ORDER — MIRTAZAPINE 15 MG PO TABS
15.0000 mg | ORAL_TABLET | Freq: Every day | ORAL | Status: DC
  Administered 2022-01-10 – 2022-01-11 (×2): 15 mg via ORAL
  Filled 2022-01-10 (×2): qty 1

## 2022-01-10 MED ORDER — MELATONIN 3 MG PO TABS
3.0000 mg | ORAL_TABLET | Freq: Every day | ORAL | Status: DC
  Administered 2022-01-10 – 2022-01-11 (×2): 3 mg via ORAL
  Filled 2022-01-10 (×2): qty 1

## 2022-01-10 NOTE — Progress Notes (Signed)
Physical Therapy Treatment Patient Details Name: Carly Larson MRN: [id number] DOB: May 09, 1951 Today's Date: 01/10/2022   History of Present Illness 71 y.o. female presents to [facility] hospital on 01/01/2022 after fall, with AMS. Pt required intubation due to hypercarbria on 6/12. CT head shows small IVH with T3 spinous process fx. Pt extubated 6/14. PMH includes anxiety, chiari malformation, depression, osteopenia, dementia.    PT Comments    Pt progressing slowly towards physical therapy goals. Pt internally distracted, and motor planning, sequencing and attention significantly decreased this session. We were not able to transition away from EOB as pt getting agitated and attempting to hit staff. Will continue to follow.    Recommendations for follow up therapy are one component of a multi-disciplinary discharge planning process, led by the attending physician.  Recommendations may be updated based on patient status, additional functional criteria and insurance authorization.  Follow Up Recommendations  Skilled nursing-short term rehab (<3 hours/day)     Assistance Recommended at Discharge Frequent or constant Supervision/Assistance  Patient can return home with the following Two people to help with walking and/or transfers;Two people to help with bathing/dressing/bathroom;Assistance with feeding;Assistance with cooking/housework;Direct supervision/assist for medications management;Direct supervision/assist for financial management;Assist for transportation;Help with stairs or ramp for entrance   Equipment Recommendations  Hospital bed    Recommendations for Other Services       Precautions / Restrictions Precautions Precautions: Fall Precaution Comments: monitor SpO2 Restrictions Weight Bearing Restrictions: No     Mobility  Bed Mobility Overal bed mobility: Needs Assistance Bed Mobility: Supine to Sit, Sit to Supine     Supine to sit: Mod assist, HOB elevated Sit to supine: Mod  assist, +2 for physical assistance   General bed mobility comments: Multimodal cues to transition to/from EOB. Pt easily distracted by lines, linen, and gown.    Transfers Overall transfer level: Needs assistance Equipment used: 2 person hand held assist Transfers: Sit to/from Stand Sit to Stand: Mod assist, +2 safety/equipment           General transfer comment: Multimodal cues for hand placement on seated surface, and then for use of walker. Very distracted by lines and gait belt. Attempted stand x2 with pt complaining of pain in R foot, stating she was stepping on something on the floor. On third attempt pt stood fairly well without complaints of pain.    Ambulation/Gait Ambulation/Gait assistance: Mod assist, +2 physical assistance, +2 safety/equipment Gait Distance (Feet): 1 Feet   Gait Pattern/deviations: Step-to pattern     Pre-gait activities: Pt was able to take 2 side steps up towards EOB but unable to progress gait farther, mainly due to decreased cognition.     Stairs             Wheelchair Mobility    Modified Rankin (Stroke Patients Only)       Balance Overall balance assessment: Needs assistance Sitting-balance support: No upper extremity supported, Feet supported Sitting balance-Leahy Scale: Poor Sitting balance - Comments: requires close min guard due to impulsivity   Standing balance support: Bilateral upper extremity supported, Single extremity supported Standing balance-Leahy Scale: Poor Standing balance comment: minA, mild posterior lean against bed                            Cognition Arousal/Alertness: Awake/alert Behavior During Therapy: Impulsive, Restless, Agitated (Tried to hit during our session) Overall Cognitive Status: History of cognitive impairments - at baseline Area of Impairment: Awareness, Problem  solving, Safety/judgement, Following commands                 Orientation Level: Disoriented to, Place,  Time, Situation Current Attention Level: Sustained Memory: Decreased recall of precautions, Decreased short-term memory Following Commands: Follows one step commands inconsistently Safety/Judgement: Decreased awareness of safety, Decreased awareness of deficits Awareness: Intellectual Problem Solving: Slow processing, Difficulty sequencing, Requires verbal cues, Requires tactile cues General Comments: difficult to assess due to communication deficits        Exercises      General Comments        Pertinent Vitals/Pain Pain Assessment Pain Assessment: Faces Faces Pain Scale: Hurts little more Pain Location: LLE/foot with attempts at weight bearing Pain Descriptors / Indicators: Tender Pain Intervention(s): Limited activity within patient's tolerance, Monitored during session, Repositioned    Home Living Family/patient expects to be discharged to:: Private residence Living Arrangements: Spouse/significant other Available Help at Discharge: Family;Available 24 hours/day Type of Home: House Home Access: Level entry       Home Layout: One level Home Equipment: Shower seat      Prior Function            PT Goals (current goals can now be found in the care plan section) Acute Rehab PT Goals Patient Stated Goal: "I'm ready to go home" PT Goal Formulation: With patient/family Time For Goal Achievement: 01/18/22 Potential to Achieve Goals: Fair Progress towards PT goals: Not progressing toward goals - comment    Frequency    Min 3X/week      PT Plan Current plan remains appropriate    Co-evaluation              AM-PAC PT "6 Clicks" Mobility   Outcome Measure  Help needed turning from your back to your side while in a flat bed without using bedrails?: A Little Help needed moving from lying on your back to sitting on the side of a flat bed without using bedrails?: A Lot Help needed moving to and from a bed to a chair (including a wheelchair)?: A Lot Help  needed standing up from a chair using your arms (e.g., wheelchair or bedside chair)?: A Lot Help needed to walk in hospital room?: Total Help needed climbing 3-5 steps with a railing? : Total 6 Click Score: 11    End of Session Equipment Utilized During Treatment: Oxygen Activity Tolerance: Patient tolerated treatment well Patient left: in bed;with call bell/phone within reach;with bed alarm set (Bed in chair position, and 4 rails up for safety due to positioning) Nurse Communication: Mobility status PT Visit Diagnosis: Other abnormalities of gait and mobility (R26.89);Muscle weakness (generalized) (M62.81);Other symptoms and signs involving the nervous system (R29.898)     Time: 6834-1962 PT Time Calculation (min) (ACUTE ONLY): 22 min  Charges:  $Therapeutic Activity: 8-22 mins                     Rolinda Roan, PT, DPT Acute Rehabilitation Services Secure Chat Preferred Office: [phone]    Thelma Comp 01/10/2022, 3:39 PM

## 2022-01-10 NOTE — Progress Notes (Signed)
Daily Progress Note   Patient Name: Carly Larson       Date: 01/10/2022 DOB: 1950/09/20  Age: 71 y.o. MRN#: [id number] Attending Physician: [place] Tyrell Seifer J, DO Primary Care Physician: Cyndi Bender, PA-C Admit Date: 01/01/2022  Reason for Consultation/Follow-up: Establishing goals of care  Subjective: Carly Larson is awake and appears comfortable. Husband is at the bedside.  She is able to answer my questions.  She is oriented x1.  She is still quite confused.  Husband states that she has eaten some and we congratulated this.  I encouraged her to continue eating and encouraged her husband to continue to try to to encourage her to eat.  We discussed that we likely are still days away from knowing exactly how this is going to play out.  I reinforced the plan to see how much she can eat, see if PT/OT can work with her to the point that she be able to go home with her husband and hospice services.  Any alternative if she is not eating and/or clinically declining and she may be a candidate for residential hospice.  I provided emotional general support through therapeutic listening, empathy, sharing stories, and other techniques.  Answered all questions and addressed all concerns to the best my ability.  Length of Stay: 9  Current Medications: Scheduled Meds:   Chlorhexidine Gluconate Cloth  6 each Topical Daily   heparin injection (subcutaneous)  5,000 Units Subcutaneous Q8H   losartan  50 mg Oral Daily   OLANZapine zydis  5 mg Oral QHS   mouth rinse  15 mL Mouth Rinse 4 times per day   pantoprazole sodium  40 mg Oral Daily   polyethylene glycol  17 g Oral Daily   senna-docusate  1 tablet Oral BID   venlafaxine  75 mg Oral BID WC    Continuous Infusions:  sodium chloride 10 mL/hr at 01/05/22 1400    piperacillin-tazobactam (ZOSYN)  IV 3.375 g (01/10/22 0830)    PRN Meds: acetaminophen, dextromethorphan-guaiFENesin, fentaNYL (SUBLIMAZE) injection, hydrALAZINE, OLANZapine, mouth rinse  Physical Exam Constitutional:      General: She is not in acute distress.    Appearance: She is ill-appearing.  HENT:     Head: Normocephalic and atraumatic.  Cardiovascular:     Rate and Rhythm: Normal rate.  Pulmonary:     Effort: Pulmonary effort is normal.  Abdominal:     General: Abdomen is flat. Bowel sounds are normal.     Palpations: Abdomen is soft.  Skin:    General: Skin is warm and dry.  Neurological:     Mental Status: She is alert. She is confused.  Psychiatric:        Mood and Affect: Mood normal.        Behavior: Behavior normal.             Vital Signs: BP (!) 150/63 (BP Location: Right Arm)   Pulse 84   Temp 98.7 F (37.1 C) (Oral)   Resp 18   Wt 80.2 kg   SpO2 96%   BMI 34.53 kg/m  SpO2: SpO2: 96 % O2 Device: O2 Device: Room Air O2 Flow Rate: O2 Flow Rate (L/min): (S) 4 L/min  Intake/output summary:  Intake/Output Summary (Last 24 hours) at 01/10/2022 1344 Last data filed at 01/10/2022 0800 Gross per 24 hour  Intake 60 ml  Output 700 ml  Net -640 ml    LBM: Last BM Date : 01/09/22 Baseline Weight: Weight: 79.2 kg Most recent weight: Weight: 80.2 kg       Palliative Assessment/Data: PPS 40%    Flowsheet Rows    Flowsheet Row Most Recent Value  Intake Tab   Referral Department Critical care  Unit at Time of Referral ICU  Palliative Care Primary Diagnosis Neurology  Date Notified 01/03/22  Palliative Care Type New Palliative care  Reason for referral Clarify Goals of Care  Date of Admission 01/02/22  Date first seen by Palliative Care 01/03/22  # of days Palliative referral response time 0 Day(s)  # of days IP prior to Palliative referral 1  Clinical Assessment   Psychosocial & Spiritual Assessment   Palliative Care Outcomes         Patient Active Problem List   Diagnosis Date Noted   Fracture of spinous process of thoracic vertebra ([place]) 01/06/2022   Chiari I malformation ([place]) 17/49/4496   Acute metabolic encephalopathy 75/91/6384   Acute respiratory failure with hypoxia and hypercapnia ([place]) 01/06/2022   AKI (acute kidney injury) ([place]) 01/06/2022   HTN (hypertension) 01/06/2022   Hypokalemia 01/06/2022   DNR (do not resuscitate) 01/06/2022   IVH (intraventricular hemorrhage) ([place]) 01/01/2022   Spondylolisthesis, lumbar region 09/10/2018   Spinal stenosis of lumbar region with neurogenic claudication 09/10/2018   Anemia due to acute blood loss 09/10/2018   Fusion of spine of lumbar region 09/09/2018   Dementia ([place]) 12/23/2017   Syncope 12/23/2017   Heart murmur 12/23/2017   Unilateral primary osteoarthritis, right knee 10/23/2017   Unilateral primary osteoarthritis, left knee 09/23/2017   Chronic pain of left knee 08/08/2017   Pain in right knee 08/08/2017   Sciatica, left side 08/08/2017   Memory loss 12/03/2013    Palliative Care Assessment & Plan   HPI: 71 y.o. female  with past medical history of HTN, GERD, depression/anxiety, insomnia, dementia, Chiari 1 malformation, and lumbar spondylolisthesis s/p lumbar fusion (L3-L4 08/2018) admitted on 01/01/2022 with s/p unwitnessed fall.  Patient initially required intubation for airway protection but was extubated 6/14.  Patient was diagnosed with acute IVH due to fall.  Also found to have T3 spinous process fracture.  PMT consulted to discuss goals of care.  Assessment: More awake, less agitated on scheduled Zyprexa. I think this is a good ongoing option for her.  I re-emphasized the current plan: for the patient to be able to go to rehab she would need to get up, interact with PT, participate in therapy, be able to eat on her own to sustain nutrition.  We discussed improvement in agitation to try to reach that fine-line between oversedated/unable to  participate/eat versus agitated.  It seems we may be meeting this middle ground at this point.  In her current state as nonmobile, bedbound, not able to eat enough she is not able to be cared for at home.  The patient's husband has some dementia himself and the patient's daughter lives over an hour away.  We discussed continuing to encourage oral intake, therapy to see how good we can get. I previously brought up the topic of PEG tube and she states that she and her father spoke and the patient would absolutely not want a PEG tube.  Given this, she may be appropriate  for residential hospice if she is not eating or drinking enough to sustain herself OR if she clinically declines.  Recommendations/Plan: Initially the main goal was for patient to return home with her husband and receive hospice support at home, however substantial concern about ability for husband to provide 24 hour care if bedbound/not eating. Continue to encourage oral intake and therapy (PT/OT) NO PEG TUBE Continue Zyprexa every night 5 mg sublingual, risks/benefits discussed with daughter who agrees with trial (patient received 10 mg prn dose 6/16 and was too sedated following dose - reduced scheduled and PRN dose to 5 MG Time for outcomes Standard delirium management (adapted from NICE guidelines 2011 for prevention of delirium):  Provide continuity of care when possible (avoid frequent changing of surroundings and staff).  Frequent reorientation to time with:  A clock should always be visible.  Make sure Calendar/white board is updated.  Lights on/blinds open during the day and off/closed at night.  Encourage frequent family visits.  Monitor for and treat dehydration/constipation.  Optimize oxygen saturation.  Avoid catheters and IV's when possible and look for/treat infections.  Encourage early mobility.  Assess and treat pain  Ensure adequate nutrition and functioning dentures.  Address reversible causes of hearing and  visual impairment:  Use pocket talker if hearing aids are unavailable.  Avoid sleep disturbance (normalize sleep/wake cycle).  Minimize disturbances and consider NOT obtaining vitals at night if possible.  Review Medications to avoid polypharmacy and avoid deliriogenic medications when possible:  Benzodiazepines  Dihydropyridines.  Antihistamines.  Anticholinergics   (Possibly avoid: H2 blockers, tricyclic antidepressants, antiparkinson medications, steroids, NSAID's).   Code Status: DNR  Discharge Planning: To Be Determined  Care plan was discussed with RN, patient's daughter [name]  Thank you for allowing the Palliative Medicine Team to assist in the care of this patient.  Billing based on MDM: High  Problems Addressed: One acute or chronic illness or injury that poses a threat to life or bodily function  Amount and/or Complexity of Data: Category 3:Discussion of management or test interpretation with external physician/other qualified health care professional/appropriate source (not separately reported)  Risks: Decision not to resuscitate or to de-escalate care because of poor prognosis   [place], DNP, [facility] Palliative Medicine Team Team Phone # [phone]  Pager [phone]

## 2022-01-10 NOTE — Care Management Important Message (Signed)
Important Message  Patient Details  Name: Carly Larson MRN: [id number] Date of Birth: [birth year]   Medicare Important Message Given:  Yes     Carly Larson [address] 01/10/2022, 10:31 AM

## 2022-01-10 NOTE — TOC Initial Note (Signed)
Transition of Care [facility]) - Initial/Assessment Note    Patient Details  Name: Carly Larson MRN: [id number] Date of Birth: 1951/01/22  [facility] of [facility]) CM/SW Contact:    Geralynn Ochs, LCSW Phone Number: 01/10/2022, 4:36 PM  Clinical Narrative:                CSW spoke with both patient's daughter and spouse about SNF placement, both in agreement. CSW discussed options available, and contacted SNF options near daughter's address in [place], as well. Spouse asked about changing patient's insurance for better options but they are unable to do so until open enrollment in the fall. Family to review SNF options and will update CSW with choice.   Expected Discharge Plan: Skilled Nursing Facility Barriers to Discharge: Continued Medical Work up, [facility] will not accept until restraint criteria met   Patient Goals and CMS Choice Patient states their goals for this hospitalization and ongoing recovery are:: patient unable to participate in goal setting, not oriented CMS Medicare.gov Compare Post Acute Care list provided to:: Patient Represenative (must comment) Choice offered to / list presented to : Spouse, Adult Children  Expected Discharge Plan and Services Expected Discharge Plan: [place] Acute Care Choice: [place] Living arrangements for the past 2 months: Single Family Home                                      Prior Living Arrangements/Services Living arrangements for the past 2 months: Single Family Home Lives with:: Spouse Patient language and need for interpreter reviewed:: No Do you feel safe going back to the place where you live?: Yes      Need for Family Participation in Patient Care: Yes (Comment) Care giver support system in place?: No (comment)   Criminal Activity/Legal Involvement Pertinent to Current Situation/Hospitalization: No - Comment as needed  Activities of Daily  Living      Permission Sought/Granted Permission sought to share information with : Facility Sport and exercise psychologist, Family Supports Permission granted to share information with : Yes, Verbal Permission Granted  Share Information with NAME: Alean Rinne  Permission granted to share info w AGENCY: SNF  Permission granted to share info w Relationship: Spouse, Daughter     Emotional Assessment Appearance:: Appears stated age Attitude/Demeanor/Rapport: Unable to Assess Affect (typically observed): Unable to Assess Orientation: : Oriented to Self Alcohol / Substance Use: Not Applicable Psych Involvement: No (comment)  Admission diagnosis:  IVH (intraventricular hemorrhage) ([place]) [I61.5] Fall, initial encounter [W19.XXXA] Other closed fracture of third thoracic vertebra, initial encounter [facility]) [S22.038A] Patient Active Problem List   Diagnosis Date Noted   Fracture of spinous process of thoracic vertebra ([place]) 01/06/2022   Chiari I malformation ([place]) 83/15/1761   Acute metabolic encephalopathy 60/73/7106   Acute respiratory failure with hypoxia and hypercapnia ([place]) 01/06/2022   AKI (acute kidney injury) ([place]) 01/06/2022   HTN (hypertension) 01/06/2022   Hypokalemia 01/06/2022   DNR (do not resuscitate) 01/06/2022   IVH (intraventricular hemorrhage) ([place]) 01/01/2022   Spondylolisthesis, lumbar region 09/10/2018    Class: Chronic   Spinal stenosis of lumbar region with neurogenic claudication 09/10/2018    Class: Chronic   Anemia due to acute blood loss 09/10/2018   Fusion of spine of lumbar region 09/09/2018   Dementia ([place]) 12/23/2017   Syncope 12/23/2017   Heart murmur 12/23/2017   Unilateral primary  osteoarthritis, right knee 10/23/2017   Unilateral primary osteoarthritis, left knee 09/23/2017   Chronic pain of left knee 08/08/2017   Pain in right knee 08/08/2017   Sciatica, left side 08/08/2017   Memory loss 12/03/2013   PCP:  Cyndi Bender, PA-C Pharmacy:    [facility] [place], [address] [place] [address] Phone: [phone] Fax: [phone]     Social Determinants of Health (SDOH) Interventions    Readmission Risk Interventions     No data to display

## 2022-01-10 NOTE — Progress Notes (Addendum)
PROGRESS NOTE    Carly KILNER  Larson:[id number] DOB: 1950-09-14 DOA: 01/01/2022 PCP: Cyndi Bender, PA-C    Brief Narrative:   Carly Larson is a 71 y.o. female with past medical history significant for HTN, GERD, depression/anxiety, dementia, Chari 1 malformation, lumbar spondylolisthesis s/p fusion (L3-4 08/2018), insomnia who presented to [facility] ED on 6/12 via EMS following an unwitnessed fall at home.  Patient's husband reports that she was found outside in the carport with a laceration on the right side above her eye.  History obtained from chart, as husband/family not available and patient is intubated/sedated. Per report, patient was more somnolent than usual prior to admission; she has dementia at baseline but is interactive/functional with assistance and confusion at times waxes/wanes. Husband assists her with ADLs including medication administration; she did not receive any additional doses. Reports that he found patient down in the [place] after a fall. Patient sustained a R lateral forehead laceration (repaired by EDP).    On ED arrival, patient was somnolent but was per report answering simple questions with EMS en route. Intubated for airway protection in the setting of somnolence/level 1 trauma. Vitals on arrival to ED demonstrated temp 97.6, tachycardia to 130s, mild hypertension, RR 25 and O2 sat 100%. Labs demonstrated mild leukocytosis with WBC 14.9, mild hyponatremia to 132, mild AKI with Cr 1.1 (baseline 0.8), normal INR 1.0. LA/PCT and BCx/UCx sent. Head/Neck imaging (CT Head, CT Maxillofacial, CT C-spine) demonstrated small acute IVH and nondisplaced fracture of T3 spinous process, otherwise negative. CT Chest/A/P demonstrated bilateral lower lobe/posteropr subpleural patchy subsegmental densities, ?atelectasis vs. pulmonary contusion vs PNA, otherwise negative for acute pathology. Trauma Surgery was consulted and recommended ICU admission. NSGY consulted for IVH and T3 spinous process  fracture.  Admitted to the ICU under the PCCM service.  Patient was transferred to the hospitalist service on 01/06/2022.  Assessment & Plan:   Acute intraventricular hemorrhage following fall at home Nondisplaced T3 spinous process fracture Hx Chari 1 malformation Patient presenting to ED via EMS after being found down outside by spouse.  Imaging notable for intraventricular hemorrhage and nondisplaced T3 spinous process fracture.  Evaluated by neurosurgery on 6/12 with no need for brace for spinous process fracture and repeat head CT the following notable for ventriculomegaly which neurosurgery Dr. Saintclair Halsted believes this is mostly ventriculomegaly ex vacuo; not to hydrocephalus and no neurosurgical intervention needed.  Completed 7 days of prophylactic Keppra per neurosurgery recommendations. --Continue PT/OT efforts while inpatient, pending SNF placement  Acute metabolic encephalopathy Nausea likely multifactorial in the setting of postconcussive syndrome in addition to intraventricular hemorrhage with baseline dementia.  Initially intubated for airway protection and required cortrack.  Now has improved greatly. --Delirium precautions --Get up during the day --Encourage a familiar face to remain present throughout the day --Keep blinds open and lights on during daylight hours --Minimize the use of opioids/benzodiazepines --Supportive care  Acute hypercarbic/hypoxemic respiratory failure On arrival, patient was intubated for airway protection in the setting of level 1 trauma somnolence.  Patient was initially admitted to the intensive care unit.  Imaging concerning for possible pneumonia, with possible aspiration and patient was started on empiric antibiotics.  Patient was successfully extubated on 01/03/2022.  Oxygen now weaned off. --Continue Zosyn, plan 7-day course  Hyponatremia: Resolved Sodium 132 on admission, likely hypovolemic hyponatremia in the setting of dehydration. --Na  132>>>139 --Continue to encourage increased oral intake  Dysphagia Speech therapy following, appreciate assistance. --Dysphagia 3 diet --Aspiration precautions  Acute renal failure:  Resolved Etiology likely secondary to ATN versus prerenal azotemia --Cr 1.17>>1.07 --Avoid not nephrotoxins, renally dose all medications --BMP daily  Hx essential hypertension --Losartan 50 mg p.o. daily  GERD --Protonix 40 mg p.o. daily  Depression/anxiety Hx dementia --Olanzapine 5 mg p.o. nightly; '5mg'$  IM PRN q6h PRN agitation --Venlafaxine 75 mg p.o. twice daily  Insomnia --Melatonin 3 mg p.o. nightly --Reduce home Remeron dose to 15 mg p.o. nightly   DVT prophylaxis: heparin injection 5,000 Units Start: 01/03/22 2200 SCDs Start: 01/01/22 2237    Code Status: DNR Family Communication: No family present at bedside this morning  Disposition Plan:  Level of care: Telemetry Medical Status is: Inpatient Remains inpatient appropriate because: Pending SNF placement    Consultants:  PCCM -signed off 6/17 Palliative care Neurosurgery Trauma surgery  Procedures:  Intubation 6/12 Extubation 6/14  Antimicrobials:  Zosyn 6/15>> Cefazolin 6/12 - 6/12 Ceftriaxone 6/12 - 6/14 Azithromycin 6/12 - 6/12   Subjective: Patient seen examined bedside, resting comfortably.  Waist belt and mitts placed overnight for agitation.  No family present at bedside this morning.  Carly Larson is not confused.  Denies headache, no chest pain, no shortness of breath, no abdominal pain.  RN requesting discontinuing CBG checks, which was accomplished by overnight physician this morning.  No other acute concerns overnight per nursing staff.  Objective: Vitals:   01/10/22 0011 01/10/22 0345 01/10/22 0718 01/10/22 1110  BP: (!) 166/82 (!) 145/101 (!) 155/70 (!) 150/63  Pulse: 97 96 84 84  Resp: '17 16 18 18  '$ Temp: 99 F (37.2 C) 99.4 F (37.4 C) 98 F (36.7 C) 98.7 F (37.1 C)  TempSrc: Oral Oral Oral Oral   SpO2: 96% 96%    Weight:        Intake/Output Summary (Last 24 hours) at 01/10/2022 1358 Last data filed at 01/10/2022 0800 Gross per 24 hour  Intake 60 ml  Output 700 ml  Net -640 ml   Filed Weights   01/06/22 0556 01/07/22 0500 01/08/22 0440  Weight: 79.3 kg 81.9 kg 80.2 kg    Examination:  Physical Exam: GEN: NAD, alert, pleasantly confused, chronically ill in appearance HEENT: Right eyebrow laceration with Steri-Strips noted in place, right sided periorbital ecchymosis, PERRL PULM: CTAB w/o wheezes/crackles, normal respiratory effort, on room air CV: RRR w/o M/G/R GI: abd soft, NTND, NABS, no R/G/M MSK: no peripheral edema, moves all extremities independently; bilateral mitts noted to hands NEURO: CN II-XII intact, no focal deficits, sensation to light touch intact PSYCH: Depressed mood, flat affect Integumentary: dry/intact, no rashes or wounds    Data Reviewed: I have personally reviewed following labs and imaging studies  CBC: Recent Labs  Lab 01/04/22 0310 01/05/22 0413 01/07/22 0252 01/10/22 0305  WBC 18.0* 13.7* 12.7* 15.0*  NEUTROABS 14.2* 10.8*  --   --   HGB 11.9* 12.5 13.1 13.1  HCT 36.0 37.9 39.3 40.9  MCV 94.7 93.3 93.1 94.2  PLT 243 262 314 101   Basic Metabolic Panel: Recent Labs  Lab 01/04/22 0310 01/05/22 0413 01/06/22 0303 01/07/22 0252 01/09/22 1433 01/10/22 0305  NA 134* 139 139 142 141 139  K 3.9 3.7 3.2* 4.0 4.1 4.5  CL 103 108 105 110 106 106  CO2 23 21* 23 21* 22 21*  GLUCOSE 96 87 136* 115* 97 90  BUN 8 6* '9 14 12 10  '$ CREATININE 0.79 0.84 0.82 0.78 0.94 1.07*  CALCIUM 8.1* 8.3* 8.7* 8.5* 9.4 9.3  MG 1.9 2.3 2.2 2.2  --   --  PHOS 3.8 2.3* 2.4*  --   --   --    GFR: CrCl cannot be calculated (Unknown ideal weight.). Liver Function Tests: Recent Labs  Lab 01/04/22 0310  AST 33  ALT 18  ALKPHOS 97  BILITOT 0.4  PROT 5.3*  ALBUMIN 2.5*   No results for input(s): "LIPASE", "AMYLASE" in the last 168 hours. No  results for input(s): "AMMONIA" in the last 168 hours. Coagulation Profile: No results for input(s): "INR", "PROTIME" in the last 168 hours. Cardiac Enzymes: No results for input(s): "CKTOTAL", "CKMB", "CKMBINDEX", "TROPONINI" in the last 168 hours. BNP (last 3 results) No results for input(s): "PROBNP" in the last 8760 hours. HbA1C: No results for input(s): "HGBA1C" in the last 72 hours. CBG: Recent Labs  Lab 01/09/22 1153 01/09/22 1543 01/09/22 2002 01/10/22 0806 01/10/22 1136  GLUCAP 102* 107* 140* 103* 89   Lipid Profile: No results for input(s): "CHOL", "HDL", "LDLCALC", "TRIG", "CHOLHDL", "LDLDIRECT" in the last 72 hours. Thyroid Function Tests: No results for input(s): "TSH", "T4TOTAL", "FREET4", "T3FREE", "THYROIDAB" in the last 72 hours. Anemia Panel: No results for input(s): "VITAMINB12", "FOLATE", "FERRITIN", "TIBC", "IRON", "RETICCTPCT" in the last 72 hours. Sepsis Labs: No results for input(s): "PROCALCITON", "LATICACIDVEN" in the last 168 hours.  Recent Results (from the past 240 hour(s))  Resp Panel by RT-PCR (Flu A&B, Covid) Anterior Nasal Swab     Status: None   Collection Time: 01/01/22  9:33 PM   Specimen: Anterior Nasal Swab  Result Value Ref Range Status   SARS Coronavirus 2 by RT PCR NEGATIVE NEGATIVE Final    Comment: (NOTE) SARS-CoV-2 target nucleic acids are NOT DETECTED.  The SARS-CoV-2 RNA is generally detectable in upper respiratory specimens during the acute phase of infection. The lowest concentration of SARS-CoV-2 viral copies this assay can detect is 138 copies/mL. A negative result does not preclude SARS-Cov-2 infection and should not be used as the sole basis for treatment or other patient management decisions. A negative result may occur with  improper specimen collection/handling, submission of specimen other than nasopharyngeal swab, presence of viral mutation(s) within the areas targeted by this assay, and inadequate number of  viral copies(<138 copies/mL). A negative result must be combined with clinical observations, patient history, and epidemiological information. The expected result is Negative.  Fact Sheet for Patients:  EntrepreneurPulse.com.au  Fact Sheet for Healthcare Providers:  IncredibleEmployment.be  This test is no t yet approved or cleared by the [place] FDA and  has been authorized for detection and/or diagnosis of SARS-CoV-2 by FDA under an Emergency Use Authorization (EUA). This EUA will remain  in effect (meaning this test can be used) for the duration of the COVID-19 declaration under Section 564(b)(1) of the Act, 21 U.S.C.section 360bbb-3(b)(1), unless the authorization is terminated  or revoked sooner.       Influenza A by PCR NEGATIVE NEGATIVE Final   Influenza B by PCR NEGATIVE NEGATIVE Final    Comment: (NOTE) The Xpert Xpress SARS-CoV-2/FLU/RSV plus assay is intended as an aid in the diagnosis of influenza from Nasopharyngeal swab specimens and should not be used as a sole basis for treatment. Nasal washings and aspirates are unacceptable for Xpert Xpress SARS-CoV-2/FLU/RSV testing.  Fact Sheet for Patients: EntrepreneurPulse.com.au  Fact Sheet for Healthcare Providers: IncredibleEmployment.be  This test is not yet approved or cleared by the [place] FDA and has been authorized for detection and/or diagnosis of SARS-CoV-2 by FDA under an Emergency Use Authorization (EUA). This EUA will remain in effect (  meaning this test can be used) for the duration of the COVID-19 declaration under Section 564(b)(1) of the Act, 21 U.S.C. section 360bbb-3(b)(1), unless the authorization is terminated or revoked.  Performed at [facility], [place] [address], [place], [place] [address]   Blood culture (routine x 2)     Status: None   Collection Time: 01/01/22 10:22 PM   Specimen: BLOOD  Result  Value Ref Range Status   Specimen Description BLOOD LEFT ANTECUBITAL  Final   Special Requests   Final    BOTTLES DRAWN AEROBIC AND ANAEROBIC Blood Culture results may not be optimal due to an excessive volume of blood received in culture bottles   Culture   Final    NO GROWTH 5 DAYS Performed at [facility], [place] [address] [place], [place] [address]    Report Status 01/06/2022 FINAL  Final  MRSA Next Gen by PCR, Nasal     Status: None   Collection Time: 01/02/22  1:17 AM   Specimen: Nasal Mucosa; Nasal Swab  Result Value Ref Range Status   MRSA by PCR Next Gen NOT DETECTED NOT DETECTED Final    Comment: (NOTE) The GeneXpert MRSA Assay (FDA approved for NASAL specimens only), is one component of a comprehensive MRSA colonization surveillance program. It is not intended to diagnose MRSA infection nor to guide or monitor treatment for MRSA infections. Test performance is not FDA approved in patients less than 88 years old. Performed at [facility], [place] [address] [place], [place] [address]   Blood culture (routine x 2)     Status: None   Collection Time: 01/02/22  2:55 AM   Specimen: BLOOD  Result Value Ref Range Status   Specimen Description BLOOD RIGHT ANTECUBITAL  Final   Special Requests IN PEDIATRIC BOTTLE Blood Culture adequate volume  Final   Culture   Final    NO GROWTH 5 DAYS Performed at [facility], [place] [address], [place], [place] [address]    Report Status 01/07/2022 FINAL  Final  Culture, Respiratory w Gram Stain     Status: None   Collection Time: 01/02/22  8:48 AM   Specimen: Tracheal Aspirate; Respiratory  Result Value Ref Range Status   Specimen Description TRACHEAL ASPIRATE  Final   Special Requests NONE  Final   Gram Stain   Final    FEW WBC PRESENT, PREDOMINANTLY MONONUCLEAR NO ORGANISMS SEEN    Culture   Final    Normal respiratory flora-no Staph aureus or Pseudomonas seen Performed at [facility], [address],  [place], [place] [address]    Report Status 01/04/2022 FINAL  Final         Radiology Studies: No results found.      Scheduled Meds:  Chlorhexidine Gluconate Cloth  6 each Topical Daily   heparin injection (subcutaneous)  5,000 Units Subcutaneous Q8H   losartan  50 mg Oral Daily   OLANZapine zydis  5 mg Oral QHS   mouth rinse  15 mL Mouth Rinse 4 times per day   pantoprazole sodium  40 mg Oral Daily   polyethylene glycol  17 g Oral Daily   senna-docusate  1 tablet Oral BID   venlafaxine  75 mg Oral BID WC   Continuous Infusions:  sodium chloride 10 mL/hr at 01/05/22 1400   piperacillin-tazobactam (ZOSYN)  IV 3.375 g (01/10/22 0830)     LOS: 9 days    Time spent: 46 minutes  spent on chart review, discussion with nursing staff, consultants, updating family and interview/physical exam; more than 50% of that time was spent in counseling and/or coordination of care.    Accalia Rigdon J [place] DO [facility] Available via Epic secure chat 7am-7pm After these hours, please refer to coverage provider listed on [email or web address] 01/10/2022, 1:58 PM

## 2022-01-10 NOTE — Progress Notes (Signed)
Speech Language Pathology Treatment: Dysphagia  Patient Details Name: Carly Larson MRN: [id number] DOB: [birth year] Today's Date: 01/10/2022 Time: 4158-3094 SLP Time Calculation (min) (ACUTE ONLY): 30 min  Assessment / Plan / Recommendation Clinical Impression  Pt alert and repositioned upright in bed for assessment of diet tolerance during afternoon meal. Pt very distractible, but responding verbally to simple questions and following simple commands. Pt self-fed mechanical soft solids and thin liquids by straw demonstrating no signs of aspiration. Mastication and swallow initiation was swift and full oral clearance of solids was achieved. As mealtime progressed, pt became more distractible. Attempted elimination of environmental distractions (ie. Removal of tray with 1-2 meal items  left on table), increasing lighting within room, placement of meal items in hand and encouraging self-feeding for increased attention. These strategies intermittently assisted pt with improved attention to meal intake, but she ultimately was gesturing and verbalizing that she was done eating. Recommend continue dys 3/thin liquid diet with full supervision during mealtimes given poor attention. Husband reports concern that the pt's communication has not fully returned to baseline as of yet. Will f/u for diet tolerance, training in mealtime strategies and if reassessment of speech-language/cognition is still warranted.   HPI HPI: Pt is a 71 yo female found down after unwitnessed fall. Pt was somnolent upon arrival and intubated in the ED (ETT 6/12-6/14). CT Head showed small IVH, C-spine with nondisplaced T3 spinous process fx, CT Chest with atelectasis vs. PNA. PMH includes: dementia, GERD, HTN, depression/anxiety, insomnia, chiari 1 malformation, lumbar spondylolisthesis s/p lumbar fusion (L3-L4 08/2018). Per chart husband assists with medication administration.      SLP Plan  Continue with current plan of care       Recommendations for follow up therapy are one component of a multi-disciplinary discharge planning process, led by the attending physician.  Recommendations may be updated based on patient status, additional functional criteria and insurance authorization.    Recommendations  Diet recommendations: Dysphagia 3 (mechanical soft);Thin liquid Liquids provided via: Straw;Cup Medication Administration: Whole meds with puree Supervision: Full supervision/cueing for compensatory strategies Compensations: Minimize environmental distractions;Slow rate;Small sips/bites Postural Changes and/or Swallow Maneuvers: Seated upright 90 degrees                Oral Care Recommendations: Oral care BID Follow Up Recommendations: Skilled nursing-short term rehab (<3 hours/day) Assistance recommended at discharge: Frequent or constant Supervision/Assistance SLP Visit Diagnosis: Dysphagia, oropharyngeal phase (R13.12) Plan: Continue with current plan of care            Ellwood Dense, [place], [place] Office Number: [place]  01/10/2022, 12:19 PM

## 2022-01-10 NOTE — Plan of Care (Signed)
  Problem: Clinical Measurements: Goal: Will remain free from infection Outcome: Progressing Goal: Diagnostic test results will improve Outcome: Progressing Goal: Respiratory complications will improve Outcome: Progressing Goal: Cardiovascular complication will be avoided Outcome: Progressing   

## 2022-01-10 NOTE — Progress Notes (Signed)
TRH night cross cover note:   I was contacted by RN with request for clarification on current order for CBG monitoring on a every 4 hour basis.  Per my discussions with the patient's RN, the patient was previously on tube feeds, at which time the every 4 hours CBG monitoring was initiated.  However, she is no longer on tube feeds, and now has a dysphagia 3 diet.  Throughout today, CBG monitoring has yielded blood sugars in the range of 80-1 07, and the patient has not required any associated sliding scale insulin.  She is not a diabetic.  Subsequently, I discontinued order for CBG monitoring.     Babs Bertin, DO Hospitalist

## 2022-01-10 NOTE — Progress Notes (Signed)
Patient sleeping will attempt at next scheduled time. Vitals stable.

## 2022-01-10 NOTE — Plan of Care (Signed)
  Problem: Education: Goal: Knowledge of General Education information will improve Description: Including pain rating scale, medication(s)/side effects and non-pharmacologic comfort measures Outcome: Progressing   Problem: Clinical Measurements: Goal: Ability to maintain clinical measurements within normal limits will improve Outcome: Progressing   Problem: Activity: Goal: Ability to tolerate increased activity will improve Outcome: Completed/Met   Problem: Respiratory: Goal: Ability to maintain a clear airway and adequate ventilation will improve Outcome: Completed/Met   Problem: Role Relationship: Goal: Method of communication will improve Outcome: Completed/Met

## 2022-01-10 NOTE — TOC Progression Note (Signed)
Transition of Care [facility]) - Progression Note    Patient Details  Name: Carly Larson MRN: [id number] Date of Birth: April 29, 1951  Transition of Care [facility]) CM/SW [place], [place] Phone Number: 01/10/2022, 4:37 PM  Clinical Narrative:   CSW checked with patient's daughter, [name], to discuss SNF choice and family are still reviewing but will let CSW know when they have made a decision. Patient still in restraints overnight. CSW to follow.    Expected Discharge Plan: Skilled Nursing Facility Barriers to Discharge: Continued Medical Work up, [facility] will not accept until restraint criteria met  Expected Discharge Plan and Services Expected Discharge Plan: [place] Choice: [place] arrangements for the past 2 months: Single Family Home                                       Social Determinants of Health (SDOH) Interventions    Readmission Risk Interventions     No data to display

## 2022-01-11 DIAGNOSIS — I615 Nontraumatic intracerebral hemorrhage, intraventricular: Secondary | ICD-10-CM | POA: Diagnosis not present

## 2022-01-11 LAB — CBC
HCT: 40.6 % (ref 36.0–46.0)
Hemoglobin: 13.4 g/dL (ref 12.0–15.0)
MCH: 30.8 pg (ref 26.0–34.0)
MCHC: 33 g/dL (ref 30.0–36.0)
MCV: 93.3 fL (ref 80.0–100.0)
Platelets: 415 10*3/uL — ABNORMAL HIGH (ref 150–400)
RBC: 4.35 MIL/uL (ref 3.87–5.11)
RDW: 13.8 % (ref 11.5–15.5)
WBC: 12.8 10*3/uL — ABNORMAL HIGH (ref 4.0–10.5)
nRBC: 0 % (ref 0.0–0.2)

## 2022-01-11 LAB — BASIC METABOLIC PANEL
Anion gap: 12 (ref 5–15)
BUN: 13 mg/dL (ref 8–23)
CO2: 22 mmol/L (ref 22–32)
Calcium: 9.1 mg/dL (ref 8.9–10.3)
Chloride: 106 mmol/L (ref 98–111)
Creatinine, Ser: 0.98 mg/dL (ref 0.44–1.00)
GFR, Estimated: >60 mL/min (ref >60)
Glucose, Bld: 95 mg/dL (ref 70–99)
Potassium: 3.5 mmol/L (ref 3.5–5.1)
Sodium: 140 mmol/L (ref 135–145)

## 2022-01-11 MED ORDER — ENSURE ENLIVE PO LIQD
237.0000 mL | Freq: Three times a day (TID) | ORAL | Status: DC
  Administered 2022-01-12: 237 mL via ORAL

## 2022-01-11 MED ORDER — ORAL CARE MOUTH RINSE: 15.0000 mL | OROMUCOSAL | Status: DC | PRN

## 2022-01-11 MED ORDER — PANTOPRAZOLE SODIUM 40 MG PO TBEC
40.0000 mg | DELAYED_RELEASE_TABLET | Freq: Every day | ORAL | Status: DC
  Administered 2022-01-12: 40 mg via ORAL
  Filled 2022-01-11: qty 1

## 2022-01-11 MED ORDER — GERHARDT'S BUTT CREAM
TOPICAL_CREAM | Freq: Two times a day (BID) | CUTANEOUS | Status: DC
  Filled 2022-01-11: qty 1

## 2022-01-11 NOTE — Progress Notes (Signed)
Nutrition Follow-up  DOCUMENTATION CODES:   Not applicable  INTERVENTION:  Continue current diet as ordered, encourage PO intake Nursing to assist with feeding when family is not present Ensure Enlive po TID, each supplement provides 350 kcal and 20 grams of protein. Monitor GOC and discharge plan  NUTRITION DIAGNOSIS:  Inadequate oral intake related to poor appetite as evidenced by meal completion < 50%. - revised 6/22, diet advanced  GOAL:  Patient will meet greater than or equal to 90% of their needs - progressing, diet in place, add supplements  MONITOR:  PO intake, Supplement acceptance, Labs, Weight trends  REASON FOR ASSESSMENT:  Rounds Enteral/tube feeding initiation and management  ASSESSMENT:  Pt with PMH of HTN, GERD, depression/anxiety, insomnia, dementia, chiari malformation, and lumbar spondylolisthesis s/p lumbar fusion 2020 now admitted after unwitnessed fall with traumatic IVH, and nondisplaced T3 spinous process fx.  6/12 intubated 6/14 extubated 6/15 failed swallow with SLP; will not open mouth, not following commands 6/16 - cortrak tube placed 6/19 - SLP evaluation, upgrade to DYS3 with thin liquids, cortrak removed  Pt resting in bed at the time of assessment. No family present at bedside to assist with interview. Cortrak tube was removed after diet was advanced and recorded intake since that time has been poor. Noted that family was considering residential hospice if ability to take in PO declined. Family made it clear in discussions with palliative care that pt would not want a PEG, which is appropriate given pt's dementia as they have not been proved to increase longevity or quality of life in pt's with dementia.  At this time, will add nutrition supplements to increase the likelihood pt will meet nutrition needs orally.     Average Meal Intake: 6/19-6/22: 25% intake x 4 recorded meals  Nutritionally Relevant Medications: Scheduled Meds:   mirtazapine  15 mg Oral QHS   pantoprazole  40 mg Oral Daily   senna-docusate  1 tablet Oral BID   Continuous Infusions:  piperacillin-tazobactam (ZOSYN)  IV 3.375 g (01/11/22 1003)   Labs Reviewed  NUTRITION - FOCUSED PHYSICAL EXAM: Flowsheet Row Most Recent Value  Orbital Region No depletion  Upper Arm Region No depletion  Thoracic and Lumbar Region No depletion  Buccal Region No depletion  Temple Region No depletion  Clavicle Bone Region No depletion  Clavicle and Acromion Bone Region No depletion  Scapular Bone Region No depletion  Dorsal Hand No depletion  Patellar Region No depletion  Anterior Thigh Region No depletion  Posterior Calf Region No depletion  Edema (RD Assessment) None  Hair Reviewed  Eyes Reviewed  Mouth Reviewed  Skin Reviewed  Nails Reviewed    Diet Order:   Diet Order             DIET DYS 3 Room service appropriate? No; Fluid consistency: Thin  Diet effective now                   EDUCATION NEEDS:  Not appropriate for education at this time  Skin:  Skin Assessment: Skin Integrity Issues: Skin Integrity Issues:: Other (Comment) Other: laceration R upper face  Last BM:  6/21 - type 6  Height:  Ht Readings from Last 1 Encounters:  02/03/19 5' (1.524 m)    Weight:  Wt Readings from Last 1 Encounters:  01/11/22 73.9 kg    Ideal Body Weight:  45.5 kg  BMI:  Body mass index is 31.82 kg/m.  Estimated Nutritional Needs:  Kcal:  1500-1700 Protein:  85-100  grams Fluid:  > 1.5 L/day    Ranell Patrick, RD, LDN Clinical Dietitian RD pager # available in [place]  After hours/weekend pager # available in [facility]

## 2022-01-11 NOTE — TOC Progression Note (Addendum)
Transition of Care [facility]) - Progression Note    Patient Details  Name: AKILA BATTA MRN: [id number] Date of Birth: 28-Dec-1950  Transition of Care [facility]) CM/SW [place], [place] Phone Number: 01/11/2022, 10:52 AM  Clinical Narrative:    CSW spoke with Spouse, Truman Hayward, and he confirmed he would like for pt to go to [facility]. CSW requested CMA's start authorization and updated facility on possible discharge tomorrow. Kim with [facility] will be off tomorrow, CSW to call facility and ask to speak with Esau Grew, who will be doing intake. Facility noted restraints and were advised they would be updated if they need to be put back on, as for now, they have been removed. TOC will continue to follow for DC needs.   Expected Discharge Plan: Skilled Nursing Facility Barriers to Discharge: Continued Medical Work up, [facility] will not accept until restraint criteria met  Expected Discharge Plan and Services Expected Discharge Plan: [place] Choice: [place] arrangements for the past 2 months: Single Family Home                                       Social Determinants of Health (SDOH) Interventions    Readmission Risk Interventions     No data to display

## 2022-01-11 NOTE — Progress Notes (Addendum)
Occupational Therapy Treatment Patient Details Name: Carly Larson MRN: [id number] DOB: 12-Jan-1951 Today's Date: 01/11/2022   History of present illness 71 y.o. female presents to [facility] hospital on 01/01/2022 after fall, with AMS. Pt required intubation due to hypercarbria on 6/12. CT head shows small IVH with T3 spinous process fx. Pt extubated 6/14. PMH includes anxiety, chiari malformation, depression, osteopenia, dementia.   OT comments  Pt progressing towards goals, following ~75% of single step commands this session with increased time. Pt with decreased attention and is easily distracted by environment, requiring multimodal cuing for task initiation. Pt able to complete seated grooming task at EOB with close min guard A and cuing for sequencing. Pt mod-max A for bed mobility, stating "I'm getting tired" at end of session and initiating returning to supine in bed. Performed face to face sit to stand transfer x2 at EOB with mod-max A, pt needing cuing for hand positioning to push from bed/bedrail. HR up to 113 with standing attempts. Pt presenting with impairments listed below, will follow acutely. Continue to recommend SNF at d/c.   Recommendations for follow up therapy are one component of a multi-disciplinary discharge planning process, led by the attending physician.  Recommendations may be updated based on patient status, additional functional criteria and insurance authorization.    Follow Up Recommendations  Skilled nursing-short term rehab (<3 hours/day)    Assistance Recommended at Discharge Frequent or constant Supervision/Assistance  Patient can return home with the following  Two people to help with walking and/or transfers;Two people to help with bathing/dressing/bathroom;Assistance with cooking/housework;Assistance with feeding;Direct supervision/assist for medications management;Direct supervision/assist for financial management;Assist for transportation;Help with stairs or ramp for  entrance   Equipment Recommendations  None recommended by OT;Other (comment) (defer to next venue of care)    Recommendations for Other Services      Precautions / Restrictions Precautions Precautions: Fall Precaution Comments: monitor SpO2 and HR Restrictions Weight Bearing Restrictions: No       Mobility Bed Mobility Overal bed mobility: Needs Assistance Bed Mobility: Sit to Supine, Supine to Sit     Supine to sit: Max assist Sit to supine: Mod assist   General bed mobility comments: assist for BLE's returning to bed    Transfers Overall transfer level: Needs assistance Equipment used: 1 person hand held assist Transfers: Sit to/from Stand Sit to Stand: Max assist, Mod assist           General transfer comment: mod with pt pushing from bed, max when pt unable to follow cues, completed sit to stand transfer x2, HR 113, face to face transfer     Balance Overall balance assessment: Needs assistance Sitting-balance support: No upper extremity supported, Feet supported Sitting balance-Leahy Scale: Fair Sitting balance - Comments: close min guard, unable to remain upright when balance challenged   Standing balance support: Bilateral upper extremity supported, Single extremity supported Standing balance-Leahy Scale: Poor Standing balance comment: reliant on external support                           ADL either performed or assessed with clinical judgement   ADL Overall ADL's : Needs assistance/impaired     Grooming: Wash/dry hands;Cueing for sequencing;Min guard Grooming Details (indicate cue type and reason): holds hand out for lotion, washes hands seated EOB  Extremity/Trunk Assessment Upper Extremity Assessment Upper Extremity Assessment: Overall WFL for tasks assessed   Lower Extremity Assessment Lower Extremity Assessment: Defer to PT evaluation        Vision   Additional Comments: pt  tracking L/R side during session, mostly keeps eyes at midline, will further assess   Perception Perception Perception: Not tested   Praxis Praxis Praxis: Impaired Praxis Impairment Details: Ideation;Initiation;Ideomotor Praxis-Other Comments: when given comb, combs eyebrows, uses lotion and washcloth appropriately this session    Cognition Arousal/Alertness: Awake/alert Behavior During Therapy: Restless, Impulsive Overall Cognitive Status: History of cognitive impairments - at baseline Area of Impairment: Awareness, Problem solving, Safety/judgement, Following commands                 Orientation Level: Disoriented to, Place, Time, Situation Current Attention Level: Sustained Memory: Decreased recall of precautions, Decreased short-term memory Following Commands: Follows one step commands inconsistently Safety/Judgement: Decreased awareness of safety, Decreased awareness of deficits Awareness: Intellectual Problem Solving: Slow processing, Difficulty sequencing, Requires verbal cues, Requires tactile cues General Comments: difficult to assess due to communication deficits; pt occasionally with appropriate responses, stating " I'm getting tired" then inititating lying down in bed        Exercises      Shoulder Instructions       General Comments VSS on RA    Pertinent Vitals/ Pain       Pain Assessment Pain Assessment: Faces Pain Intervention(s): Repositioned, Monitored during session  Home Living                                          Prior Functioning/Environment              Frequency  Min 2X/week        Progress Toward Goals  OT Goals(current goals can now be found in the care plan section)  Progress towards OT goals: Progressing toward goals  Acute Rehab OT Goals Patient Stated Goal: none stated OT Goal Formulation: With patient/family Time For Goal Achievement: 01/18/22 Potential to Achieve Goals: Fair ADL Goals Pt  Will Perform Eating: with set-up;sitting Pt Will Perform Grooming: standing;with min guard assist Pt Will Transfer to Toilet: with min guard assist;ambulating;regular height toilet Pt Will Perform Toileting - Clothing Manipulation and hygiene: with min guard assist;sit to/from stand Pt Will Perform Tub/Shower Transfer: with min guard assist;Shower transfer;ambulating  Plan Frequency remains appropriate;Discharge plan needs to be updated    Co-evaluation                 AM-PAC OT "6 Clicks" Daily Activity     Outcome Measure   Help from another person eating meals?: A Lot Help from another person taking care of personal grooming?: A Lot Help from another person toileting, which includes using toliet, bedpan, or urinal?: Total Help from another person bathing (including washing, rinsing, drying)?: A Lot Help from another person to put on and taking off regular upper body clothing?: A Lot Help from another person to put on and taking off regular lower body clothing?: Total 6 Click Score: 10    End of Session Equipment Utilized During Treatment: Gait belt  OT Visit Diagnosis: Other symptoms and signs involving the nervous system (R29.898)   Activity Tolerance Patient tolerated treatment well   Patient Left in bed;with call bell/phone within reach;with bed alarm set (MD in room during session, pt no longer  needs mit restraints)   Nurse Communication Mobility status        Time: [phone] OT Time Calculation (min): 28 min  Charges: OT General Charges $OT Visit: 1 Visit OT Treatments $Self Care/Home Management : 23-37 mins  Lynnda Child, OTD, OTR/L Acute Rehab [phone]) [phone] [place] 01/11/2022, 9:49 AM

## 2022-01-11 NOTE — Progress Notes (Signed)
PROGRESS NOTE    Carly Larson  BSJ:[id number] DOB: 07-12-1951 DOA: 01/01/2022 PCP: Cyndi Bender, PA-C    Brief Narrative:   Carly Larson is a 71 y.o. female with past medical history significant for HTN, GERD, depression/anxiety, dementia, Chari 1 malformation, lumbar spondylolisthesis s/p fusion (L3-4 08/2018), insomnia who presented to [facility] ED on 6/12 via EMS following an unwitnessed fall at home.  Patient's husband reports that she was found outside in the carport with a laceration on the right side above her eye.  History obtained from chart, as husband/family not available and patient is intubated/sedated. Per report, patient was more somnolent than usual prior to admission; she has dementia at baseline but is interactive/functional with assistance and confusion at times waxes/wanes. Husband assists her with ADLs including medication administration; she did not receive any additional doses. Reports that he found patient down in the [place] after a fall. Patient sustained a R lateral forehead laceration (repaired by EDP).    On ED arrival, patient was somnolent but was per report answering simple questions with EMS en route. Intubated for airway protection in the setting of somnolence/level 1 trauma. Vitals on arrival to ED demonstrated temp 97.6, tachycardia to 130s, mild hypertension, RR 25 and O2 sat 100%. Labs demonstrated mild leukocytosis with WBC 14.9, mild hyponatremia to 132, mild AKI with Cr 1.1 (baseline 0.8), normal INR 1.0. LA/PCT and BCx/UCx sent. Head/Neck imaging (CT Head, CT Maxillofacial, CT C-spine) demonstrated small acute IVH and nondisplaced fracture of T3 spinous process, otherwise negative. CT Chest/A/P demonstrated bilateral lower lobe/posteropr subpleural patchy subsegmental densities, ?atelectasis vs. pulmonary contusion vs PNA, otherwise negative for acute pathology. Trauma Surgery was consulted and recommended ICU admission. NSGY consulted for IVH and T3 spinous process  fracture.  Admitted to the ICU under the PCCM service.  Patient was transferred to the hospitalist service on 01/06/2022.  Assessment & Plan:   Acute intraventricular hemorrhage following fall at home Nondisplaced T3 spinous process fracture Hx Chari 1 malformation Patient presenting to ED via EMS after being found down outside by spouse.  Imaging notable for intraventricular hemorrhage and nondisplaced T3 spinous process fracture.  Evaluated by neurosurgery on 6/12 with no need for brace for spinous process fracture and repeat head CT the following notable for ventriculomegaly which neurosurgery Dr. Saintclair Halsted believes this is mostly ventriculomegaly ex vacuo; not to hydrocephalus and no neurosurgical intervention needed.  Completed 7 days of prophylactic Keppra per neurosurgery recommendations. --Continue PT/OT efforts while inpatient, pending SNF placement  Acute metabolic encephalopathy Nausea likely multifactorial in the setting of postconcussive syndrome in addition to intraventricular hemorrhage with baseline dementia.  Initially intubated for airway protection and required cortrack.  Now has improved greatly. --Delirium precautions --Get up during the day --Encourage a familiar face to remain present throughout the day --Keep blinds open and lights on during daylight hours --Minimize the use of opioids/benzodiazepines --Supportive care  Acute hypercarbic/hypoxemic respiratory failure On arrival, patient was intubated for airway protection in the setting of level 1 trauma somnolence.  Patient was initially admitted to the intensive care unit.  Imaging concerning for possible pneumonia, with possible aspiration and patient was started on empiric antibiotics.  Patient was successfully extubated on 01/03/2022.  Oxygen now weaned off.  Complete 7-day course of Zosyn today.  Hyponatremia: Resolved Sodium 132 on admission, likely hypovolemic hyponatremia in the setting of dehydration. --Na  132>>>139>140 --Continue to encourage increased oral intake  Dysphagia Speech therapy following, appreciate assistance. --Dysphagia 3 diet --Aspiration precautions  Acute  renal failure: Resolved Etiology likely secondary to ATN versus prerenal azotemia --Cr 1.17>>1.07>0.98 --Avoid not nephrotoxins, renally dose all medications --BMP daily  Hx essential hypertension --Losartan 50 mg p.o. daily  GERD --Protonix 40 mg p.o. daily  Depression/anxiety Hx dementia --Olanzapine 5 mg p.o. nightly; '5mg'$  IM PRN q6h PRN agitation --Venlafaxine 75 mg p.o. twice daily  Insomnia --Melatonin 3 mg p.o. nightly --Reduce home Remeron dose to 15 mg p.o. nightly   DVT prophylaxis: heparin injection 5,000 Units Start: 01/03/22 2200 SCDs Start: 01/01/22 2237    Code Status: DNR Family Communication: No family present at bedside this morning  Disposition Plan:  Level of care: Telemetry Medical Status is: Inpatient Remains inpatient appropriate because: Pending SNF placement    Consultants:  PCCM -signed off 6/17 Palliative care Neurosurgery Trauma surgery  Procedures:  Intubation 6/12 Extubation 6/14  Antimicrobials:  Zosyn 6/15>> Cefazolin 6/12 - 6/12 Ceftriaxone 6/12 - 6/14 Azithromycin 6/12 - 6/12   Subjective: Patient seen examined bedside, resting comfortably.  Working with OT this morning, restraints now off.  Patient remains pleasantly confused.  No family present.  Pending SNF placement.  No acute concerns overnight per nursing staff.   Objective: Vitals:   01/11/22 0406 01/11/22 0416 01/11/22 0824 01/11/22 1107  BP: (!) 134/48  (!) 150/69 (!) 137/105  Pulse: 95  98 (!) 104  Resp: '17  16 16  '$ Temp: 99.5 F (37.5 C)  98.9 F (37.2 C) 98.6 F (37 C)  TempSrc: Axillary  Oral Oral  SpO2: 96%  100% 98%  Weight:  73.9 kg      Intake/Output Summary (Last 24 hours) at 01/11/2022 1249 Last data filed at 01/11/2022 1106 Gross per 24 hour  Intake 1529.64 ml  Output  900 ml  Net 629.64 ml   Filed Weights   01/07/22 0500 01/08/22 0440 01/11/22 0416  Weight: 81.9 kg 80.2 kg 73.9 kg    Examination:  Physical Exam: GEN: NAD, alert, pleasantly confused, chronically ill in appearance HEENT: Right eyebrow laceration with Steri-Strips noted in place, right sided periorbital ecchymosis, PERRL PULM: CTAB w/o wheezes/crackles, normal respiratory effort, on room air CV: RRR w/o M/G/R GI: abd soft, NTND, NABS, no R/G/M MSK: no peripheral edema, moves all extremities independently NEURO: CN II-XII intact, no focal deficits, sensation to light touch intact PSYCH: Depressed mood, flat affect Integumentary: dry/intact, no rashes or wounds    Data Reviewed: I have personally reviewed following labs and imaging studies  CBC: Recent Labs  Lab 01/05/22 0413 01/07/22 0252 01/10/22 0305 01/11/22 0206  WBC 13.7* 12.7* 15.0* 12.8*  NEUTROABS 10.8*  --   --   --   HGB 12.5 13.1 13.1 13.4  HCT 37.9 39.3 40.9 40.6  MCV 93.3 93.1 94.2 93.3  PLT 262 314 390 426*   Basic Metabolic Panel: Recent Labs  Lab 01/05/22 0413 01/06/22 0303 01/07/22 0252 01/09/22 1433 01/10/22 0305 01/11/22 0206  NA 139 139 142 141 139 140  K 3.7 3.2* 4.0 4.1 4.5 3.5  CL 108 105 110 106 106 106  CO2 21* 23 21* 22 21* 22  GLUCOSE 87 136* 115* 97 90 95  BUN 6* '9 14 12 10 13  '$ CREATININE 0.84 0.82 0.78 0.94 1.07* 0.98  CALCIUM 8.3* 8.7* 8.5* 9.4 9.3 9.1  MG 2.3 2.2 2.2  --   --   --   PHOS 2.3* 2.4*  --   --   --   --    GFR: CrCl cannot be calculated (Unknown  ideal weight.). Liver Function Tests: No results for input(s): "AST", "ALT", "ALKPHOS", "BILITOT", "PROT", "ALBUMIN" in the last 168 hours.  No results for input(s): "LIPASE", "AMYLASE" in the last 168 hours. No results for input(s): "AMMONIA" in the last 168 hours. Coagulation Profile: No results for input(s): "INR", "PROTIME" in the last 168 hours. Cardiac Enzymes: No results for input(s): "CKTOTAL", "CKMB",  "CKMBINDEX", "TROPONINI" in the last 168 hours. BNP (last 3 results) No results for input(s): "PROBNP" in the last 8760 hours. HbA1C: No results for input(s): "HGBA1C" in the last 72 hours. CBG: Recent Labs  Lab 01/09/22 1153 01/09/22 1543 01/09/22 2002 01/10/22 0806 01/10/22 1136  GLUCAP 102* 107* 140* 103* 89   Lipid Profile: No results for input(s): "CHOL", "HDL", "LDLCALC", "TRIG", "CHOLHDL", "LDLDIRECT" in the last 72 hours. Thyroid Function Tests: No results for input(s): "TSH", "T4TOTAL", "FREET4", "T3FREE", "THYROIDAB" in the last 72 hours. Anemia Panel: No results for input(s): "VITAMINB12", "FOLATE", "FERRITIN", "TIBC", "IRON", "RETICCTPCT" in the last 72 hours. Sepsis Labs: No results for input(s): "PROCALCITON", "LATICACIDVEN" in the last 168 hours.  Recent Results (from the past 240 hour(s))  Resp Panel by RT-PCR (Flu A&B, Covid) Anterior Nasal Swab     Status: None   Collection Time: 01/01/22  9:33 PM   Specimen: Anterior Nasal Swab  Result Value Ref Range Status   SARS Coronavirus 2 by RT PCR NEGATIVE NEGATIVE Final    Comment: (NOTE) SARS-CoV-2 target nucleic acids are NOT DETECTED.  The SARS-CoV-2 RNA is generally detectable in upper respiratory specimens during the acute phase of infection. The lowest concentration of SARS-CoV-2 viral copies this assay can detect is 138 copies/mL. A negative result does not preclude SARS-Cov-2 infection and should not be used as the sole basis for treatment or other patient management decisions. A negative result may occur with  improper specimen collection/handling, submission of specimen other than nasopharyngeal swab, presence of viral mutation(s) within the areas targeted by this assay, and inadequate number of viral copies(<138 copies/mL). A negative result must be combined with clinical observations, patient history, and epidemiological information. The expected result is Negative.  Fact Sheet for Patients:   EntrepreneurPulse.com.au  Fact Sheet for Healthcare Providers:  IncredibleEmployment.be  This test is no t yet approved or cleared by the [place] FDA and  has been authorized for detection and/or diagnosis of SARS-CoV-2 by FDA under an Emergency Use Authorization (EUA). This EUA will remain  in effect (meaning this test can be used) for the duration of the COVID-19 declaration under Section 564(b)(1) of the Act, 21 U.S.C.section 360bbb-3(b)(1), unless the authorization is terminated  or revoked sooner.       Influenza A by PCR NEGATIVE NEGATIVE Final   Influenza B by PCR NEGATIVE NEGATIVE Final    Comment: (NOTE) The Xpert Xpress SARS-CoV-2/FLU/RSV plus assay is intended as an aid in the diagnosis of influenza from Nasopharyngeal swab specimens and should not be used as a sole basis for treatment. Nasal washings and aspirates are unacceptable for Xpert Xpress SARS-CoV-2/FLU/RSV testing.  Fact Sheet for Patients: EntrepreneurPulse.com.au  Fact Sheet for Healthcare Providers: IncredibleEmployment.be  This test is not yet approved or cleared by the [place] FDA and has been authorized for detection and/or diagnosis of SARS-CoV-2 by FDA under an Emergency Use Authorization (EUA). This EUA will remain in effect (meaning this test can be used) for the duration of the COVID-19 declaration under Section 564(b)(1) of the Act, 21 U.S.C. section 360bbb-3(b)(1), unless the authorization is terminated or revoked.  Performed at [facility], [place] [address], [place], [place] [address]   Blood culture (routine x 2)     Status: None   Collection Time: 01/01/22 10:22 PM   Specimen: BLOOD  Result Value Ref Range Status   Specimen Description BLOOD LEFT ANTECUBITAL  Final   Special Requests   Final    BOTTLES DRAWN AEROBIC AND ANAEROBIC Blood Culture results may not be optimal due to an excessive  volume of blood received in culture bottles   Culture   Final    NO GROWTH 5 DAYS Performed at [facility], [place] [address], [place], [place] [address]    Report Status 01/06/2022 FINAL  Final  MRSA Next Gen by PCR, Nasal     Status: None   Collection Time: 01/02/22  1:17 AM   Specimen: Nasal Mucosa; Nasal Swab  Result Value Ref Range Status   MRSA by PCR Next Gen NOT DETECTED NOT DETECTED Final    Comment: (NOTE) The GeneXpert MRSA Assay (FDA approved for NASAL specimens only), is one component of a comprehensive MRSA colonization surveillance program. It is not intended to diagnose MRSA infection nor to guide or monitor treatment for MRSA infections. Test performance is not FDA approved in patients less than 28 years old. Performed at [facility], [place] [address], [place], [place] [address]   Blood culture (routine x 2)     Status: None   Collection Time: 01/02/22  2:55 AM   Specimen: BLOOD  Result Value Ref Range Status   Specimen Description BLOOD RIGHT ANTECUBITAL  Final   Special Requests IN PEDIATRIC BOTTLE Blood Culture adequate volume  Final   Culture   Final    NO GROWTH 5 DAYS Performed at [facility], [place] [address], [place], [place] [address]    Report Status 01/07/2022 FINAL  Final  Culture, Respiratory w Gram Stain     Status: None   Collection Time: 01/02/22  8:48 AM   Specimen: Tracheal Aspirate; Respiratory  Result Value Ref Range Status   Specimen Description TRACHEAL ASPIRATE  Final   Special Requests NONE  Final   Gram Stain   Final    FEW WBC PRESENT, PREDOMINANTLY MONONUCLEAR NO ORGANISMS SEEN    Culture   Final    Normal respiratory flora-no Staph aureus or Pseudomonas seen Performed at [facility], [address], [place], [place] [address]    Report Status 01/04/2022 FINAL  Final         Radiology Studies: No results found.      Scheduled Meds:  Gerhardt's butt cream   Topical BID   heparin injection  (subcutaneous)  5,000 Units Subcutaneous Q8H   losartan  50 mg Oral Daily   melatonin  3 mg Oral QHS   mirtazapine  15 mg Oral QHS   OLANZapine zydis  5 mg Oral QHS   pantoprazole  40 mg Oral Daily   senna-docusate  1 tablet Oral BID   venlafaxine  75 mg Oral BID WC   Continuous Infusions:  sodium chloride Stopped (01/10/22 1901)   piperacillin-tazobactam (ZOSYN)  IV 3.375 g (01/11/22 1003)     LOS: 10 days    Time spent: 46 minutes spent on chart review, discussion with nursing staff, consultants, updating family and interview/physical exam; more than 50% of that time was spent in counseling and/or coordination of care.    Lyrik Buresh J [place] DO [facility] Available via Epic secure  chat 7am-7pm After these hours, please refer to coverage provider listed on [email or web address] 01/11/2022, 12:49 PM

## 2022-01-12 DIAGNOSIS — F5101 Primary insomnia: Secondary | ICD-10-CM | POA: Diagnosis not present

## 2022-01-12 DIAGNOSIS — F331 Major depressive disorder, recurrent, moderate: Secondary | ICD-10-CM | POA: Diagnosis not present

## 2022-01-12 DIAGNOSIS — W19XXXD Unspecified fall, subsequent encounter: Secondary | ICD-10-CM | POA: Diagnosis not present

## 2022-01-12 DIAGNOSIS — J9602 Acute respiratory failure with hypercapnia: Secondary | ICD-10-CM | POA: Diagnosis not present

## 2022-01-12 DIAGNOSIS — R269 Unspecified abnormalities of gait and mobility: Secondary | ICD-10-CM | POA: Diagnosis not present

## 2022-01-12 DIAGNOSIS — W1839XA Other fall on same level, initial encounter: Secondary | ICD-10-CM | POA: Diagnosis not present

## 2022-01-12 DIAGNOSIS — Z66 Do not resuscitate: Secondary | ICD-10-CM | POA: Diagnosis not present

## 2022-01-12 DIAGNOSIS — R69 Illness, unspecified: Secondary | ICD-10-CM | POA: Diagnosis not present

## 2022-01-12 DIAGNOSIS — Z7189 Other specified counseling: Secondary | ICD-10-CM | POA: Diagnosis not present

## 2022-01-12 DIAGNOSIS — G4700 Insomnia, unspecified: Secondary | ICD-10-CM | POA: Diagnosis not present

## 2022-01-12 DIAGNOSIS — F330 Major depressive disorder, recurrent, mild: Secondary | ICD-10-CM | POA: Diagnosis not present

## 2022-01-12 DIAGNOSIS — T07XXXA Unspecified multiple injuries, initial encounter: Secondary | ICD-10-CM | POA: Diagnosis not present

## 2022-01-12 DIAGNOSIS — S0291XA Unspecified fracture of skull, initial encounter for closed fracture: Secondary | ICD-10-CM | POA: Diagnosis not present

## 2022-01-12 DIAGNOSIS — S22039D Unspecified fracture of third thoracic vertebra, subsequent encounter for fracture with routine healing: Secondary | ICD-10-CM | POA: Diagnosis not present

## 2022-01-12 DIAGNOSIS — I10 Essential (primary) hypertension: Secondary | ICD-10-CM | POA: Diagnosis not present

## 2022-01-12 DIAGNOSIS — F0390 Unspecified dementia without behavioral disturbance: Secondary | ICD-10-CM | POA: Diagnosis not present

## 2022-01-12 DIAGNOSIS — F03C18 Unspecified dementia, severe, with other behavioral disturbance: Secondary | ICD-10-CM | POA: Diagnosis not present

## 2022-01-12 DIAGNOSIS — K219 Gastro-esophageal reflux disease without esophagitis: Secondary | ICD-10-CM | POA: Diagnosis not present

## 2022-01-12 DIAGNOSIS — Z515 Encounter for palliative care: Secondary | ICD-10-CM | POA: Diagnosis not present

## 2022-01-12 DIAGNOSIS — Y92008 Other place in unspecified non-institutional (private) residence as the place of occurrence of the external cause: Secondary | ICD-10-CM | POA: Diagnosis not present

## 2022-01-12 DIAGNOSIS — Z7401 Bed confinement status: Secondary | ICD-10-CM | POA: Diagnosis not present

## 2022-01-12 DIAGNOSIS — F03911 Unspecified dementia, unspecified severity, with agitation: Secondary | ICD-10-CM | POA: Diagnosis not present

## 2022-01-12 DIAGNOSIS — W19XXXS Unspecified fall, sequela: Secondary | ICD-10-CM | POA: Diagnosis not present

## 2022-01-12 DIAGNOSIS — R1310 Dysphagia, unspecified: Secondary | ICD-10-CM | POA: Diagnosis not present

## 2022-01-12 DIAGNOSIS — R531 Weakness: Secondary | ICD-10-CM | POA: Diagnosis not present

## 2022-01-12 DIAGNOSIS — R2681 Unsteadiness on feet: Secondary | ICD-10-CM | POA: Diagnosis not present

## 2022-01-12 DIAGNOSIS — Q0700 Arnold-Chiari syndrome without spina bifida or hydrocephalus: Secondary | ICD-10-CM | POA: Diagnosis not present

## 2022-01-12 DIAGNOSIS — M4316 Spondylolisthesis, lumbar region: Secondary | ICD-10-CM | POA: Diagnosis not present

## 2022-01-12 DIAGNOSIS — Z23 Encounter for immunization: Secondary | ICD-10-CM | POA: Diagnosis present

## 2022-01-12 DIAGNOSIS — I615 Nontraumatic intracerebral hemorrhage, intraventricular: Secondary | ICD-10-CM | POA: Diagnosis not present

## 2022-01-12 DIAGNOSIS — F419 Anxiety disorder, unspecified: Secondary | ICD-10-CM | POA: Diagnosis not present

## 2022-01-12 MED ORDER — OLANZAPINE 5 MG PO TBDP: 5.0000 mg | ORAL_TABLET | Freq: Every day | ORAL | 0 refills | Status: AC

## 2022-01-12 MED ORDER — PANTOPRAZOLE SODIUM 40 MG PO TBEC: 40.0000 mg | DELAYED_RELEASE_TABLET | Freq: Every day | ORAL | Status: AC

## 2022-01-12 MED ORDER — MELATONIN 3 MG PO TABS: 3.0000 mg | ORAL_TABLET | Freq: Every day | ORAL | 0 refills | Status: AC

## 2022-01-12 MED ORDER — VENLAFAXINE HCL 75 MG PO TABS: 75.0000 mg | ORAL_TABLET | Freq: Two times a day (BID) | ORAL | Status: AC

## 2022-01-12 MED ORDER — MIRTAZAPINE 15 MG PO TABS: 15.0000 mg | ORAL_TABLET | Freq: Every day | ORAL | 0 refills | Status: AC

## 2022-01-12 MED ORDER — SENNOSIDES-DOCUSATE SODIUM 8.6-50 MG PO TABS: 1.0000 | ORAL_TABLET | Freq: Two times a day (BID) | ORAL | Status: AC

## 2022-01-12 NOTE — Plan of Care (Signed)

## 2022-01-15 DIAGNOSIS — S22039D Unspecified fracture of third thoracic vertebra, subsequent encounter for fracture with routine healing: Secondary | ICD-10-CM | POA: Diagnosis not present

## 2022-01-15 DIAGNOSIS — W19XXXD Unspecified fall, subsequent encounter: Secondary | ICD-10-CM | POA: Diagnosis not present

## 2022-01-15 DIAGNOSIS — I615 Nontraumatic intracerebral hemorrhage, intraventricular: Secondary | ICD-10-CM | POA: Diagnosis not present

## 2022-01-15 DIAGNOSIS — R69 Illness, unspecified: Secondary | ICD-10-CM | POA: Diagnosis not present

## 2022-01-18 DIAGNOSIS — J9602 Acute respiratory failure with hypercapnia: Secondary | ICD-10-CM | POA: Diagnosis not present

## 2022-01-18 DIAGNOSIS — S22039D Unspecified fracture of third thoracic vertebra, subsequent encounter for fracture with routine healing: Secondary | ICD-10-CM | POA: Diagnosis not present

## 2022-01-18 DIAGNOSIS — R69 Illness, unspecified: Secondary | ICD-10-CM | POA: Diagnosis not present

## 2022-01-18 DIAGNOSIS — R1310 Dysphagia, unspecified: Secondary | ICD-10-CM | POA: Diagnosis not present

## 2022-01-18 DIAGNOSIS — I615 Nontraumatic intracerebral hemorrhage, intraventricular: Secondary | ICD-10-CM | POA: Diagnosis not present

## 2022-01-18 DIAGNOSIS — G4700 Insomnia, unspecified: Secondary | ICD-10-CM | POA: Diagnosis not present

## 2022-01-22 DIAGNOSIS — G4700 Insomnia, unspecified: Secondary | ICD-10-CM | POA: Diagnosis not present

## 2022-01-22 DIAGNOSIS — F330 Major depressive disorder, recurrent, mild: Secondary | ICD-10-CM | POA: Diagnosis not present

## 2022-01-22 DIAGNOSIS — F0390 Unspecified dementia without behavioral disturbance: Secondary | ICD-10-CM | POA: Diagnosis not present

## 2022-01-22 DIAGNOSIS — R69 Illness, unspecified: Secondary | ICD-10-CM | POA: Diagnosis not present

## 2022-01-23 DIAGNOSIS — R269 Unspecified abnormalities of gait and mobility: Secondary | ICD-10-CM | POA: Diagnosis not present

## 2022-01-23 DIAGNOSIS — I615 Nontraumatic intracerebral hemorrhage, intraventricular: Secondary | ICD-10-CM | POA: Diagnosis not present

## 2022-01-23 DIAGNOSIS — R2681 Unsteadiness on feet: Secondary | ICD-10-CM | POA: Diagnosis not present

## 2022-01-23 DIAGNOSIS — R69 Illness, unspecified: Secondary | ICD-10-CM | POA: Diagnosis not present

## 2022-01-23 DIAGNOSIS — W19XXXS Unspecified fall, sequela: Secondary | ICD-10-CM | POA: Diagnosis not present

## 2022-01-29 DIAGNOSIS — S22039D Unspecified fracture of third thoracic vertebra, subsequent encounter for fracture with routine healing: Secondary | ICD-10-CM | POA: Diagnosis not present

## 2022-01-29 DIAGNOSIS — R69 Illness, unspecified: Secondary | ICD-10-CM | POA: Diagnosis not present

## 2022-01-30 DIAGNOSIS — R5381 Other malaise: Secondary | ICD-10-CM | POA: Diagnosis not present

## 2022-01-30 DIAGNOSIS — F32A Depression, unspecified: Secondary | ICD-10-CM | POA: Diagnosis not present

## 2022-01-30 DIAGNOSIS — F03C0 Unspecified dementia, severe, without behavioral disturbance, psychotic disturbance, mood disturbance, and anxiety: Secondary | ICD-10-CM | POA: Diagnosis not present

## 2022-01-30 DIAGNOSIS — G935 Compression of brain: Secondary | ICD-10-CM | POA: Diagnosis not present

## 2022-01-30 DIAGNOSIS — R69 Illness, unspecified: Secondary | ICD-10-CM | POA: Diagnosis not present

## 2022-02-06 DIAGNOSIS — R5381 Other malaise: Secondary | ICD-10-CM | POA: Diagnosis not present

## 2022-02-06 DIAGNOSIS — G935 Compression of brain: Secondary | ICD-10-CM | POA: Diagnosis not present

## 2022-02-06 DIAGNOSIS — R69 Illness, unspecified: Secondary | ICD-10-CM | POA: Diagnosis not present

## 2022-02-07 DIAGNOSIS — R5381 Other malaise: Secondary | ICD-10-CM | POA: Diagnosis not present

## 2022-02-07 DIAGNOSIS — I872 Venous insufficiency (chronic) (peripheral): Secondary | ICD-10-CM | POA: Diagnosis not present

## 2022-02-07 DIAGNOSIS — F03C0 Unspecified dementia, severe, without behavioral disturbance, psychotic disturbance, mood disturbance, and anxiety: Secondary | ICD-10-CM | POA: Diagnosis not present

## 2022-02-07 DIAGNOSIS — R69 Illness, unspecified: Secondary | ICD-10-CM | POA: Diagnosis not present

## 2022-03-06 DIAGNOSIS — W19XXXA Unspecified fall, initial encounter: Secondary | ICD-10-CM | POA: Diagnosis not present

## 2022-03-06 DIAGNOSIS — S0181XA Laceration without foreign body of other part of head, initial encounter: Secondary | ICD-10-CM | POA: Diagnosis not present

## 2022-03-06 DIAGNOSIS — R402431 Glasgow coma scale score 3-8, in the field [EMT or ambulance]: Secondary | ICD-10-CM | POA: Diagnosis not present

## 2022-03-06 DIAGNOSIS — Z043 Encounter for examination and observation following other accident: Secondary | ICD-10-CM | POA: Diagnosis not present

## 2022-03-06 DIAGNOSIS — R69 Illness, unspecified: Secondary | ICD-10-CM | POA: Diagnosis not present

## 2022-03-06 DIAGNOSIS — S0990XA Unspecified injury of head, initial encounter: Secondary | ICD-10-CM | POA: Diagnosis not present

## 2022-03-07 DIAGNOSIS — Z043 Encounter for examination and observation following other accident: Secondary | ICD-10-CM | POA: Diagnosis not present

## 2022-03-13 DIAGNOSIS — F03C0 Unspecified dementia, severe, without behavioral disturbance, psychotic disturbance, mood disturbance, and anxiety: Secondary | ICD-10-CM | POA: Diagnosis not present

## 2022-03-13 DIAGNOSIS — I10 Essential (primary) hypertension: Secondary | ICD-10-CM | POA: Diagnosis not present

## 2022-03-13 DIAGNOSIS — F5104 Psychophysiologic insomnia: Secondary | ICD-10-CM | POA: Diagnosis not present

## 2022-03-13 DIAGNOSIS — F32A Depression, unspecified: Secondary | ICD-10-CM | POA: Diagnosis not present

## 2022-03-13 DIAGNOSIS — R69 Illness, unspecified: Secondary | ICD-10-CM | POA: Diagnosis not present

## 2022-03-26 DIAGNOSIS — Z5181 Encounter for therapeutic drug level monitoring: Secondary | ICD-10-CM | POA: Diagnosis not present

## 2022-03-26 DIAGNOSIS — E119 Type 2 diabetes mellitus without complications: Secondary | ICD-10-CM | POA: Diagnosis not present

## 2022-03-26 DIAGNOSIS — K769 Liver disease, unspecified: Secondary | ICD-10-CM | POA: Diagnosis not present

## 2022-03-26 DIAGNOSIS — D649 Anemia, unspecified: Secondary | ICD-10-CM | POA: Diagnosis not present

## 2022-04-03 DIAGNOSIS — G301 Alzheimer's disease with late onset: Secondary | ICD-10-CM | POA: Diagnosis not present

## 2022-04-03 DIAGNOSIS — F419 Anxiety disorder, unspecified: Secondary | ICD-10-CM | POA: Diagnosis not present

## 2022-04-03 DIAGNOSIS — F331 Major depressive disorder, recurrent, moderate: Secondary | ICD-10-CM | POA: Diagnosis not present

## 2022-04-03 DIAGNOSIS — F5101 Primary insomnia: Secondary | ICD-10-CM | POA: Diagnosis not present

## 2022-04-03 DIAGNOSIS — F02C3 Dementia in other diseases classified elsewhere, severe, with mood disturbance: Secondary | ICD-10-CM | POA: Diagnosis not present

## 2022-04-05 DIAGNOSIS — Z1339 Encounter for screening examination for other mental health and behavioral disorders: Secondary | ICD-10-CM | POA: Diagnosis not present

## 2022-04-05 DIAGNOSIS — Z139 Encounter for screening, unspecified: Secondary | ICD-10-CM | POA: Diagnosis not present

## 2022-04-05 DIAGNOSIS — Z0000 Encounter for general adult medical examination without abnormal findings: Secondary | ICD-10-CM | POA: Diagnosis not present

## 2022-04-05 DIAGNOSIS — Z1331 Encounter for screening for depression: Secondary | ICD-10-CM | POA: Diagnosis not present

## 2022-04-05 DIAGNOSIS — Z136 Encounter for screening for cardiovascular disorders: Secondary | ICD-10-CM | POA: Diagnosis not present

## 2022-04-12 DIAGNOSIS — J9602 Acute respiratory failure with hypercapnia: Secondary | ICD-10-CM | POA: Diagnosis not present

## 2022-04-12 DIAGNOSIS — R1312 Dysphagia, oropharyngeal phase: Secondary | ICD-10-CM | POA: Diagnosis not present

## 2022-04-12 DIAGNOSIS — R1314 Dysphagia, pharyngoesophageal phase: Secondary | ICD-10-CM | POA: Diagnosis not present

## 2022-04-12 DIAGNOSIS — R9082 White matter disease, unspecified: Secondary | ICD-10-CM | POA: Diagnosis not present

## 2022-04-12 DIAGNOSIS — R41841 Cognitive communication deficit: Secondary | ICD-10-CM | POA: Diagnosis not present

## 2022-04-12 DIAGNOSIS — S22039D Unspecified fracture of third thoracic vertebra, subsequent encounter for fracture with routine healing: Secondary | ICD-10-CM | POA: Diagnosis not present

## 2022-04-12 DIAGNOSIS — R69 Illness, unspecified: Secondary | ICD-10-CM | POA: Diagnosis not present

## 2022-04-13 DIAGNOSIS — R69 Illness, unspecified: Secondary | ICD-10-CM | POA: Diagnosis not present

## 2022-04-13 DIAGNOSIS — R9082 White matter disease, unspecified: Secondary | ICD-10-CM | POA: Diagnosis not present

## 2022-04-13 DIAGNOSIS — R41841 Cognitive communication deficit: Secondary | ICD-10-CM | POA: Diagnosis not present

## 2022-04-13 DIAGNOSIS — J9602 Acute respiratory failure with hypercapnia: Secondary | ICD-10-CM | POA: Diagnosis not present

## 2022-04-13 DIAGNOSIS — R1314 Dysphagia, pharyngoesophageal phase: Secondary | ICD-10-CM | POA: Diagnosis not present

## 2022-04-13 DIAGNOSIS — R1312 Dysphagia, oropharyngeal phase: Secondary | ICD-10-CM | POA: Diagnosis not present

## 2022-04-13 DIAGNOSIS — S22039D Unspecified fracture of third thoracic vertebra, subsequent encounter for fracture with routine healing: Secondary | ICD-10-CM | POA: Diagnosis not present

## 2022-04-14 DIAGNOSIS — R1314 Dysphagia, pharyngoesophageal phase: Secondary | ICD-10-CM | POA: Diagnosis not present

## 2022-04-14 DIAGNOSIS — R1312 Dysphagia, oropharyngeal phase: Secondary | ICD-10-CM | POA: Diagnosis not present

## 2022-04-14 DIAGNOSIS — R9082 White matter disease, unspecified: Secondary | ICD-10-CM | POA: Diagnosis not present

## 2022-04-14 DIAGNOSIS — R69 Illness, unspecified: Secondary | ICD-10-CM | POA: Diagnosis not present

## 2022-04-14 DIAGNOSIS — R41841 Cognitive communication deficit: Secondary | ICD-10-CM | POA: Diagnosis not present

## 2022-04-14 DIAGNOSIS — J9602 Acute respiratory failure with hypercapnia: Secondary | ICD-10-CM | POA: Diagnosis not present

## 2022-04-14 DIAGNOSIS — S22039D Unspecified fracture of third thoracic vertebra, subsequent encounter for fracture with routine healing: Secondary | ICD-10-CM | POA: Diagnosis not present

## 2022-04-16 DIAGNOSIS — J9602 Acute respiratory failure with hypercapnia: Secondary | ICD-10-CM | POA: Diagnosis not present

## 2022-04-16 DIAGNOSIS — R9082 White matter disease, unspecified: Secondary | ICD-10-CM | POA: Diagnosis not present

## 2022-04-16 DIAGNOSIS — R69 Illness, unspecified: Secondary | ICD-10-CM | POA: Diagnosis not present

## 2022-04-16 DIAGNOSIS — R1312 Dysphagia, oropharyngeal phase: Secondary | ICD-10-CM | POA: Diagnosis not present

## 2022-04-16 DIAGNOSIS — S22039D Unspecified fracture of third thoracic vertebra, subsequent encounter for fracture with routine healing: Secondary | ICD-10-CM | POA: Diagnosis not present

## 2022-04-16 DIAGNOSIS — R41841 Cognitive communication deficit: Secondary | ICD-10-CM | POA: Diagnosis not present

## 2022-04-16 DIAGNOSIS — R1314 Dysphagia, pharyngoesophageal phase: Secondary | ICD-10-CM | POA: Diagnosis not present

## 2022-04-18 DIAGNOSIS — K055 Other periodontal diseases: Secondary | ICD-10-CM | POA: Diagnosis not present

## 2022-04-18 DIAGNOSIS — R69 Illness, unspecified: Secondary | ICD-10-CM | POA: Diagnosis not present

## 2022-04-18 DIAGNOSIS — F5104 Psychophysiologic insomnia: Secondary | ICD-10-CM | POA: Diagnosis not present

## 2022-04-18 DIAGNOSIS — I10 Essential (primary) hypertension: Secondary | ICD-10-CM | POA: Diagnosis not present

## 2022-04-18 DIAGNOSIS — F03C0 Unspecified dementia, severe, without behavioral disturbance, psychotic disturbance, mood disturbance, and anxiety: Secondary | ICD-10-CM | POA: Diagnosis not present

## 2022-04-19 DIAGNOSIS — R9082 White matter disease, unspecified: Secondary | ICD-10-CM | POA: Diagnosis not present

## 2022-04-19 DIAGNOSIS — R69 Illness, unspecified: Secondary | ICD-10-CM | POA: Diagnosis not present

## 2022-04-19 DIAGNOSIS — R1312 Dysphagia, oropharyngeal phase: Secondary | ICD-10-CM | POA: Diagnosis not present

## 2022-04-19 DIAGNOSIS — R41841 Cognitive communication deficit: Secondary | ICD-10-CM | POA: Diagnosis not present

## 2022-04-19 DIAGNOSIS — J9602 Acute respiratory failure with hypercapnia: Secondary | ICD-10-CM | POA: Diagnosis not present

## 2022-04-19 DIAGNOSIS — R1314 Dysphagia, pharyngoesophageal phase: Secondary | ICD-10-CM | POA: Diagnosis not present

## 2022-04-19 DIAGNOSIS — S22039D Unspecified fracture of third thoracic vertebra, subsequent encounter for fracture with routine healing: Secondary | ICD-10-CM | POA: Diagnosis not present

## 2022-04-24 DIAGNOSIS — S22039D Unspecified fracture of third thoracic vertebra, subsequent encounter for fracture with routine healing: Secondary | ICD-10-CM | POA: Diagnosis not present

## 2022-04-24 DIAGNOSIS — R1314 Dysphagia, pharyngoesophageal phase: Secondary | ICD-10-CM | POA: Diagnosis not present

## 2022-04-24 DIAGNOSIS — R41841 Cognitive communication deficit: Secondary | ICD-10-CM | POA: Diagnosis not present

## 2022-04-24 DIAGNOSIS — R1312 Dysphagia, oropharyngeal phase: Secondary | ICD-10-CM | POA: Diagnosis not present

## 2022-04-24 DIAGNOSIS — U071 COVID-19: Secondary | ICD-10-CM | POA: Diagnosis not present

## 2022-04-24 DIAGNOSIS — R262 Difficulty in walking, not elsewhere classified: Secondary | ICD-10-CM | POA: Diagnosis not present

## 2022-04-24 DIAGNOSIS — Z9181 History of falling: Secondary | ICD-10-CM | POA: Diagnosis not present

## 2022-04-24 DIAGNOSIS — R2681 Unsteadiness on feet: Secondary | ICD-10-CM | POA: Diagnosis not present

## 2022-04-24 DIAGNOSIS — M6281 Muscle weakness (generalized): Secondary | ICD-10-CM | POA: Diagnosis not present

## 2022-04-25 DIAGNOSIS — R2681 Unsteadiness on feet: Secondary | ICD-10-CM | POA: Diagnosis not present

## 2022-04-25 DIAGNOSIS — Z9181 History of falling: Secondary | ICD-10-CM | POA: Diagnosis not present

## 2022-04-25 DIAGNOSIS — R262 Difficulty in walking, not elsewhere classified: Secondary | ICD-10-CM | POA: Diagnosis not present

## 2022-04-25 DIAGNOSIS — S22039D Unspecified fracture of third thoracic vertebra, subsequent encounter for fracture with routine healing: Secondary | ICD-10-CM | POA: Diagnosis not present

## 2022-04-25 DIAGNOSIS — M6281 Muscle weakness (generalized): Secondary | ICD-10-CM | POA: Diagnosis not present

## 2022-04-25 DIAGNOSIS — R1314 Dysphagia, pharyngoesophageal phase: Secondary | ICD-10-CM | POA: Diagnosis not present

## 2022-04-25 DIAGNOSIS — R1312 Dysphagia, oropharyngeal phase: Secondary | ICD-10-CM | POA: Diagnosis not present

## 2022-04-25 DIAGNOSIS — R41841 Cognitive communication deficit: Secondary | ICD-10-CM | POA: Diagnosis not present

## 2022-04-28 DIAGNOSIS — R41841 Cognitive communication deficit: Secondary | ICD-10-CM | POA: Diagnosis not present

## 2022-04-28 DIAGNOSIS — Z9181 History of falling: Secondary | ICD-10-CM | POA: Diagnosis not present

## 2022-04-28 DIAGNOSIS — R1314 Dysphagia, pharyngoesophageal phase: Secondary | ICD-10-CM | POA: Diagnosis not present

## 2022-04-28 DIAGNOSIS — R1312 Dysphagia, oropharyngeal phase: Secondary | ICD-10-CM | POA: Diagnosis not present

## 2022-04-28 DIAGNOSIS — S22039D Unspecified fracture of third thoracic vertebra, subsequent encounter for fracture with routine healing: Secondary | ICD-10-CM | POA: Diagnosis not present

## 2022-04-28 DIAGNOSIS — R262 Difficulty in walking, not elsewhere classified: Secondary | ICD-10-CM | POA: Diagnosis not present

## 2022-04-28 DIAGNOSIS — R2681 Unsteadiness on feet: Secondary | ICD-10-CM | POA: Diagnosis not present

## 2022-04-28 DIAGNOSIS — M6281 Muscle weakness (generalized): Secondary | ICD-10-CM | POA: Diagnosis not present

## 2022-05-01 DIAGNOSIS — G301 Alzheimer's disease with late onset: Secondary | ICD-10-CM | POA: Diagnosis not present

## 2022-05-01 DIAGNOSIS — R69 Illness, unspecified: Secondary | ICD-10-CM | POA: Diagnosis not present

## 2022-05-03 DIAGNOSIS — Z9181 History of falling: Secondary | ICD-10-CM | POA: Diagnosis not present

## 2022-05-03 DIAGNOSIS — R41841 Cognitive communication deficit: Secondary | ICD-10-CM | POA: Diagnosis not present

## 2022-05-03 DIAGNOSIS — R262 Difficulty in walking, not elsewhere classified: Secondary | ICD-10-CM | POA: Diagnosis not present

## 2022-05-03 DIAGNOSIS — R1314 Dysphagia, pharyngoesophageal phase: Secondary | ICD-10-CM | POA: Diagnosis not present

## 2022-05-03 DIAGNOSIS — R1312 Dysphagia, oropharyngeal phase: Secondary | ICD-10-CM | POA: Diagnosis not present

## 2022-05-03 DIAGNOSIS — M6281 Muscle weakness (generalized): Secondary | ICD-10-CM | POA: Diagnosis not present

## 2022-05-03 DIAGNOSIS — S22039D Unspecified fracture of third thoracic vertebra, subsequent encounter for fracture with routine healing: Secondary | ICD-10-CM | POA: Diagnosis not present

## 2022-05-03 DIAGNOSIS — R2681 Unsteadiness on feet: Secondary | ICD-10-CM | POA: Diagnosis not present

## 2022-05-06 DIAGNOSIS — S22039D Unspecified fracture of third thoracic vertebra, subsequent encounter for fracture with routine healing: Secondary | ICD-10-CM | POA: Diagnosis not present

## 2022-05-06 DIAGNOSIS — M6281 Muscle weakness (generalized): Secondary | ICD-10-CM | POA: Diagnosis not present

## 2022-05-06 DIAGNOSIS — R1314 Dysphagia, pharyngoesophageal phase: Secondary | ICD-10-CM | POA: Diagnosis not present

## 2022-05-06 DIAGNOSIS — Z9181 History of falling: Secondary | ICD-10-CM | POA: Diagnosis not present

## 2022-05-06 DIAGNOSIS — R1312 Dysphagia, oropharyngeal phase: Secondary | ICD-10-CM | POA: Diagnosis not present

## 2022-05-06 DIAGNOSIS — R2681 Unsteadiness on feet: Secondary | ICD-10-CM | POA: Diagnosis not present

## 2022-05-06 DIAGNOSIS — R41841 Cognitive communication deficit: Secondary | ICD-10-CM | POA: Diagnosis not present

## 2022-05-06 DIAGNOSIS — R262 Difficulty in walking, not elsewhere classified: Secondary | ICD-10-CM | POA: Diagnosis not present

## 2022-05-07 DIAGNOSIS — R2681 Unsteadiness on feet: Secondary | ICD-10-CM | POA: Diagnosis not present

## 2022-05-07 DIAGNOSIS — M6281 Muscle weakness (generalized): Secondary | ICD-10-CM | POA: Diagnosis not present

## 2022-05-07 DIAGNOSIS — R41841 Cognitive communication deficit: Secondary | ICD-10-CM | POA: Diagnosis not present

## 2022-05-07 DIAGNOSIS — R262 Difficulty in walking, not elsewhere classified: Secondary | ICD-10-CM | POA: Diagnosis not present

## 2022-05-07 DIAGNOSIS — S22039D Unspecified fracture of third thoracic vertebra, subsequent encounter for fracture with routine healing: Secondary | ICD-10-CM | POA: Diagnosis not present

## 2022-05-07 DIAGNOSIS — Z9181 History of falling: Secondary | ICD-10-CM | POA: Diagnosis not present

## 2022-05-07 DIAGNOSIS — R1312 Dysphagia, oropharyngeal phase: Secondary | ICD-10-CM | POA: Diagnosis not present

## 2022-05-07 DIAGNOSIS — R1314 Dysphagia, pharyngoesophageal phase: Secondary | ICD-10-CM | POA: Diagnosis not present

## 2022-05-08 DIAGNOSIS — R262 Difficulty in walking, not elsewhere classified: Secondary | ICD-10-CM | POA: Diagnosis not present

## 2022-05-08 DIAGNOSIS — R1312 Dysphagia, oropharyngeal phase: Secondary | ICD-10-CM | POA: Diagnosis not present

## 2022-05-08 DIAGNOSIS — M6281 Muscle weakness (generalized): Secondary | ICD-10-CM | POA: Diagnosis not present

## 2022-05-08 DIAGNOSIS — R2681 Unsteadiness on feet: Secondary | ICD-10-CM | POA: Diagnosis not present

## 2022-05-08 DIAGNOSIS — S22039D Unspecified fracture of third thoracic vertebra, subsequent encounter for fracture with routine healing: Secondary | ICD-10-CM | POA: Diagnosis not present

## 2022-05-08 DIAGNOSIS — R1314 Dysphagia, pharyngoesophageal phase: Secondary | ICD-10-CM | POA: Diagnosis not present

## 2022-05-08 DIAGNOSIS — Z9181 History of falling: Secondary | ICD-10-CM | POA: Diagnosis not present

## 2022-05-08 DIAGNOSIS — R41841 Cognitive communication deficit: Secondary | ICD-10-CM | POA: Diagnosis not present

## 2022-05-09 DIAGNOSIS — Z9181 History of falling: Secondary | ICD-10-CM | POA: Diagnosis not present

## 2022-05-09 DIAGNOSIS — R1314 Dysphagia, pharyngoesophageal phase: Secondary | ICD-10-CM | POA: Diagnosis not present

## 2022-05-09 DIAGNOSIS — M6281 Muscle weakness (generalized): Secondary | ICD-10-CM | POA: Diagnosis not present

## 2022-05-09 DIAGNOSIS — S22039D Unspecified fracture of third thoracic vertebra, subsequent encounter for fracture with routine healing: Secondary | ICD-10-CM | POA: Diagnosis not present

## 2022-05-09 DIAGNOSIS — R262 Difficulty in walking, not elsewhere classified: Secondary | ICD-10-CM | POA: Diagnosis not present

## 2022-05-09 DIAGNOSIS — R2681 Unsteadiness on feet: Secondary | ICD-10-CM | POA: Diagnosis not present

## 2022-05-09 DIAGNOSIS — R41841 Cognitive communication deficit: Secondary | ICD-10-CM | POA: Diagnosis not present

## 2022-05-09 DIAGNOSIS — R1312 Dysphagia, oropharyngeal phase: Secondary | ICD-10-CM | POA: Diagnosis not present

## 2022-05-10 DIAGNOSIS — Z9181 History of falling: Secondary | ICD-10-CM | POA: Diagnosis not present

## 2022-05-10 DIAGNOSIS — M6281 Muscle weakness (generalized): Secondary | ICD-10-CM | POA: Diagnosis not present

## 2022-05-10 DIAGNOSIS — R41841 Cognitive communication deficit: Secondary | ICD-10-CM | POA: Diagnosis not present

## 2022-05-10 DIAGNOSIS — R1312 Dysphagia, oropharyngeal phase: Secondary | ICD-10-CM | POA: Diagnosis not present

## 2022-05-10 DIAGNOSIS — R2681 Unsteadiness on feet: Secondary | ICD-10-CM | POA: Diagnosis not present

## 2022-05-10 DIAGNOSIS — R262 Difficulty in walking, not elsewhere classified: Secondary | ICD-10-CM | POA: Diagnosis not present

## 2022-05-10 DIAGNOSIS — R1314 Dysphagia, pharyngoesophageal phase: Secondary | ICD-10-CM | POA: Diagnosis not present

## 2022-05-10 DIAGNOSIS — S22039D Unspecified fracture of third thoracic vertebra, subsequent encounter for fracture with routine healing: Secondary | ICD-10-CM | POA: Diagnosis not present

## 2022-05-11 DIAGNOSIS — R262 Difficulty in walking, not elsewhere classified: Secondary | ICD-10-CM | POA: Diagnosis not present

## 2022-05-11 DIAGNOSIS — M6281 Muscle weakness (generalized): Secondary | ICD-10-CM | POA: Diagnosis not present

## 2022-05-11 DIAGNOSIS — R2681 Unsteadiness on feet: Secondary | ICD-10-CM | POA: Diagnosis not present

## 2022-05-11 DIAGNOSIS — R1314 Dysphagia, pharyngoesophageal phase: Secondary | ICD-10-CM | POA: Diagnosis not present

## 2022-05-11 DIAGNOSIS — S22039D Unspecified fracture of third thoracic vertebra, subsequent encounter for fracture with routine healing: Secondary | ICD-10-CM | POA: Diagnosis not present

## 2022-05-11 DIAGNOSIS — R1312 Dysphagia, oropharyngeal phase: Secondary | ICD-10-CM | POA: Diagnosis not present

## 2022-05-11 DIAGNOSIS — R41841 Cognitive communication deficit: Secondary | ICD-10-CM | POA: Diagnosis not present

## 2022-05-11 DIAGNOSIS — Z9181 History of falling: Secondary | ICD-10-CM | POA: Diagnosis not present

## 2022-05-12 DIAGNOSIS — R1312 Dysphagia, oropharyngeal phase: Secondary | ICD-10-CM | POA: Diagnosis not present

## 2022-05-12 DIAGNOSIS — R262 Difficulty in walking, not elsewhere classified: Secondary | ICD-10-CM | POA: Diagnosis not present

## 2022-05-12 DIAGNOSIS — Z9181 History of falling: Secondary | ICD-10-CM | POA: Diagnosis not present

## 2022-05-12 DIAGNOSIS — S22039D Unspecified fracture of third thoracic vertebra, subsequent encounter for fracture with routine healing: Secondary | ICD-10-CM | POA: Diagnosis not present

## 2022-05-12 DIAGNOSIS — R2681 Unsteadiness on feet: Secondary | ICD-10-CM | POA: Diagnosis not present

## 2022-05-12 DIAGNOSIS — R1314 Dysphagia, pharyngoesophageal phase: Secondary | ICD-10-CM | POA: Diagnosis not present

## 2022-05-12 DIAGNOSIS — M6281 Muscle weakness (generalized): Secondary | ICD-10-CM | POA: Diagnosis not present

## 2022-05-12 DIAGNOSIS — R41841 Cognitive communication deficit: Secondary | ICD-10-CM | POA: Diagnosis not present

## 2022-05-15 DIAGNOSIS — R262 Difficulty in walking, not elsewhere classified: Secondary | ICD-10-CM | POA: Diagnosis not present

## 2022-05-15 DIAGNOSIS — M6281 Muscle weakness (generalized): Secondary | ICD-10-CM | POA: Diagnosis not present

## 2022-05-15 DIAGNOSIS — R2681 Unsteadiness on feet: Secondary | ICD-10-CM | POA: Diagnosis not present

## 2022-05-15 DIAGNOSIS — Z9181 History of falling: Secondary | ICD-10-CM | POA: Diagnosis not present

## 2022-05-15 DIAGNOSIS — S22039D Unspecified fracture of third thoracic vertebra, subsequent encounter for fracture with routine healing: Secondary | ICD-10-CM | POA: Diagnosis not present

## 2022-05-15 DIAGNOSIS — R1314 Dysphagia, pharyngoesophageal phase: Secondary | ICD-10-CM | POA: Diagnosis not present

## 2022-05-15 DIAGNOSIS — R1312 Dysphagia, oropharyngeal phase: Secondary | ICD-10-CM | POA: Diagnosis not present

## 2022-05-15 DIAGNOSIS — R41841 Cognitive communication deficit: Secondary | ICD-10-CM | POA: Diagnosis not present

## 2022-05-16 DIAGNOSIS — Z9181 History of falling: Secondary | ICD-10-CM | POA: Diagnosis not present

## 2022-05-16 DIAGNOSIS — M6281 Muscle weakness (generalized): Secondary | ICD-10-CM | POA: Diagnosis not present

## 2022-05-16 DIAGNOSIS — R41841 Cognitive communication deficit: Secondary | ICD-10-CM | POA: Diagnosis not present

## 2022-05-16 DIAGNOSIS — R262 Difficulty in walking, not elsewhere classified: Secondary | ICD-10-CM | POA: Diagnosis not present

## 2022-05-16 DIAGNOSIS — S22039D Unspecified fracture of third thoracic vertebra, subsequent encounter for fracture with routine healing: Secondary | ICD-10-CM | POA: Diagnosis not present

## 2022-05-16 DIAGNOSIS — R1312 Dysphagia, oropharyngeal phase: Secondary | ICD-10-CM | POA: Diagnosis not present

## 2022-05-16 DIAGNOSIS — R1314 Dysphagia, pharyngoesophageal phase: Secondary | ICD-10-CM | POA: Diagnosis not present

## 2022-05-16 DIAGNOSIS — R2681 Unsteadiness on feet: Secondary | ICD-10-CM | POA: Diagnosis not present

## 2022-05-17 DIAGNOSIS — R262 Difficulty in walking, not elsewhere classified: Secondary | ICD-10-CM | POA: Diagnosis not present

## 2022-05-17 DIAGNOSIS — M6281 Muscle weakness (generalized): Secondary | ICD-10-CM | POA: Diagnosis not present

## 2022-05-17 DIAGNOSIS — Z9181 History of falling: Secondary | ICD-10-CM | POA: Diagnosis not present

## 2022-05-17 DIAGNOSIS — R2681 Unsteadiness on feet: Secondary | ICD-10-CM | POA: Diagnosis not present

## 2022-05-17 DIAGNOSIS — R1314 Dysphagia, pharyngoesophageal phase: Secondary | ICD-10-CM | POA: Diagnosis not present

## 2022-05-17 DIAGNOSIS — S22039D Unspecified fracture of third thoracic vertebra, subsequent encounter for fracture with routine healing: Secondary | ICD-10-CM | POA: Diagnosis not present

## 2022-05-17 DIAGNOSIS — R1312 Dysphagia, oropharyngeal phase: Secondary | ICD-10-CM | POA: Diagnosis not present

## 2022-05-17 DIAGNOSIS — R41841 Cognitive communication deficit: Secondary | ICD-10-CM | POA: Diagnosis not present

## 2022-05-18 DIAGNOSIS — R1312 Dysphagia, oropharyngeal phase: Secondary | ICD-10-CM | POA: Diagnosis not present

## 2022-05-18 DIAGNOSIS — R1314 Dysphagia, pharyngoesophageal phase: Secondary | ICD-10-CM | POA: Diagnosis not present

## 2022-05-18 DIAGNOSIS — R2681 Unsteadiness on feet: Secondary | ICD-10-CM | POA: Diagnosis not present

## 2022-05-18 DIAGNOSIS — S22039D Unspecified fracture of third thoracic vertebra, subsequent encounter for fracture with routine healing: Secondary | ICD-10-CM | POA: Diagnosis not present

## 2022-05-18 DIAGNOSIS — M6281 Muscle weakness (generalized): Secondary | ICD-10-CM | POA: Diagnosis not present

## 2022-05-18 DIAGNOSIS — R41841 Cognitive communication deficit: Secondary | ICD-10-CM | POA: Diagnosis not present

## 2022-05-18 DIAGNOSIS — Z9181 History of falling: Secondary | ICD-10-CM | POA: Diagnosis not present

## 2022-05-18 DIAGNOSIS — R262 Difficulty in walking, not elsewhere classified: Secondary | ICD-10-CM | POA: Diagnosis not present

## 2022-05-19 DIAGNOSIS — M6281 Muscle weakness (generalized): Secondary | ICD-10-CM | POA: Diagnosis not present

## 2022-05-19 DIAGNOSIS — R1312 Dysphagia, oropharyngeal phase: Secondary | ICD-10-CM | POA: Diagnosis not present

## 2022-05-19 DIAGNOSIS — S22039D Unspecified fracture of third thoracic vertebra, subsequent encounter for fracture with routine healing: Secondary | ICD-10-CM | POA: Diagnosis not present

## 2022-05-19 DIAGNOSIS — Z9181 History of falling: Secondary | ICD-10-CM | POA: Diagnosis not present

## 2022-05-19 DIAGNOSIS — R262 Difficulty in walking, not elsewhere classified: Secondary | ICD-10-CM | POA: Diagnosis not present

## 2022-05-19 DIAGNOSIS — R2681 Unsteadiness on feet: Secondary | ICD-10-CM | POA: Diagnosis not present

## 2022-05-19 DIAGNOSIS — R1314 Dysphagia, pharyngoesophageal phase: Secondary | ICD-10-CM | POA: Diagnosis not present

## 2022-05-19 DIAGNOSIS — R41841 Cognitive communication deficit: Secondary | ICD-10-CM | POA: Diagnosis not present

## 2022-05-21 DIAGNOSIS — F32A Depression, unspecified: Secondary | ICD-10-CM | POA: Diagnosis not present

## 2022-05-21 DIAGNOSIS — R41841 Cognitive communication deficit: Secondary | ICD-10-CM | POA: Diagnosis not present

## 2022-05-21 DIAGNOSIS — R69 Illness, unspecified: Secondary | ICD-10-CM | POA: Diagnosis not present

## 2022-05-21 DIAGNOSIS — R262 Difficulty in walking, not elsewhere classified: Secondary | ICD-10-CM | POA: Diagnosis not present

## 2022-05-21 DIAGNOSIS — R1312 Dysphagia, oropharyngeal phase: Secondary | ICD-10-CM | POA: Diagnosis not present

## 2022-05-21 DIAGNOSIS — R1314 Dysphagia, pharyngoesophageal phase: Secondary | ICD-10-CM | POA: Diagnosis not present

## 2022-05-21 DIAGNOSIS — S22039D Unspecified fracture of third thoracic vertebra, subsequent encounter for fracture with routine healing: Secondary | ICD-10-CM | POA: Diagnosis not present

## 2022-05-21 DIAGNOSIS — I10 Essential (primary) hypertension: Secondary | ICD-10-CM | POA: Diagnosis not present

## 2022-05-21 DIAGNOSIS — M6281 Muscle weakness (generalized): Secondary | ICD-10-CM | POA: Diagnosis not present

## 2022-05-21 DIAGNOSIS — Z9181 History of falling: Secondary | ICD-10-CM | POA: Diagnosis not present

## 2022-05-21 DIAGNOSIS — F03C0 Unspecified dementia, severe, without behavioral disturbance, psychotic disturbance, mood disturbance, and anxiety: Secondary | ICD-10-CM | POA: Diagnosis not present

## 2022-05-21 DIAGNOSIS — G935 Compression of brain: Secondary | ICD-10-CM | POA: Diagnosis not present

## 2022-05-21 DIAGNOSIS — R2681 Unsteadiness on feet: Secondary | ICD-10-CM | POA: Diagnosis not present

## 2022-05-22 DIAGNOSIS — S22039D Unspecified fracture of third thoracic vertebra, subsequent encounter for fracture with routine healing: Secondary | ICD-10-CM | POA: Diagnosis not present

## 2022-05-22 DIAGNOSIS — R2681 Unsteadiness on feet: Secondary | ICD-10-CM | POA: Diagnosis not present

## 2022-05-22 DIAGNOSIS — R1312 Dysphagia, oropharyngeal phase: Secondary | ICD-10-CM | POA: Diagnosis not present

## 2022-05-22 DIAGNOSIS — R262 Difficulty in walking, not elsewhere classified: Secondary | ICD-10-CM | POA: Diagnosis not present

## 2022-05-22 DIAGNOSIS — M6281 Muscle weakness (generalized): Secondary | ICD-10-CM | POA: Diagnosis not present

## 2022-05-22 DIAGNOSIS — R1314 Dysphagia, pharyngoesophageal phase: Secondary | ICD-10-CM | POA: Diagnosis not present

## 2022-05-22 DIAGNOSIS — Z9181 History of falling: Secondary | ICD-10-CM | POA: Diagnosis not present

## 2022-05-22 DIAGNOSIS — R41841 Cognitive communication deficit: Secondary | ICD-10-CM | POA: Diagnosis not present

## 2022-05-23 DIAGNOSIS — Z9181 History of falling: Secondary | ICD-10-CM | POA: Diagnosis not present

## 2022-05-23 DIAGNOSIS — R1310 Dysphagia, unspecified: Secondary | ICD-10-CM | POA: Diagnosis not present

## 2022-05-23 DIAGNOSIS — M6281 Muscle weakness (generalized): Secondary | ICD-10-CM | POA: Diagnosis not present

## 2022-05-23 DIAGNOSIS — R41841 Cognitive communication deficit: Secondary | ICD-10-CM | POA: Diagnosis not present

## 2022-05-23 DIAGNOSIS — R1314 Dysphagia, pharyngoesophageal phase: Secondary | ICD-10-CM | POA: Diagnosis not present

## 2022-05-23 DIAGNOSIS — R262 Difficulty in walking, not elsewhere classified: Secondary | ICD-10-CM | POA: Diagnosis not present

## 2022-05-23 DIAGNOSIS — R2681 Unsteadiness on feet: Secondary | ICD-10-CM | POA: Diagnosis not present

## 2022-05-24 DIAGNOSIS — R262 Difficulty in walking, not elsewhere classified: Secondary | ICD-10-CM | POA: Diagnosis not present

## 2022-05-24 DIAGNOSIS — R1310 Dysphagia, unspecified: Secondary | ICD-10-CM | POA: Diagnosis not present

## 2022-05-24 DIAGNOSIS — Z9181 History of falling: Secondary | ICD-10-CM | POA: Diagnosis not present

## 2022-05-24 DIAGNOSIS — M6281 Muscle weakness (generalized): Secondary | ICD-10-CM | POA: Diagnosis not present

## 2022-05-24 DIAGNOSIS — R2681 Unsteadiness on feet: Secondary | ICD-10-CM | POA: Diagnosis not present

## 2022-05-24 DIAGNOSIS — R41841 Cognitive communication deficit: Secondary | ICD-10-CM | POA: Diagnosis not present

## 2022-05-25 DIAGNOSIS — R262 Difficulty in walking, not elsewhere classified: Secondary | ICD-10-CM | POA: Diagnosis not present

## 2022-05-25 DIAGNOSIS — Z9181 History of falling: Secondary | ICD-10-CM | POA: Diagnosis not present

## 2022-05-25 DIAGNOSIS — R2681 Unsteadiness on feet: Secondary | ICD-10-CM | POA: Diagnosis not present

## 2022-05-25 DIAGNOSIS — R1310 Dysphagia, unspecified: Secondary | ICD-10-CM | POA: Diagnosis not present

## 2022-05-25 DIAGNOSIS — R41841 Cognitive communication deficit: Secondary | ICD-10-CM | POA: Diagnosis not present

## 2022-05-25 DIAGNOSIS — M6281 Muscle weakness (generalized): Secondary | ICD-10-CM | POA: Diagnosis not present

## 2022-05-26 DIAGNOSIS — R41841 Cognitive communication deficit: Secondary | ICD-10-CM | POA: Diagnosis not present

## 2022-05-26 DIAGNOSIS — R262 Difficulty in walking, not elsewhere classified: Secondary | ICD-10-CM | POA: Diagnosis not present

## 2022-05-26 DIAGNOSIS — Z9181 History of falling: Secondary | ICD-10-CM | POA: Diagnosis not present

## 2022-05-26 DIAGNOSIS — M6281 Muscle weakness (generalized): Secondary | ICD-10-CM | POA: Diagnosis not present

## 2022-05-26 DIAGNOSIS — R2681 Unsteadiness on feet: Secondary | ICD-10-CM | POA: Diagnosis not present

## 2022-05-26 DIAGNOSIS — R1310 Dysphagia, unspecified: Secondary | ICD-10-CM | POA: Diagnosis not present

## 2022-05-28 DIAGNOSIS — R41841 Cognitive communication deficit: Secondary | ICD-10-CM | POA: Diagnosis not present

## 2022-05-28 DIAGNOSIS — R2681 Unsteadiness on feet: Secondary | ICD-10-CM | POA: Diagnosis not present

## 2022-05-28 DIAGNOSIS — R262 Difficulty in walking, not elsewhere classified: Secondary | ICD-10-CM | POA: Diagnosis not present

## 2022-05-28 DIAGNOSIS — R1310 Dysphagia, unspecified: Secondary | ICD-10-CM | POA: Diagnosis not present

## 2022-05-28 DIAGNOSIS — Z9181 History of falling: Secondary | ICD-10-CM | POA: Diagnosis not present

## 2022-05-28 DIAGNOSIS — M6281 Muscle weakness (generalized): Secondary | ICD-10-CM | POA: Diagnosis not present

## 2022-05-29 DIAGNOSIS — R1310 Dysphagia, unspecified: Secondary | ICD-10-CM | POA: Diagnosis not present

## 2022-05-29 DIAGNOSIS — F02C3 Dementia in other diseases classified elsewhere, severe, with mood disturbance: Secondary | ICD-10-CM | POA: Diagnosis not present

## 2022-05-29 DIAGNOSIS — R41841 Cognitive communication deficit: Secondary | ICD-10-CM | POA: Diagnosis not present

## 2022-05-29 DIAGNOSIS — G301 Alzheimer's disease with late onset: Secondary | ICD-10-CM | POA: Diagnosis not present

## 2022-05-29 DIAGNOSIS — F331 Major depressive disorder, recurrent, moderate: Secondary | ICD-10-CM | POA: Diagnosis not present

## 2022-05-29 DIAGNOSIS — F419 Anxiety disorder, unspecified: Secondary | ICD-10-CM | POA: Diagnosis not present

## 2022-05-29 DIAGNOSIS — M6281 Muscle weakness (generalized): Secondary | ICD-10-CM | POA: Diagnosis not present

## 2022-05-29 DIAGNOSIS — R2681 Unsteadiness on feet: Secondary | ICD-10-CM | POA: Diagnosis not present

## 2022-05-29 DIAGNOSIS — Z9181 History of falling: Secondary | ICD-10-CM | POA: Diagnosis not present

## 2022-05-29 DIAGNOSIS — R262 Difficulty in walking, not elsewhere classified: Secondary | ICD-10-CM | POA: Diagnosis not present

## 2022-05-29 DIAGNOSIS — F5101 Primary insomnia: Secondary | ICD-10-CM | POA: Diagnosis not present

## 2022-05-30 DIAGNOSIS — Z9181 History of falling: Secondary | ICD-10-CM | POA: Diagnosis not present

## 2022-05-30 DIAGNOSIS — M6281 Muscle weakness (generalized): Secondary | ICD-10-CM | POA: Diagnosis not present

## 2022-05-30 DIAGNOSIS — R41841 Cognitive communication deficit: Secondary | ICD-10-CM | POA: Diagnosis not present

## 2022-05-30 DIAGNOSIS — R262 Difficulty in walking, not elsewhere classified: Secondary | ICD-10-CM | POA: Diagnosis not present

## 2022-05-30 DIAGNOSIS — R2681 Unsteadiness on feet: Secondary | ICD-10-CM | POA: Diagnosis not present

## 2022-05-30 DIAGNOSIS — R1310 Dysphagia, unspecified: Secondary | ICD-10-CM | POA: Diagnosis not present

## 2022-05-31 DIAGNOSIS — Z9181 History of falling: Secondary | ICD-10-CM | POA: Diagnosis not present

## 2022-05-31 DIAGNOSIS — R41841 Cognitive communication deficit: Secondary | ICD-10-CM | POA: Diagnosis not present

## 2022-05-31 DIAGNOSIS — M6281 Muscle weakness (generalized): Secondary | ICD-10-CM | POA: Diagnosis not present

## 2022-05-31 DIAGNOSIS — R2681 Unsteadiness on feet: Secondary | ICD-10-CM | POA: Diagnosis not present

## 2022-05-31 DIAGNOSIS — R262 Difficulty in walking, not elsewhere classified: Secondary | ICD-10-CM | POA: Diagnosis not present

## 2022-05-31 DIAGNOSIS — R1310 Dysphagia, unspecified: Secondary | ICD-10-CM | POA: Diagnosis not present

## 2022-06-01 DIAGNOSIS — M6281 Muscle weakness (generalized): Secondary | ICD-10-CM | POA: Diagnosis not present

## 2022-06-01 DIAGNOSIS — Z9181 History of falling: Secondary | ICD-10-CM | POA: Diagnosis not present

## 2022-06-01 DIAGNOSIS — R262 Difficulty in walking, not elsewhere classified: Secondary | ICD-10-CM | POA: Diagnosis not present

## 2022-06-01 DIAGNOSIS — R1310 Dysphagia, unspecified: Secondary | ICD-10-CM | POA: Diagnosis not present

## 2022-06-01 DIAGNOSIS — R41841 Cognitive communication deficit: Secondary | ICD-10-CM | POA: Diagnosis not present

## 2022-06-01 DIAGNOSIS — R2681 Unsteadiness on feet: Secondary | ICD-10-CM | POA: Diagnosis not present

## 2022-06-02 DIAGNOSIS — R262 Difficulty in walking, not elsewhere classified: Secondary | ICD-10-CM | POA: Diagnosis not present

## 2022-06-02 DIAGNOSIS — Z9181 History of falling: Secondary | ICD-10-CM | POA: Diagnosis not present

## 2022-06-02 DIAGNOSIS — R41841 Cognitive communication deficit: Secondary | ICD-10-CM | POA: Diagnosis not present

## 2022-06-02 DIAGNOSIS — M6281 Muscle weakness (generalized): Secondary | ICD-10-CM | POA: Diagnosis not present

## 2022-06-02 DIAGNOSIS — R2681 Unsteadiness on feet: Secondary | ICD-10-CM | POA: Diagnosis not present

## 2022-06-02 DIAGNOSIS — R1310 Dysphagia, unspecified: Secondary | ICD-10-CM | POA: Diagnosis not present

## 2022-06-03 DIAGNOSIS — R1310 Dysphagia, unspecified: Secondary | ICD-10-CM | POA: Diagnosis not present

## 2022-06-03 DIAGNOSIS — R2681 Unsteadiness on feet: Secondary | ICD-10-CM | POA: Diagnosis not present

## 2022-06-03 DIAGNOSIS — M6281 Muscle weakness (generalized): Secondary | ICD-10-CM | POA: Diagnosis not present

## 2022-06-03 DIAGNOSIS — Z9181 History of falling: Secondary | ICD-10-CM | POA: Diagnosis not present

## 2022-06-03 DIAGNOSIS — R41841 Cognitive communication deficit: Secondary | ICD-10-CM | POA: Diagnosis not present

## 2022-06-03 DIAGNOSIS — R262 Difficulty in walking, not elsewhere classified: Secondary | ICD-10-CM | POA: Diagnosis not present

## 2022-06-04 DIAGNOSIS — R262 Difficulty in walking, not elsewhere classified: Secondary | ICD-10-CM | POA: Diagnosis not present

## 2022-06-04 DIAGNOSIS — R2681 Unsteadiness on feet: Secondary | ICD-10-CM | POA: Diagnosis not present

## 2022-06-04 DIAGNOSIS — R41841 Cognitive communication deficit: Secondary | ICD-10-CM | POA: Diagnosis not present

## 2022-06-04 DIAGNOSIS — R1310 Dysphagia, unspecified: Secondary | ICD-10-CM | POA: Diagnosis not present

## 2022-06-04 DIAGNOSIS — Z9181 History of falling: Secondary | ICD-10-CM | POA: Diagnosis not present

## 2022-06-04 DIAGNOSIS — M6281 Muscle weakness (generalized): Secondary | ICD-10-CM | POA: Diagnosis not present

## 2022-06-05 DIAGNOSIS — R2681 Unsteadiness on feet: Secondary | ICD-10-CM | POA: Diagnosis not present

## 2022-06-05 DIAGNOSIS — Z9181 History of falling: Secondary | ICD-10-CM | POA: Diagnosis not present

## 2022-06-05 DIAGNOSIS — R262 Difficulty in walking, not elsewhere classified: Secondary | ICD-10-CM | POA: Diagnosis not present

## 2022-06-05 DIAGNOSIS — R41841 Cognitive communication deficit: Secondary | ICD-10-CM | POA: Diagnosis not present

## 2022-06-05 DIAGNOSIS — R1310 Dysphagia, unspecified: Secondary | ICD-10-CM | POA: Diagnosis not present

## 2022-06-05 DIAGNOSIS — M6281 Muscle weakness (generalized): Secondary | ICD-10-CM | POA: Diagnosis not present

## 2022-06-06 DIAGNOSIS — M6281 Muscle weakness (generalized): Secondary | ICD-10-CM | POA: Diagnosis not present

## 2022-06-06 DIAGNOSIS — R41841 Cognitive communication deficit: Secondary | ICD-10-CM | POA: Diagnosis not present

## 2022-06-06 DIAGNOSIS — R2681 Unsteadiness on feet: Secondary | ICD-10-CM | POA: Diagnosis not present

## 2022-06-06 DIAGNOSIS — Z9181 History of falling: Secondary | ICD-10-CM | POA: Diagnosis not present

## 2022-06-06 DIAGNOSIS — R1310 Dysphagia, unspecified: Secondary | ICD-10-CM | POA: Diagnosis not present

## 2022-06-06 DIAGNOSIS — R262 Difficulty in walking, not elsewhere classified: Secondary | ICD-10-CM | POA: Diagnosis not present

## 2022-06-07 DIAGNOSIS — R262 Difficulty in walking, not elsewhere classified: Secondary | ICD-10-CM | POA: Diagnosis not present

## 2022-06-07 DIAGNOSIS — R41841 Cognitive communication deficit: Secondary | ICD-10-CM | POA: Diagnosis not present

## 2022-06-07 DIAGNOSIS — R1310 Dysphagia, unspecified: Secondary | ICD-10-CM | POA: Diagnosis not present

## 2022-06-07 DIAGNOSIS — Z9181 History of falling: Secondary | ICD-10-CM | POA: Diagnosis not present

## 2022-06-07 DIAGNOSIS — M6281 Muscle weakness (generalized): Secondary | ICD-10-CM | POA: Diagnosis not present

## 2022-06-07 DIAGNOSIS — R2681 Unsteadiness on feet: Secondary | ICD-10-CM | POA: Diagnosis not present

## 2022-06-08 DIAGNOSIS — R41841 Cognitive communication deficit: Secondary | ICD-10-CM | POA: Diagnosis not present

## 2022-06-08 DIAGNOSIS — R2681 Unsteadiness on feet: Secondary | ICD-10-CM | POA: Diagnosis not present

## 2022-06-08 DIAGNOSIS — Z9181 History of falling: Secondary | ICD-10-CM | POA: Diagnosis not present

## 2022-06-08 DIAGNOSIS — M6281 Muscle weakness (generalized): Secondary | ICD-10-CM | POA: Diagnosis not present

## 2022-06-08 DIAGNOSIS — R1310 Dysphagia, unspecified: Secondary | ICD-10-CM | POA: Diagnosis not present

## 2022-06-08 DIAGNOSIS — R262 Difficulty in walking, not elsewhere classified: Secondary | ICD-10-CM | POA: Diagnosis not present

## 2022-06-09 DIAGNOSIS — R1310 Dysphagia, unspecified: Secondary | ICD-10-CM | POA: Diagnosis not present

## 2022-06-09 DIAGNOSIS — R262 Difficulty in walking, not elsewhere classified: Secondary | ICD-10-CM | POA: Diagnosis not present

## 2022-06-09 DIAGNOSIS — M6281 Muscle weakness (generalized): Secondary | ICD-10-CM | POA: Diagnosis not present

## 2022-06-09 DIAGNOSIS — R2681 Unsteadiness on feet: Secondary | ICD-10-CM | POA: Diagnosis not present

## 2022-06-09 DIAGNOSIS — R41841 Cognitive communication deficit: Secondary | ICD-10-CM | POA: Diagnosis not present

## 2022-06-09 DIAGNOSIS — Z9181 History of falling: Secondary | ICD-10-CM | POA: Diagnosis not present

## 2022-06-10 DIAGNOSIS — M6281 Muscle weakness (generalized): Secondary | ICD-10-CM | POA: Diagnosis not present

## 2022-06-10 DIAGNOSIS — R2681 Unsteadiness on feet: Secondary | ICD-10-CM | POA: Diagnosis not present

## 2022-06-10 DIAGNOSIS — R41841 Cognitive communication deficit: Secondary | ICD-10-CM | POA: Diagnosis not present

## 2022-06-10 DIAGNOSIS — Z9181 History of falling: Secondary | ICD-10-CM | POA: Diagnosis not present

## 2022-06-10 DIAGNOSIS — R1310 Dysphagia, unspecified: Secondary | ICD-10-CM | POA: Diagnosis not present

## 2022-06-10 DIAGNOSIS — R262 Difficulty in walking, not elsewhere classified: Secondary | ICD-10-CM | POA: Diagnosis not present

## 2022-06-11 DIAGNOSIS — R1310 Dysphagia, unspecified: Secondary | ICD-10-CM | POA: Diagnosis not present

## 2022-06-11 DIAGNOSIS — R2681 Unsteadiness on feet: Secondary | ICD-10-CM | POA: Diagnosis not present

## 2022-06-11 DIAGNOSIS — R41841 Cognitive communication deficit: Secondary | ICD-10-CM | POA: Diagnosis not present

## 2022-06-11 DIAGNOSIS — R262 Difficulty in walking, not elsewhere classified: Secondary | ICD-10-CM | POA: Diagnosis not present

## 2022-06-11 DIAGNOSIS — M6281 Muscle weakness (generalized): Secondary | ICD-10-CM | POA: Diagnosis not present

## 2022-06-11 DIAGNOSIS — Z9181 History of falling: Secondary | ICD-10-CM | POA: Diagnosis not present

## 2022-06-12 DIAGNOSIS — R1310 Dysphagia, unspecified: Secondary | ICD-10-CM | POA: Diagnosis not present

## 2022-06-12 DIAGNOSIS — Z9181 History of falling: Secondary | ICD-10-CM | POA: Diagnosis not present

## 2022-06-12 DIAGNOSIS — R2681 Unsteadiness on feet: Secondary | ICD-10-CM | POA: Diagnosis not present

## 2022-06-12 DIAGNOSIS — M6281 Muscle weakness (generalized): Secondary | ICD-10-CM | POA: Diagnosis not present

## 2022-06-12 DIAGNOSIS — R262 Difficulty in walking, not elsewhere classified: Secondary | ICD-10-CM | POA: Diagnosis not present

## 2022-06-12 DIAGNOSIS — R41841 Cognitive communication deficit: Secondary | ICD-10-CM | POA: Diagnosis not present

## 2022-06-13 DIAGNOSIS — Z9181 History of falling: Secondary | ICD-10-CM | POA: Diagnosis not present

## 2022-06-13 DIAGNOSIS — R41841 Cognitive communication deficit: Secondary | ICD-10-CM | POA: Diagnosis not present

## 2022-06-13 DIAGNOSIS — R4182 Altered mental status, unspecified: Secondary | ICD-10-CM | POA: Diagnosis not present

## 2022-06-13 DIAGNOSIS — M6281 Muscle weakness (generalized): Secondary | ICD-10-CM | POA: Diagnosis not present

## 2022-06-13 DIAGNOSIS — R2681 Unsteadiness on feet: Secondary | ICD-10-CM | POA: Diagnosis not present

## 2022-06-13 DIAGNOSIS — R1310 Dysphagia, unspecified: Secondary | ICD-10-CM | POA: Diagnosis not present

## 2022-06-13 DIAGNOSIS — R262 Difficulty in walking, not elsewhere classified: Secondary | ICD-10-CM | POA: Diagnosis not present

## 2022-06-15 DIAGNOSIS — M6281 Muscle weakness (generalized): Secondary | ICD-10-CM | POA: Diagnosis not present

## 2022-06-15 DIAGNOSIS — R2681 Unsteadiness on feet: Secondary | ICD-10-CM | POA: Diagnosis not present

## 2022-06-15 DIAGNOSIS — R41841 Cognitive communication deficit: Secondary | ICD-10-CM | POA: Diagnosis not present

## 2022-06-15 DIAGNOSIS — R262 Difficulty in walking, not elsewhere classified: Secondary | ICD-10-CM | POA: Diagnosis not present

## 2022-06-15 DIAGNOSIS — R1310 Dysphagia, unspecified: Secondary | ICD-10-CM | POA: Diagnosis not present

## 2022-06-15 DIAGNOSIS — Z9181 History of falling: Secondary | ICD-10-CM | POA: Diagnosis not present

## 2022-06-16 DIAGNOSIS — R41841 Cognitive communication deficit: Secondary | ICD-10-CM | POA: Diagnosis not present

## 2022-06-16 DIAGNOSIS — M6281 Muscle weakness (generalized): Secondary | ICD-10-CM | POA: Diagnosis not present

## 2022-06-16 DIAGNOSIS — R262 Difficulty in walking, not elsewhere classified: Secondary | ICD-10-CM | POA: Diagnosis not present

## 2022-06-16 DIAGNOSIS — R1310 Dysphagia, unspecified: Secondary | ICD-10-CM | POA: Diagnosis not present

## 2022-06-16 DIAGNOSIS — R2681 Unsteadiness on feet: Secondary | ICD-10-CM | POA: Diagnosis not present

## 2022-06-16 DIAGNOSIS — Z9181 History of falling: Secondary | ICD-10-CM | POA: Diagnosis not present

## 2022-06-17 DIAGNOSIS — M6281 Muscle weakness (generalized): Secondary | ICD-10-CM | POA: Diagnosis not present

## 2022-06-17 DIAGNOSIS — Z9181 History of falling: Secondary | ICD-10-CM | POA: Diagnosis not present

## 2022-06-17 DIAGNOSIS — R262 Difficulty in walking, not elsewhere classified: Secondary | ICD-10-CM | POA: Diagnosis not present

## 2022-06-17 DIAGNOSIS — R41841 Cognitive communication deficit: Secondary | ICD-10-CM | POA: Diagnosis not present

## 2022-06-17 DIAGNOSIS — R2681 Unsteadiness on feet: Secondary | ICD-10-CM | POA: Diagnosis not present

## 2022-06-17 DIAGNOSIS — R1310 Dysphagia, unspecified: Secondary | ICD-10-CM | POA: Diagnosis not present

## 2022-06-18 DIAGNOSIS — R2681 Unsteadiness on feet: Secondary | ICD-10-CM | POA: Diagnosis not present

## 2022-06-18 DIAGNOSIS — M6281 Muscle weakness (generalized): Secondary | ICD-10-CM | POA: Diagnosis not present

## 2022-06-18 DIAGNOSIS — R262 Difficulty in walking, not elsewhere classified: Secondary | ICD-10-CM | POA: Diagnosis not present

## 2022-06-18 DIAGNOSIS — R41841 Cognitive communication deficit: Secondary | ICD-10-CM | POA: Diagnosis not present

## 2022-06-18 DIAGNOSIS — Z9181 History of falling: Secondary | ICD-10-CM | POA: Diagnosis not present

## 2022-06-18 DIAGNOSIS — R1310 Dysphagia, unspecified: Secondary | ICD-10-CM | POA: Diagnosis not present

## 2022-06-19 DIAGNOSIS — R1310 Dysphagia, unspecified: Secondary | ICD-10-CM | POA: Diagnosis not present

## 2022-06-19 DIAGNOSIS — R2681 Unsteadiness on feet: Secondary | ICD-10-CM | POA: Diagnosis not present

## 2022-06-19 DIAGNOSIS — R41841 Cognitive communication deficit: Secondary | ICD-10-CM | POA: Diagnosis not present

## 2022-06-19 DIAGNOSIS — M6281 Muscle weakness (generalized): Secondary | ICD-10-CM | POA: Diagnosis not present

## 2022-06-19 DIAGNOSIS — Z9181 History of falling: Secondary | ICD-10-CM | POA: Diagnosis not present

## 2022-06-19 DIAGNOSIS — R262 Difficulty in walking, not elsewhere classified: Secondary | ICD-10-CM | POA: Diagnosis not present

## 2022-06-20 DIAGNOSIS — Z9181 History of falling: Secondary | ICD-10-CM | POA: Diagnosis not present

## 2022-06-20 DIAGNOSIS — R2681 Unsteadiness on feet: Secondary | ICD-10-CM | POA: Diagnosis not present

## 2022-06-20 DIAGNOSIS — R262 Difficulty in walking, not elsewhere classified: Secondary | ICD-10-CM | POA: Diagnosis not present

## 2022-06-20 DIAGNOSIS — M6281 Muscle weakness (generalized): Secondary | ICD-10-CM | POA: Diagnosis not present

## 2022-06-20 DIAGNOSIS — R1310 Dysphagia, unspecified: Secondary | ICD-10-CM | POA: Diagnosis not present

## 2022-06-20 DIAGNOSIS — R41841 Cognitive communication deficit: Secondary | ICD-10-CM | POA: Diagnosis not present

## 2022-06-21 DIAGNOSIS — R1310 Dysphagia, unspecified: Secondary | ICD-10-CM | POA: Diagnosis not present

## 2022-06-21 DIAGNOSIS — R262 Difficulty in walking, not elsewhere classified: Secondary | ICD-10-CM | POA: Diagnosis not present

## 2022-06-21 DIAGNOSIS — R2681 Unsteadiness on feet: Secondary | ICD-10-CM | POA: Diagnosis not present

## 2022-06-21 DIAGNOSIS — Z9181 History of falling: Secondary | ICD-10-CM | POA: Diagnosis not present

## 2022-06-21 DIAGNOSIS — R41841 Cognitive communication deficit: Secondary | ICD-10-CM | POA: Diagnosis not present

## 2022-06-21 DIAGNOSIS — M6281 Muscle weakness (generalized): Secondary | ICD-10-CM | POA: Diagnosis not present

## 2022-06-22 DIAGNOSIS — F32A Depression, unspecified: Secondary | ICD-10-CM | POA: Diagnosis not present

## 2022-06-22 DIAGNOSIS — F03C0 Unspecified dementia, severe, without behavioral disturbance, psychotic disturbance, mood disturbance, and anxiety: Secondary | ICD-10-CM | POA: Diagnosis not present

## 2022-06-22 DIAGNOSIS — K055 Other periodontal diseases: Secondary | ICD-10-CM | POA: Diagnosis not present

## 2022-06-22 DIAGNOSIS — I10 Essential (primary) hypertension: Secondary | ICD-10-CM | POA: Diagnosis not present

## 2022-06-26 DIAGNOSIS — R4182 Altered mental status, unspecified: Secondary | ICD-10-CM | POA: Diagnosis not present

## 2022-06-26 DIAGNOSIS — F0390 Unspecified dementia without behavioral disturbance: Secondary | ICD-10-CM | POA: Diagnosis not present

## 2022-06-26 DIAGNOSIS — G9389 Other specified disorders of brain: Secondary | ICD-10-CM | POA: Diagnosis not present

## 2022-06-26 DIAGNOSIS — Z8673 Personal history of transient ischemic attack (TIA), and cerebral infarction without residual deficits: Secondary | ICD-10-CM | POA: Diagnosis not present

## 2022-07-03 DIAGNOSIS — G301 Alzheimer's disease with late onset: Secondary | ICD-10-CM | POA: Diagnosis not present

## 2022-07-03 DIAGNOSIS — F331 Major depressive disorder, recurrent, moderate: Secondary | ICD-10-CM | POA: Diagnosis not present

## 2022-07-03 DIAGNOSIS — F5101 Primary insomnia: Secondary | ICD-10-CM | POA: Diagnosis not present

## 2022-07-03 DIAGNOSIS — F02C3 Dementia in other diseases classified elsewhere, severe, with mood disturbance: Secondary | ICD-10-CM | POA: Diagnosis not present

## 2022-07-03 DIAGNOSIS — F419 Anxiety disorder, unspecified: Secondary | ICD-10-CM | POA: Diagnosis not present

## 2022-07-23 DEATH — deceased

## 2023-09-11 IMAGING — DX DG ABD PORTABLE 1V
1 series · 1 of 1 positions shown · non-contrast
Comparison: None Available.

CLINICAL DATA: [id number] to check for the feeding tube placement

EXAM:
PORTABLE ABDOMEN - 1 VIEW

[abdomen]
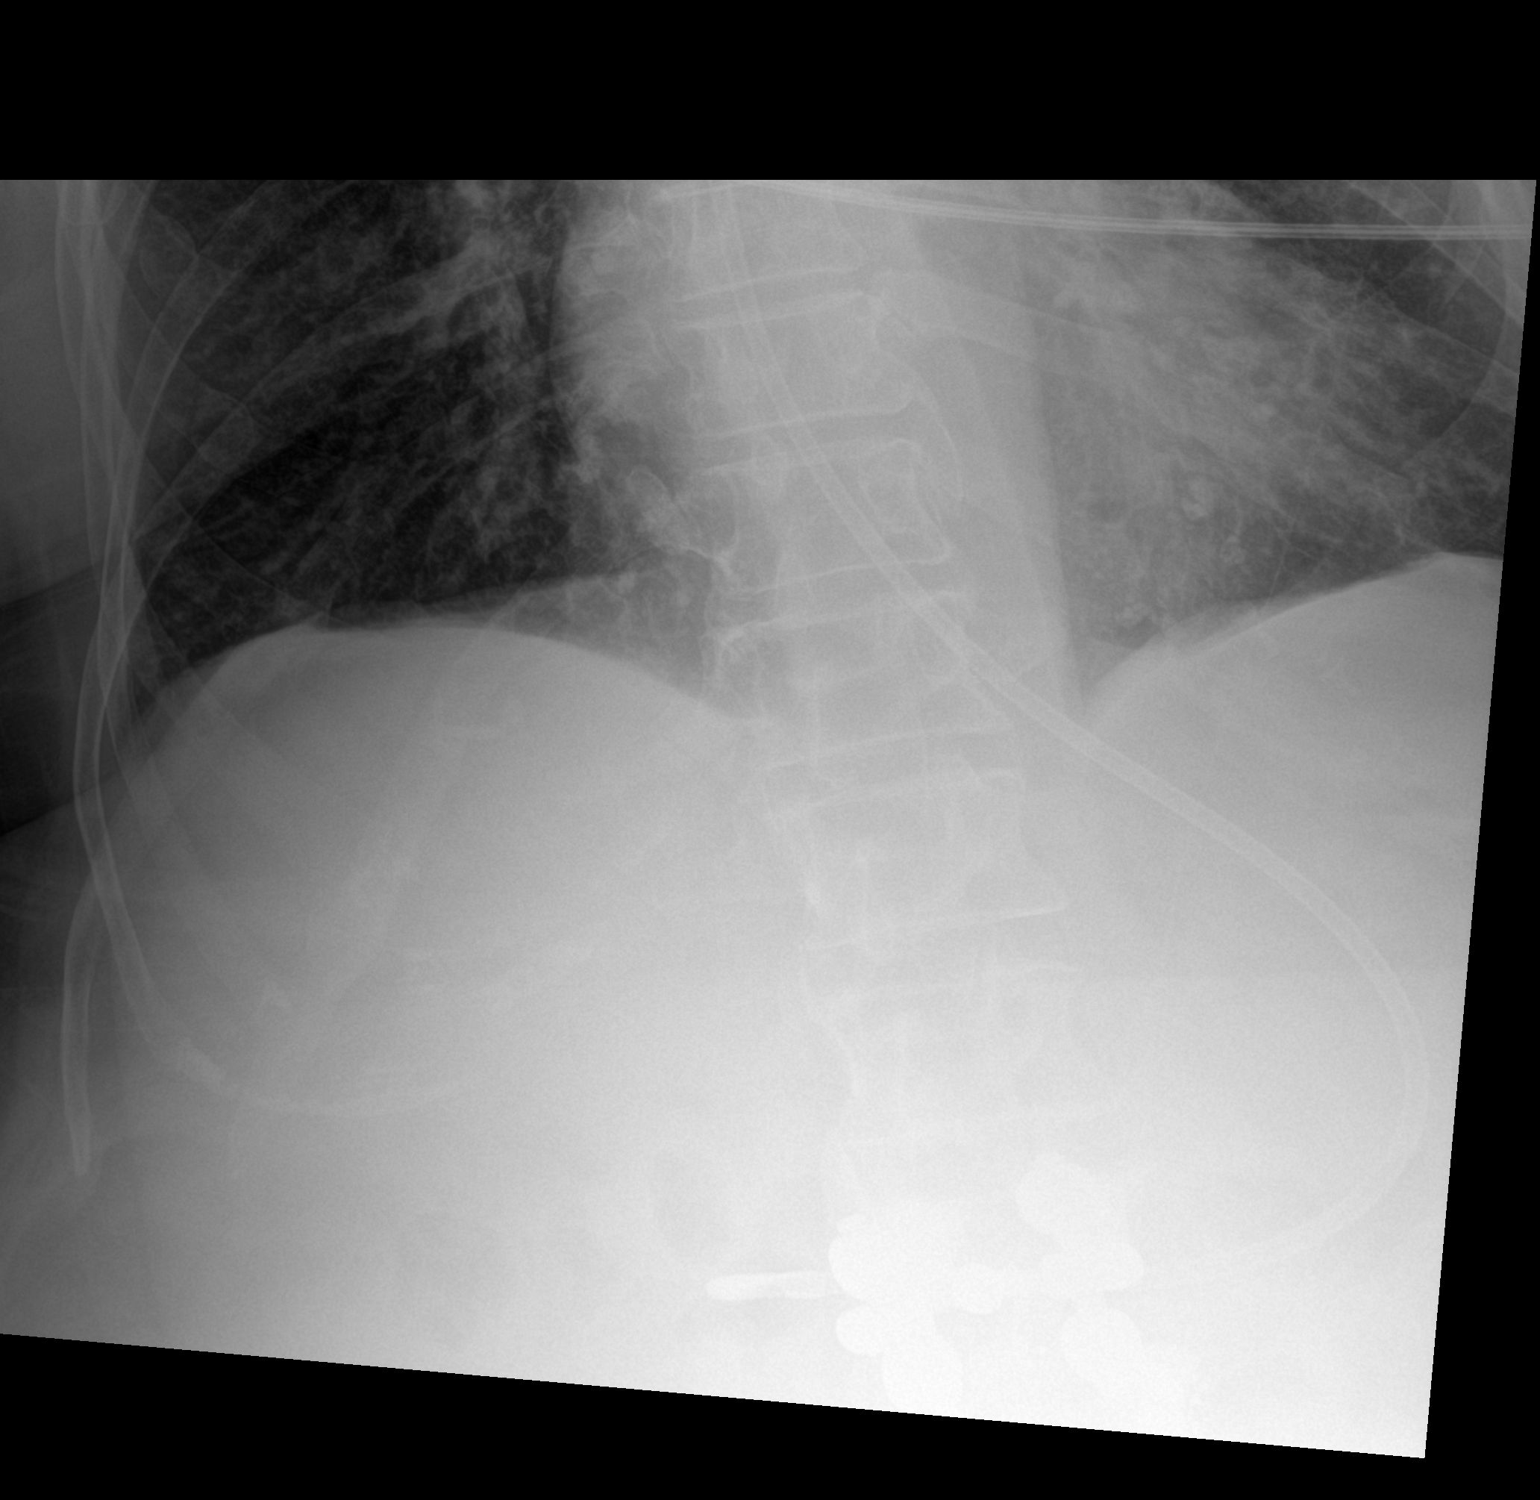

[1 of 1 positions shown; findings below may reference images not displayed]

FINDINGS: There is a feeding tube with its tip at the pyloric region.
Bowel-gas pattern is unremarkable in the partially visualized upper
abdomen. Visualized lung fields are unremarkable.
IMPRESSION: Tip of the feeding tube seen at the pyloric region.
